# Patient Record
Sex: Female | Born: 1985 | Race: Black or African American | Hispanic: No | Marital: Single | Smoking: Never smoker
Health system: Southern US, Community
[De-identification: ages and names within clinical notes are randomized; demographics above are authoritative.]

## PROBLEM LIST (undated history)

## (undated) DIAGNOSIS — D649 Anemia, unspecified: Secondary | ICD-10-CM

## (undated) DIAGNOSIS — M0820 Juvenile rheumatoid arthritis with systemic onset, unspecified site: Secondary | ICD-10-CM

## (undated) DIAGNOSIS — M069 Rheumatoid arthritis, unspecified: Secondary | ICD-10-CM

## (undated) HISTORY — PX: NO PAST SURGERIES: SHX2092

---

## 2005-11-19 ENCOUNTER — Emergency Department (HOSPITAL_COMMUNITY): Admission: EM | Admit: 2005-11-19 | Discharge: 2005-11-19 | Payer: Self-pay | Admitting: Plastic Surgery
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
## 2016-02-19 LAB — BASIC METABOLIC PANEL: Glucose: 128 mg/dL

## 2019-10-02 DIAGNOSIS — Z20828 Contact with and (suspected) exposure to other viral communicable diseases: Secondary | ICD-10-CM | POA: Diagnosis not present

## 2021-10-04 ENCOUNTER — Encounter (HOSPITAL_BASED_OUTPATIENT_CLINIC_OR_DEPARTMENT_OTHER): Payer: Self-pay | Admitting: Emergency Medicine

## 2021-10-04 ENCOUNTER — Other Ambulatory Visit: Payer: Self-pay

## 2021-10-04 ENCOUNTER — Emergency Department (HOSPITAL_BASED_OUTPATIENT_CLINIC_OR_DEPARTMENT_OTHER)
Admission: EM | Admit: 2021-10-04 | Discharge: 2021-10-04 | Disposition: A | Discharge status: Home / Self Care | Payer: Medicaid Other | Attending: Emergency Medicine | Admitting: Emergency Medicine

## 2021-10-04 DIAGNOSIS — Z20822 Contact with and (suspected) exposure to covid-19: Secondary | ICD-10-CM | POA: Insufficient documentation

## 2021-10-04 DIAGNOSIS — Z3201 Encounter for pregnancy test, result positive: Secondary | ICD-10-CM

## 2021-10-04 DIAGNOSIS — O26891 Other specified pregnancy related conditions, first trimester: Secondary | ICD-10-CM | POA: Diagnosis not present

## 2021-10-04 DIAGNOSIS — O219 Vomiting of pregnancy, unspecified: Secondary | ICD-10-CM | POA: Diagnosis present

## 2021-10-04 DIAGNOSIS — Z3A00 Weeks of gestation of pregnancy not specified: Secondary | ICD-10-CM | POA: Insufficient documentation

## 2021-10-04 DIAGNOSIS — R197 Diarrhea, unspecified: Secondary | ICD-10-CM | POA: Diagnosis not present

## 2021-10-04 DIAGNOSIS — R112 Nausea with vomiting, unspecified: Secondary | ICD-10-CM

## 2021-10-04 LAB — COMPREHENSIVE METABOLIC PANEL
ALT: 11 U/L (ref 0–44)
AST: 20 U/L (ref 15–41)
Albumin: 4.7 g/dL (ref 3.5–5.0)
Alkaline Phosphatase: 55 U/L (ref 38–126)
Anion gap: 11 (ref 5–15)
BUN: 8 mg/dL (ref 6–20)
CO2: 25 mmol/L (ref 22–32)
Calcium: 10.2 mg/dL (ref 8.9–10.3)
Chloride: 100 mmol/L (ref 98–111)
Creatinine, Ser: 0.69 mg/dL (ref 0.44–1.00)
GFR, Estimated: >60 mL/min (ref >60)
Glucose, Bld: 83 mg/dL (ref 70–99)
Potassium: 4 mmol/L (ref 3.5–5.1)
Sodium: 136 mmol/L (ref 135–145)
Total Bilirubin: 0.4 mg/dL (ref 0.3–1.2)
Total Protein: 8.4 g/dL — ABNORMAL HIGH (ref 6.5–8.1)

## 2021-10-04 LAB — URINALYSIS, ROUTINE W REFLEX MICROSCOPIC
Bilirubin Urine: NEGATIVE
Glucose, UA: NEGATIVE mg/dL
Hgb urine dipstick: NEGATIVE
Ketones, ur: >80 mg/dL — AB
Nitrite: NEGATIVE
Protein, ur: 30 mg/dL — AB
Specific Gravity, Urine: 1.034 — ABNORMAL HIGH (ref 1.005–1.030)
pH: 6 (ref 5.0–8.0)

## 2021-10-04 LAB — CBC
HCT: 40.2 % (ref 36.0–46.0)
Hemoglobin: 13.7 g/dL (ref 12.0–15.0)
MCH: 28 pg (ref 26.0–34.0)
MCHC: 34.1 g/dL (ref 30.0–36.0)
MCV: 82 fL (ref 80.0–100.0)
Platelets: 286 10*3/uL (ref 150–400)
RBC: 4.9 MIL/uL (ref 3.87–5.11)
RDW: 14.9 % (ref 11.5–15.5)
WBC: 9.7 10*3/uL (ref 4.0–10.5)
nRBC: 0 % (ref 0.0–0.2)

## 2021-10-04 LAB — RESP PANEL BY RT-PCR (FLU A&B, COVID) ARPGX2
Influenza A by PCR: NEGATIVE
Influenza B by PCR: NEGATIVE
SARS Coronavirus 2 by RT PCR: NEGATIVE

## 2021-10-04 LAB — PREGNANCY, URINE: Preg Test, Ur: POSITIVE — AB

## 2021-10-04 LAB — LIPASE, BLOOD: Lipase: <10 U/L — ABNORMAL LOW (ref 11–51)

## 2021-10-04 MED ORDER — ONDANSETRON HCL 4 MG/2ML IJ SOLN
4.0000 mg | Freq: Once | INTRAMUSCULAR | Status: AC
  Administered 2021-10-04: 4 mg via INTRAVENOUS
  Filled 2021-10-04: qty 2

## 2021-10-04 MED ORDER — LACTATED RINGERS IV BOLUS
1000.0000 mL | Freq: Once | INTRAVENOUS | Status: AC
  Administered 2021-10-04: 1000 mL via INTRAVENOUS

## 2021-10-04 MED ORDER — ONDANSETRON 4 MG PO TBDP: 4.0000 mg | ORAL_TABLET | Freq: Three times a day (TID) | ORAL | 0 refills | Status: DC | PRN

## 2021-10-04 MED ORDER — ACETAMINOPHEN 325 MG PO TABS
650.0000 mg | ORAL_TABLET | Freq: Once | ORAL | Status: AC
Start: 2021-10-04 — End: 2021-10-04
  Administered 2021-10-04: 650 mg via ORAL
  Filled 2021-10-04: qty 2

## 2021-10-04 NOTE — Discharge Instructions (Addendum)
Use zofran as needed for nausea/vomiting Make sure you stay well hydrated with water Use tylenol as needed for pain. Avoid medicines such as ibuprofen in pregnancy You should establish care with an ob/gyn. You may re-establish with your provider in [place] or contact the clinic listed below to establish care.  Return to the ER if you develop abdominal pain, vaginal bleeding, or inability to keep fluids down despite medication. Return with any new, worsening, or concerning symptoms.

## 2021-10-04 NOTE — ED Notes (Signed)
Pt was given Ginger-ale for PO challenge.

## 2021-10-04 NOTE — ED Triage Notes (Signed)
Pt arrived POV, caox4, ambulatory. Pt c/o N/V, diarrhea since yesterday, pt also c/o headache. Pt reports son had stomach virus with diarrhea and vomiting last week.

## 2021-10-04 NOTE — ED Notes (Signed)
Pt was able to tolerate po ginger ale

## 2021-10-04 NOTE — ED Provider Notes (Signed)
[facility] EMERGENCY DEPT Provider Note   CSN: [id number] Arrival date & time: 10/04/21  1603     History  Chief Complaint  Patient presents with   Emesis    Melissa Little is a 36 y.o. female presenting for evaluation of nausea, vomiting, diarrhea, and headache.   Pt states there has been a stomach bug going around her family. Yesterday she developed n/v/d. She reports ~10 episodes of diarrhea in the last 24 hrs. She has not vomited since 4am. She denies fevers, cough, nasal congestion, abd pain, urinary sxs. She has not taken anything for her sxs. She developed a HA today, prompting her visit. She has not had anything to eat/drink since yesterday.  She is sexually active. Her LMP was ~1 month ago. She denies vaginal bleeding, lower abd pain, or vaginal discharge.   tr       Home Medications Prior to Admission medications   Medication Sig Start Date End Date Taking? Authorizing Provider  ondansetron (ZOFRAN-ODT) 4 MG disintegrating tablet Take 1 tablet (4 mg total) by mouth every 8 (eight) hours as needed for nausea or vomiting. 10/04/21  Yes Govani Radloff, PA-C      Allergies    Patient has no known allergies.    Review of Systems   Review of Systems  Constitutional:  Negative for fever.  Gastrointestinal:  Positive for diarrhea, nausea and vomiting. Negative for abdominal pain.  Genitourinary:  Negative for dysuria, frequency, pelvic pain, vaginal bleeding, vaginal discharge and vaginal pain.  Neurological:  Positive for headaches.  All other systems reviewed and are negative.   Physical Exam Updated Vital Signs BP 116/62   Pulse 79   Temp (!) 97.5 F (36.4 C)   Resp 18   Ht 5\' 3"  (1.6 m)   Wt 58.1 kg   LMP 09/07/2021 (Approximate)   SpO2 100%   BMI 22.67 kg/m  Physical Exam Vitals and nursing note reviewed.  Constitutional:      General: She is not in acute distress.    Appearance: Normal appearance.     Comments: Resting in the bed in  no acute distress  HENT:     Head: Normocephalic and atraumatic.     Mouth/Throat:     Mouth: Mucous membranes are moist.  Eyes:     Extraocular Movements: Extraocular movements intact.     Conjunctiva/sclera: Conjunctivae normal.     Pupils: Pupils are equal, round, and reactive to light.  Cardiovascular:     Rate and Rhythm: Normal rate and regular rhythm.     Pulses: Normal pulses.  Pulmonary:     Effort: Pulmonary effort is normal. No respiratory distress.     Breath sounds: Normal breath sounds. No wheezing.     Comments: Speaking in full sentences.  Clear lung sounds in all fields. Abdominal:     General: There is no distension.     Palpations: Abdomen is soft. There is no mass.     Tenderness: There is no abdominal tenderness. There is no guarding or rebound.     Comments: No ttp of the abd. No rigdity, guarding, distention   Musculoskeletal:        General: Normal range of motion.     Cervical back: Normal range of motion and neck supple.  Skin:    General: Skin is warm and dry.     Capillary Refill: Capillary refill takes less than 2 seconds.  Neurological:     Mental Status: She is alert and oriented  to person, place, and time.  Psychiatric:        Mood and Affect: Mood and affect normal.        Speech: Speech normal.        Behavior: Behavior normal.     ED Results / Procedures / Treatments   Labs (all labs ordered are listed, but only abnormal results are displayed) Labs Reviewed  LIPASE, BLOOD - Abnormal; Notable for the following components:      Result Value   Lipase <10 (*)    All other components within normal limits  COMPREHENSIVE METABOLIC PANEL - Abnormal; Notable for the following components:   Total Protein 8.4 (*)    All other components within normal limits  URINALYSIS, ROUTINE W REFLEX MICROSCOPIC - Abnormal; Notable for the following components:   Specific Gravity, Urine 1.034 (*)    Ketones, ur >80 (*)    Protein, ur 30 (*)     Leukocytes,Ua SMALL (*)    All other components within normal limits  PREGNANCY, URINE - Abnormal; Notable for the following components:   Preg Test, Ur POSITIVE (*)    All other components within normal limits  RESP PANEL BY RT-PCR (FLU A&B, COVID) [id number]  URINE CULTURE  CBC    EKG None  Radiology No results found.  Procedures Procedures    Medications Ordered in ED Medications  ondansetron (ZOFRAN) injection 4 mg (4 mg Intravenous Given 10/04/21 1740)  lactated ringers bolus 1,000 mL (0 mLs Intravenous Stopped 10/04/21 1847)  acetaminophen (TYLENOL) tablet 650 mg (650 mg Oral Given 10/04/21 1839)    ED Course/ Medical Decision Making/ A&P                           Medical Decision Making Amount and/or Complexity of Data Reviewed Labs: ordered.  Risk OTC drugs. Prescription drug management.    This patient presents to the ED for concern of n/v/d. This involves an extensive number of treatment options, and is a complaint that carries with it a high risk of complications and morbidity.  The differential diagnosis includes gastroenteritis, pancreatitis, cholelithiasis, UTI, pregnancy   Co morbidities:  none    Lab Tests:  I ordered, and personally interpreted labs.  The pertinent results include:  urine pregnancy is positive. Pt informed fo this result. This is likely contributing to her sxs today.  Overall remaining labs are reassuring. No concerning kidney, liver, or pancreatic dysfunction. UA without obvious infection, and pt without sxs. However considering current pregnancy, will send for urine cx. Covid and flu negative  Medicines ordered:  I ordered medication including Zofran and fluids for symptomatic control Reevaluation of the patient after these medicines showed that the patient improved I have reviewed the patients home medicines and have made adjustments as needed   Test Considered:  no [place] ordered today as pt without sxs concerning for  miscarriage, ectopic or other pregnancy related emergencies.  No need for CT, MRI, or [place] of abd as pt is without fever or abd pain that could indicate intraabdominal infection or surgical emergency.   Reevaluation:  After the interventions noted above, I reevaluated the patient and found that they have :improved. She is able to tolerate fluids without difficulty    Disposition:  After consideration of the diagnostic results and the patients response to treatment, I feel that the patent would benefit from continue symptomatic tx at home. Discussed use of tylenol for headache, zofran prn for nausea.  Encourage pt to establish with ob/gyn, resources given. At this time, pt appears safe for d/c. Return precautions given. Pt states she understands and agrees to plan.          Final Clinical Impression(s) / ED Diagnoses Final diagnoses:  Nausea vomiting and diarrhea  Positive pregnancy test    Rx / DC Orders ED Discharge Orders          Ordered    ondansetron (ZOFRAN-ODT) 4 MG disintegrating tablet  Every 8 hours PRN        10/04/21 1917              Franchot Heidelberg, PA-C 10/04/21 2008    Wyvonnia Dusky, MD 10/05/21 [phone]

## 2021-10-06 LAB — URINE CULTURE: Culture: NO GROWTH

## 2022-01-18 NOTE — L&D Delivery Note (Addendum)
OB/GYN Faculty Practice Delivery Note  Melissa Little is a 37 y.o. [id number] s/p SVD at [redacted]w[redacted]d. She was admitted for marginal placental abruption.   ROM: rupture date, rupture time, delivery date, or delivery time have not been documented with blood tinged fluid GBS Status: unknown but on PPX   Maximum Maternal Temperature: 98.31F  Labor Progress: Initial SVE: 4/60/-2. She then progressed to complete.   Delivery Date/Time: 1556 11/14 Delivery: Called to room and patient was complete and pushing. Head delivered DOA and as the head was delivering there was a gush of port wine blood stained fluid, presumably SROM and abruption products. No nuchal cord present. Shoulder and body delivered in usual fashion. Infant with spontaneous cry, placed on mother's abdomen, dried and stimulated. Cord clamped x 2 after 1-minute delay, and cut by Clinical research associate. Cord blood drawn. Placenta delivered spontaneously with gentle cord traction and inspected and found to have the following: 3 vessel cord but two distinct 3cm areas close to the margin of adherent clot. Fundus firm with massage, TXA, and Pitocin, noted ot have dark clot expelled with fundal massage. A lower uterine sweep preformed with minimal clot, however a 5" piece of trailing membrane also removed by using fingers to bring the membrane to introitus and then ring forceps to evacuate the membranes further.  A second lower uterine sweep preformed with no other clot or retained products appreciated. Labia, perineum, vagina, and cervix inspected with hemostatic first degree perineal laceration. Mom and baby doing well.  Mom confirms she still wants PP BTL for contraception, will work with OR staff to coordinate.  Last PO intake 11/13 and jello.    Baby Weight: pending  Placenta: 3 vessel, intact. Sent to Pathology.  Complications: None Lacerations: as above EBL: 373 mL Analgesia: Epidural   Infant:  APGAR (1 MIN):  8 APGAR (5 MINS):  9  Melissa Dibble, MD [facility]  Medicine Fellow, [facility] for [facility], [facility] 12/02/2022, 4:19 PM

## 2022-03-15 DIAGNOSIS — F419 Anxiety disorder, unspecified: Secondary | ICD-10-CM | POA: Insufficient documentation

## 2022-06-30 ENCOUNTER — Encounter (HOSPITAL_COMMUNITY): Payer: Self-pay

## 2022-06-30 ENCOUNTER — Other Ambulatory Visit: Payer: Self-pay

## 2022-06-30 ENCOUNTER — Emergency Department (HOSPITAL_COMMUNITY): Payer: Medicaid Other

## 2022-06-30 ENCOUNTER — Emergency Department (HOSPITAL_COMMUNITY)
Admission: EM | Admit: 2022-06-30 | Discharge: 2022-06-30 | Disposition: A | Discharge status: Home / Self Care | Payer: Medicaid Other | Attending: Emergency Medicine | Admitting: Emergency Medicine

## 2022-06-30 DIAGNOSIS — Z20822 Contact with and (suspected) exposure to covid-19: Secondary | ICD-10-CM | POA: Insufficient documentation

## 2022-06-30 DIAGNOSIS — B9689 Other specified bacterial agents as the cause of diseases classified elsewhere: Secondary | ICD-10-CM | POA: Insufficient documentation

## 2022-06-30 DIAGNOSIS — E876 Hypokalemia: Secondary | ICD-10-CM

## 2022-06-30 DIAGNOSIS — O2341 Unspecified infection of urinary tract in pregnancy, first trimester: Secondary | ICD-10-CM | POA: Insufficient documentation

## 2022-06-30 DIAGNOSIS — M791 Myalgia, unspecified site: Secondary | ICD-10-CM | POA: Diagnosis not present

## 2022-06-30 DIAGNOSIS — Z3A13 13 weeks gestation of pregnancy: Secondary | ICD-10-CM | POA: Insufficient documentation

## 2022-06-30 DIAGNOSIS — Z3490 Encounter for supervision of normal pregnancy, unspecified, unspecified trimester: Secondary | ICD-10-CM

## 2022-06-30 DIAGNOSIS — O2340 Unspecified infection of urinary tract in pregnancy, unspecified trimester: Secondary | ICD-10-CM

## 2022-06-30 DIAGNOSIS — O26891 Other specified pregnancy related conditions, first trimester: Secondary | ICD-10-CM | POA: Diagnosis present

## 2022-06-30 LAB — COMPREHENSIVE METABOLIC PANEL
ALT: 12 U/L (ref 0–44)
AST: 23 U/L (ref 15–41)
Albumin: 3.2 g/dL — ABNORMAL LOW (ref 3.5–5.0)
Alkaline Phosphatase: 47 U/L (ref 38–126)
Anion gap: 8 (ref 5–15)
BUN: 5 mg/dL — ABNORMAL LOW (ref 6–20)
CO2: 22 mmol/L (ref 22–32)
Calcium: 8.6 mg/dL — ABNORMAL LOW (ref 8.9–10.3)
Chloride: 99 mmol/L (ref 98–111)
Creatinine, Ser: 0.46 mg/dL (ref 0.44–1.00)
GFR, Estimated: >60 mL/min (ref >60)
Glucose, Bld: 87 mg/dL (ref 70–99)
Potassium: 2.9 mmol/L — ABNORMAL LOW (ref 3.5–5.1)
Sodium: 129 mmol/L — ABNORMAL LOW (ref 135–145)
Total Bilirubin: 0.7 mg/dL (ref 0.3–1.2)
Total Protein: 7.3 g/dL (ref 6.5–8.1)

## 2022-06-30 LAB — I-STAT BETA HCG BLOOD, ED (MC, WL, AP ONLY): I-stat hCG, quantitative: >2000 m[IU]/mL — ABNORMAL HIGH (ref <5)

## 2022-06-30 LAB — URINALYSIS, ROUTINE W REFLEX MICROSCOPIC
Bilirubin Urine: NEGATIVE
Glucose, UA: >=500 mg/dL — AB
Ketones, ur: 20 mg/dL — AB
Nitrite: NEGATIVE
Protein, ur: 30 mg/dL — AB
Specific Gravity, Urine: 1.02 (ref 1.005–1.030)
pH: 5 (ref 5.0–8.0)

## 2022-06-30 LAB — CBC WITH DIFFERENTIAL/PLATELET
Abs Immature Granulocytes: 0.06 10*3/uL (ref 0.00–0.07)
Basophils Absolute: 0.1 10*3/uL (ref 0.0–0.1)
Basophils Relative: 1 %
Eosinophils Absolute: 0 10*3/uL (ref 0.0–0.5)
Eosinophils Relative: 0 %
HCT: 31.1 % — ABNORMAL LOW (ref 36.0–46.0)
Hemoglobin: 10.7 g/dL — ABNORMAL LOW (ref 12.0–15.0)
Immature Granulocytes: 1 %
Lymphocytes Relative: 20 %
Lymphs Abs: 1.1 10*3/uL (ref 0.7–4.0)
MCH: 27 pg (ref 26.0–34.0)
MCHC: 34.4 g/dL (ref 30.0–36.0)
MCV: 78.5 fL — ABNORMAL LOW (ref 80.0–100.0)
Monocytes Absolute: 0.7 10*3/uL (ref 0.1–1.0)
Monocytes Relative: 14 %
Neutro Abs: 3.4 10*3/uL (ref 1.7–7.7)
Neutrophils Relative %: 64 %
Platelets: 187 10*3/uL (ref 150–400)
RBC: 3.96 MIL/uL (ref 3.87–5.11)
RDW: 14.5 % (ref 11.5–15.5)
WBC: 5.3 10*3/uL (ref 4.0–10.5)
nRBC: 0 % (ref 0.0–0.2)

## 2022-06-30 LAB — HCG, QUANTITATIVE, PREGNANCY: hCG, Beta Chain, Quant, S: 47350 m[IU]/mL — ABNORMAL HIGH (ref <5)

## 2022-06-30 LAB — CBG MONITORING, ED: Glucose-Capillary: 102 mg/dL — ABNORMAL HIGH (ref 70–99)

## 2022-06-30 LAB — SARS CORONAVIRUS 2 BY RT PCR: SARS Coronavirus 2 by RT PCR: NEGATIVE

## 2022-06-30 MED ORDER — SODIUM CHLORIDE 0.9 % IV BOLUS
1000.0000 mL | Freq: Once | INTRAVENOUS | Status: AC
  Administered 2022-06-30: 1000 mL via INTRAVENOUS

## 2022-06-30 MED ORDER — DOXYLAMINE-PYRIDOXINE 10-10 MG PO TBEC: 2.0000 | DELAYED_RELEASE_TABLET | Freq: Every day | ORAL | 0 refills | Status: DC

## 2022-06-30 MED ORDER — ONDANSETRON HCL 4 MG/2ML IJ SOLN
4.0000 mg | Freq: Once | INTRAMUSCULAR | Status: AC
  Administered 2022-06-30: 4 mg via INTRAVENOUS
  Filled 2022-06-30: qty 2

## 2022-06-30 MED ORDER — ACETAMINOPHEN 325 MG PO TABS
650.0000 mg | ORAL_TABLET | Freq: Once | ORAL | Status: AC
  Administered 2022-06-30: 650 mg via ORAL
  Filled 2022-06-30: qty 2

## 2022-06-30 MED ORDER — POTASSIUM CHLORIDE CRYS ER 20 MEQ PO TBCR
40.0000 meq | EXTENDED_RELEASE_TABLET | Freq: Once | ORAL | Status: AC
  Administered 2022-06-30: 40 meq via ORAL
  Filled 2022-06-30: qty 2

## 2022-06-30 MED ORDER — CEFADROXIL 500 MG PO CAPS: 500.0000 mg | ORAL_CAPSULE | Freq: Two times a day (BID) | ORAL | 0 refills | Status: DC

## 2022-06-30 MED ORDER — SODIUM CHLORIDE 0.9 % IV SOLN
1.0000 g | Freq: Once | INTRAVENOUS | Status: AC
  Administered 2022-06-30: 1 g via INTRAVENOUS
  Filled 2022-06-30: qty 10

## 2022-06-30 NOTE — ED Notes (Signed)
Pt ambulatory to the restroom.  

## 2022-06-30 NOTE — ED Triage Notes (Signed)
Pt states she woke up today experiencing chills, body aches, and nausea/vomiting. Denies being around anyone sick that she is aware of.

## 2022-06-30 NOTE — ED Provider Notes (Signed)
Physical Exam  BP 109/65   Pulse 76   Temp 98.6 F (37 C) (Oral)   Resp 18   Ht 5\' 2"  (1.575 m)   Wt 54.9 kg   LMP 05/26/2022   SpO2 100%   BMI 22.13 kg/m   Physical Exam Vitals and nursing note reviewed.  Constitutional:      General: She is not in acute distress.    Appearance: She is well-developed.  HENT:     Head: Normocephalic and atraumatic.  Eyes:     Conjunctiva/sclera: Conjunctivae normal.  Cardiovascular:     Rate and Rhythm: Normal rate and regular rhythm.     Heart sounds: No murmur heard. Pulmonary:     Effort: Pulmonary effort is normal. No respiratory distress.     Breath sounds: Normal breath sounds.  Abdominal:     Palpations: Abdomen is soft.     Tenderness: There is no abdominal tenderness. There is no guarding.  Musculoskeletal:        General: No swelling.     Cervical back: Neck supple.  Skin:    General: Skin is warm and dry.     Capillary Refill: Capillary refill takes less than 2 seconds.  Neurological:     Mental Status: She is alert.  Psychiatric:        Mood and Affect: Mood normal.     Procedures  Procedures  ED Course / MDM    Medical Decision Making Amount and/or Complexity of Data Reviewed Labs: ordered. Radiology: ordered.  Risk OTC drugs. Prescription drug management.   37 year old female presents emergency department with complaints of nausea, vomiting.  Patient care handed off from [facility] at shift change, see prior note for more full details.  In short, awoke this morning with bodyaches, chills, 2 episodes of nonbloody emesis earlier today.  States her last menstrual period ended 5 to 6 weeks ago.  G3 P2 with positive at-home pregnancy test today.  Denies abdominal pain, fever, chills, urinary/vaginal symptoms, change in bowel habits.  Awaiting ultrasound imaging with expected discharge in the outpatient setting with follow-up with OB/GYN.  Laboratory studies: No leukocytosis.  Patient with evidence of anemia  microcytic in nature of 10.7 of which is decreased in patient's baseline.  No platelet abnormalities.  Patient with multiple electrolyte abnormalities including hyponatremia, hypokalemia and hypocalcemia of 129, 2.98.6 respectively supplemented via IV fluids and oral potassium chloride.  No transaminitis.  No renal dysfunction.  UA significant for urinary tract infection with rare bacteria, 11-20 RBCs with moderate leukocytes.  Beta-hCG elevated to 47,000, 350.  Imaging studies: Single live intrauterine pregnancy measuring 13 weeks and 5 days with no complication  Nausea, vomiting, intrauterine pregnancy Vital signs within normal range and stable throughout visit Laboratory/imaging studies: See above 37 year old female presents emergency department with nausea, vomiting that began this morning.  Patient without abdominal pain or tenderness on exam throughout visit.  Patient found with positive pregnancy test with intrauterine pregnancy confirmed on ultrasound imaging without complication.  Patient states that she has had "morning sickness" with her 2 prior pregnancies and states this feels similarly.  Was treated with Zofran while in the ED with subsequent passage of p.o. trial with no repeat bouts of emesis.  Plan to discharge with antiemetic as well as antibiotics given evidence of current urinary tract infection although asymptomatic at this time.  Will recommend prenatal vitamins and abstinence from substances/medications that can complicate pregnancy.  Close follow-up with OB/GYN recommended in outpatient setting for reevaluation.  Treatment plan discussed at length with patient and she acknowledged understanding was agreeable to said plan.  Patient overall well-appearing, afebrile in no acute distress. Worrisome signs and symptoms were discussed with the patient and the patient acknowledged understanding to return to emergency department if notice.  Patient stable upon discharge.          Peter Garter, [place] 06/30/22 2332    Gwyneth Sprout, MD 07/01/22 [phone]

## 2022-06-30 NOTE — ED Provider Notes (Signed)
[place] EMERGENCY DEPARTMENT AT [facility] Provider Note   CSN: [id number] Arrival date & time: 06/30/22  1759     History  Chief Complaint  Patient presents with   Nausea    Melissa Little is a 37 y.o. female.  37 year old female with no significant past medical history present with complaint of chills, body aches, nausea and vomiting x 2 episodes of nonbloody emesis today.  No known sick contacts, no recent travel.  LMP about 5 to 6 weeks ago.  Has not taken anything for her symptoms today. G3P2 (as of + test today).        Home Medications Prior to Admission medications   Medication Sig Start Date End Date Taking? Authorizing Provider  ondansetron (ZOFRAN-ODT) 4 MG disintegrating tablet Take 1 tablet (4 mg total) by mouth every 8 (eight) hours as needed for nausea or vomiting. 10/04/21   Caccavale, Sophia, PA-C      Allergies    Patient has no known allergies.    Review of Systems   Review of Systems Negative except as per HPI Physical Exam Updated Vital Signs BP (!) 116/91   Pulse 70   Temp 99.6 F (37.6 C) (Oral)   Resp 18   Ht 5\' 2"  (1.575 m)   Wt 54.9 kg   LMP 05/26/2022   SpO2 100%   BMI 22.13 kg/m  Physical Exam Vitals and nursing note reviewed.  Constitutional:      General: She is not in acute distress.    Appearance: She is well-developed. She is not diaphoretic.  HENT:     Head: Normocephalic and atraumatic.  Eyes:     Conjunctiva/sclera: Conjunctivae normal.  Cardiovascular:     Rate and Rhythm: Normal rate and regular rhythm.     Pulses: Normal pulses.     Heart sounds: Normal heart sounds.  Pulmonary:     Effort: Pulmonary effort is normal.     Breath sounds: Normal breath sounds.  Abdominal:     Palpations: Abdomen is soft.     Tenderness: There is no abdominal tenderness.  Musculoskeletal:     Cervical back: Neck supple.     Right lower leg: No edema.     Left lower leg: No edema.  Lymphadenopathy:     Cervical: No  cervical adenopathy.  Skin:    General: Skin is warm and dry.     Findings: No erythema or rash.  Neurological:     Mental Status: She is alert and oriented to person, place, and time.  Psychiatric:        Behavior: Behavior normal.     ED Results / Procedures / Treatments   Labs (all labs ordered are listed, but only abnormal results are displayed) Labs Reviewed  COMPREHENSIVE METABOLIC PANEL - Abnormal; Notable for the following components:      Result Value   Sodium 129 (*)    Potassium 2.9 (*)    BUN 5 (*)    Calcium 8.6 (*)    Albumin 3.2 (*)    All other components within normal limits  CBC WITH DIFFERENTIAL/PLATELET - Abnormal; Notable for the following components:   Hemoglobin 10.7 (*)    HCT 31.1 (*)    MCV 78.5 (*)    All other components within normal limits  URINALYSIS, ROUTINE W REFLEX MICROSCOPIC - Abnormal; Notable for the following components:   Glucose, UA >=500 (*)    Hgb urine dipstick MODERATE (*)    Ketones, ur 20 (*)  Protein, ur 30 (*)    Leukocytes,Ua MODERATE (*)    Bacteria, UA RARE (*)    All other components within normal limits  I-STAT BETA HCG BLOOD, ED ([facility], [facility], [facility] ONLY) - Abnormal; Notable for the following components:   I-stat hCG, quantitative >2,000.0 (*)    All other components within normal limits  CBG MONITORING, ED - Abnormal; Notable for the following components:   Glucose-Capillary 102 (*)    All other components within normal limits  SARS CORONAVIRUS 2 BY RT PCR  URINE CULTURE  HCG, QUANTITATIVE, PREGNANCY    EKG None  Radiology No results found.  Procedures Procedures    Medications Ordered in ED Medications  cefTRIAXone (ROCEPHIN) 1 g in sodium chloride 0.9 % 100 mL IVPB (has no administration in time range)  potassium chloride SA (KLOR-CON M) CR tablet 40 mEq (has no administration in time range)  sodium chloride 0.9 % bolus 1,000 mL (1,000 mLs Intravenous New Bag/Given 06/30/22 2055)  ondansetron (ZOFRAN)  injection 4 mg (4 mg Intravenous Given 06/30/22 2054)  acetaminophen (TYLENOL) tablet 650 mg (650 mg Oral Given 06/30/22 2055)    ED Course/ Medical Decision Making/ A&P                             Medical Decision Making Amount and/or Complexity of Data Reviewed Labs: ordered.  Risk OTC drugs. Prescription drug management.   This patient presents to the ED for concern of chills, body aches, vomiting, this involves an extensive number of treatment options, and is a complaint that carries with it a high risk of complications and morbidity.  The differential diagnosis includes but not limited to viral illness, UTI, pregnancy   Co morbidities that complicate the patient evaluation  Otherwise healthy    Additional history obtained:  External records from outside source obtained and reviewed including prior labs in care everywhere    Lab Tests:  I Ordered, and personally interpreted labs.  The pertinent results include:  I stat Hcg +, formal pending. CBC with normal WBC, hgb 10.7 previously 13.7 8 months ago. Urine with moderate hgb, >500 glucose, leukocytes and rare bacteria. Add on CBG with concern for glucose in the urine is reassuring at 109.    Imaging Studies ordered:  I ordered imaging studies including OB US  Results pending at time of sign out to on coming provider.    Problem List / ED Course / Critical interventions / Medication management  37 yo female with chills, nausea, vomiting, body aches. No abdominal pain, has  been constipated. Found to be pregnant, concern for UTI, treated with rocephin in the ER, likely dc on Keflex. Care signed out at change or shift pending OB US and recheck.  I ordered medication including tylenol, IVF  for body aches, temp 99.7.  Reevaluation of the patient after these medicines showed that the patient  reassessment pending at time of sign out I have reviewed the patients home medicines and have made adjustments as needed   Social  Determinants of Health:  Has PCP   Test / Admission - Considered:  Disposition pending at time of sign out to oncoming provider.          Final Clinical Impression(s) / ED Diagnoses Final diagnoses:  Pregnancy, unspecified gestational age  Urinary tract infection in mother during pregnancy, antepartum  Hypokalemia    Rx / DC Orders ED Discharge Orders     None  Jeannie Fend, PA-C 06/30/22 2155    Gwyneth Sprout, MD 07/01/22 [phone]

## 2022-06-30 NOTE — Discharge Instructions (Addendum)
Note the workup today was overall reassuring.  As discussed, ultrasound findings showed single live intrauterine pregnancy measuring 13 weeks and 5 days without complication.  Your urine did show signs of infection so we will treat this with antibiotics in outpatient setting.  Recommend continuing prenatal vitamins and avoiding medications/foods that can complicate pregnancy.  Recommend close follow-up with OB/GYN in the outpatient setting for reevaluation of symptoms.  Please not hesitate to return to emergency department for worrisome signs and symptoms we discussed become apparent.

## 2022-07-01 LAB — URINE CULTURE: Culture: <10000 — AB

## 2022-07-02 ENCOUNTER — Other Ambulatory Visit (HOSPITAL_BASED_OUTPATIENT_CLINIC_OR_DEPARTMENT_OTHER): Payer: Self-pay | Admitting: Nurse Practitioner

## 2022-07-02 DIAGNOSIS — R590 Localized enlarged lymph nodes: Secondary | ICD-10-CM

## 2022-07-06 ENCOUNTER — Ambulatory Visit (HOSPITAL_BASED_OUTPATIENT_CLINIC_OR_DEPARTMENT_OTHER)
Admission: RE | Admit: 2022-07-06 | Discharge: 2022-07-06 | Disposition: A | Discharge status: Home / Self Care | Payer: Medicaid Other | Source: Ambulatory Visit | Attending: Nurse Practitioner | Admitting: Nurse Practitioner

## 2022-07-06 DIAGNOSIS — R590 Localized enlarged lymph nodes: Secondary | ICD-10-CM | POA: Diagnosis present

## 2022-08-05 ENCOUNTER — Telehealth (INDEPENDENT_AMBULATORY_CARE_PROVIDER_SITE_OTHER): Payer: Medicaid Other

## 2022-08-05 DIAGNOSIS — Z3689 Encounter for other specified antenatal screening: Secondary | ICD-10-CM

## 2022-08-05 DIAGNOSIS — O09529 Supervision of elderly multigravida, unspecified trimester: Secondary | ICD-10-CM | POA: Insufficient documentation

## 2022-08-05 DIAGNOSIS — O0990 Supervision of high risk pregnancy, unspecified, unspecified trimester: Secondary | ICD-10-CM | POA: Insufficient documentation

## 2022-08-05 NOTE — Progress Notes (Signed)
New OB Intake  I connected with Melissa Little  on 08/06/22 at  9:15 AM EDT by MyChart Video Visit and verified that I am speaking with the correct person using two identifiers. Nurse is located at [facility] and pt is located at home.  I discussed the limitations, risks, security and privacy concerns of performing an evaluation and management service by telephone and the availability of in person appointments. I also discussed with the patient that there may be a patient responsible charge related to this service. The patient expressed understanding and agreed to proceed.  I explained I am completing New OB Intake today. We discussed EDD of Not found.. Pt is [id number]. I reviewed her allergies, medications and Medical/Surgical/OB history.    Patient Active Problem List   Diagnosis Date Noted   Supervision of high risk pregnancy, antepartum 08/05/2022   AMA (advanced maternal age) multigravida 35+ 08/05/2022   Anxiety 03/15/2022   Concerns addressed today None  Delivery Plans Plans to deliver at [facility]. Discussed the nature of our practice with multiple providers including residents and students. Due to the size of the practice, the delivering provider may not be the same as those providing prenatal care.   Patient is not interested in water birth.  MyChart MyChart access verified. I explained pt will have some visits in office and some virtually.   Blood Pressure Cuff Needs follow up at first visit.  Anatomy US Explained [place] will be scheduled next available. Appt scheduled 08/17/22 at 12:30 PM.  Is patient a CenteringPregnancy candidate?  Considering   Is patient a Mom+Baby Combined Care candidate?  Not a candidate  First visit review I reviewed new OB appt with patient. Explained pt will be seen by Tinnie Gens, MD at first visit. Discussed Avelina Laine genetic screening with patient.   Last Pap None recently; okay to complete for at first visit.  Marjo Bicker, RN 08/06/2022   12:46 PM

## 2022-08-12 ENCOUNTER — Ambulatory Visit (INDEPENDENT_AMBULATORY_CARE_PROVIDER_SITE_OTHER): Payer: Medicaid Other | Admitting: Family Medicine

## 2022-08-12 ENCOUNTER — Inpatient Hospital Stay (HOSPITAL_COMMUNITY)
Admission: AD | Admit: 2022-08-12 | Discharge: 2022-08-12 | Disposition: A | Discharge status: Home / Self Care | Payer: Medicaid Other | Attending: Obstetrics & Gynecology | Admitting: Obstetrics & Gynecology

## 2022-08-12 ENCOUNTER — Inpatient Hospital Stay (HOSPITAL_COMMUNITY): Payer: Medicaid Other

## 2022-08-12 ENCOUNTER — Encounter: Payer: Self-pay | Admitting: Family Medicine

## 2022-08-12 ENCOUNTER — Other Ambulatory Visit: Payer: Self-pay | Admitting: Obstetrics and Gynecology

## 2022-08-12 ENCOUNTER — Other Ambulatory Visit: Payer: Self-pay

## 2022-08-12 VITALS — BP 118/54 | HR 122 | Temp 100.4°F | Wt 127.9 lb

## 2022-08-12 DIAGNOSIS — O99019 Anemia complicating pregnancy, unspecified trimester: Secondary | ICD-10-CM

## 2022-08-12 DIAGNOSIS — O26892 Other specified pregnancy related conditions, second trimester: Secondary | ICD-10-CM | POA: Diagnosis not present

## 2022-08-12 DIAGNOSIS — F419 Anxiety disorder, unspecified: Secondary | ICD-10-CM | POA: Diagnosis not present

## 2022-08-12 DIAGNOSIS — O99112 Other diseases of the blood and blood-forming organs and certain disorders involving the immune mechanism complicating pregnancy, second trimester: Secondary | ICD-10-CM | POA: Insufficient documentation

## 2022-08-12 DIAGNOSIS — O09522 Supervision of elderly multigravida, second trimester: Secondary | ICD-10-CM | POA: Diagnosis not present

## 2022-08-12 DIAGNOSIS — R319 Hematuria, unspecified: Secondary | ICD-10-CM | POA: Insufficient documentation

## 2022-08-12 DIAGNOSIS — D72829 Elevated white blood cell count, unspecified: Secondary | ICD-10-CM | POA: Diagnosis not present

## 2022-08-12 DIAGNOSIS — O0992 Supervision of high risk pregnancy, unspecified, second trimester: Secondary | ICD-10-CM

## 2022-08-12 DIAGNOSIS — O99012 Anemia complicating pregnancy, second trimester: Secondary | ICD-10-CM | POA: Insufficient documentation

## 2022-08-12 DIAGNOSIS — Z20822 Contact with and (suspected) exposure to covid-19: Secondary | ICD-10-CM | POA: Diagnosis not present

## 2022-08-12 DIAGNOSIS — R509 Fever, unspecified: Secondary | ICD-10-CM | POA: Diagnosis not present

## 2022-08-12 DIAGNOSIS — O210 Mild hyperemesis gravidarum: Secondary | ICD-10-CM | POA: Diagnosis present

## 2022-08-12 DIAGNOSIS — R7401 Elevation of levels of liver transaminase levels: Secondary | ICD-10-CM | POA: Diagnosis not present

## 2022-08-12 DIAGNOSIS — O0990 Supervision of high risk pregnancy, unspecified, unspecified trimester: Secondary | ICD-10-CM

## 2022-08-12 DIAGNOSIS — Z3A19 19 weeks gestation of pregnancy: Secondary | ICD-10-CM | POA: Diagnosis not present

## 2022-08-12 DIAGNOSIS — O219 Vomiting of pregnancy, unspecified: Secondary | ICD-10-CM

## 2022-08-12 LAB — RESP PANEL BY RT-PCR (RSV, FLU A&B, COVID)  RVPGX2
Influenza A by PCR: NEGATIVE
Influenza B by PCR: NEGATIVE
Resp Syncytial Virus by PCR: NEGATIVE
SARS Coronavirus 2 by RT PCR: NEGATIVE

## 2022-08-12 LAB — CBC WITH DIFFERENTIAL/PLATELET
Basophils Absolute: 0 10*3/uL (ref 0.0–0.1)
Basophils Relative: 0 %
Eosinophils Absolute: 0.2 10*3/uL (ref 0.0–0.5)
Eosinophils Relative: 1 %
HCT: 27.3 % — ABNORMAL LOW (ref 36.0–46.0)
Hemoglobin: 9 g/dL — ABNORMAL LOW (ref 12.0–15.0)
Lymphocytes Relative: 0 %
Lymphs Abs: 0 10*3/uL — ABNORMAL LOW (ref 0.7–4.0)
MCH: 25.2 pg — ABNORMAL LOW (ref 26.0–34.0)
MCHC: 33 g/dL (ref 30.0–36.0)
MCV: 76.5 fL — ABNORMAL LOW (ref 80.0–100.0)
Monocytes Absolute: 0 10*3/uL — ABNORMAL LOW (ref 0.1–1.0)
Monocytes Relative: 0 %
Neutro Abs: 22.2 10*3/uL — ABNORMAL HIGH (ref 1.7–7.7)
Neutrophils Relative %: 99 %
Platelets: 164 10*3/uL (ref 150–400)
RBC: 3.57 MIL/uL — ABNORMAL LOW (ref 3.87–5.11)
RDW: 15.9 % — ABNORMAL HIGH (ref 11.5–15.5)
WBC: 22.4 10*3/uL — ABNORMAL HIGH (ref 4.0–10.5)
nRBC: 0.1 % (ref 0.0–0.2)

## 2022-08-12 LAB — COMPREHENSIVE METABOLIC PANEL
ALT: 9 U/L (ref 0–44)
AST: 158 U/L — ABNORMAL HIGH (ref 15–41)
Albumin: 2.4 g/dL — ABNORMAL LOW (ref 3.5–5.0)
Alkaline Phosphatase: 104 U/L (ref 38–126)
Anion gap: 16 — ABNORMAL HIGH (ref 5–15)
BUN: 6 mg/dL (ref 6–20)
CO2: 21 mmol/L — ABNORMAL LOW (ref 22–32)
Calcium: 8.5 mg/dL — ABNORMAL LOW (ref 8.9–10.3)
Chloride: 90 mmol/L — ABNORMAL LOW (ref 98–111)
Creatinine, Ser: 0.49 mg/dL (ref 0.44–1.00)
GFR, Estimated: >60 mL/min (ref >60)
Glucose, Bld: 103 mg/dL — ABNORMAL HIGH (ref 70–99)
Potassium: 2.9 mmol/L — ABNORMAL LOW (ref 3.5–5.1)
Sodium: 127 mmol/L — ABNORMAL LOW (ref 135–145)
Total Bilirubin: 0.6 mg/dL (ref 0.3–1.2)
Total Protein: 6.6 g/dL (ref 6.5–8.1)

## 2022-08-12 LAB — URINALYSIS, ROUTINE W REFLEX MICROSCOPIC
Bilirubin Urine: NEGATIVE
Glucose, UA: NEGATIVE mg/dL
Ketones, ur: 20 mg/dL — AB
Leukocytes,Ua: NEGATIVE
Nitrite: NEGATIVE
Protein, ur: 100 mg/dL — AB
Specific Gravity, Urine: 1.029 (ref 1.005–1.030)
pH: 5 (ref 5.0–8.0)

## 2022-08-12 LAB — POCT URINALYSIS DIP (DEVICE)
Glucose, UA: NEGATIVE mg/dL
Ketones, ur: 80 mg/dL — AB
Leukocytes,Ua: NEGATIVE
Nitrite: NEGATIVE
Protein, ur: 100 mg/dL — AB
Specific Gravity, Urine: >=1.03 (ref 1.005–1.030)
Urobilinogen, UA: >=8 mg/dL (ref 0.0–1.0)
pH: 6 (ref 5.0–8.0)

## 2022-08-12 LAB — LACTIC ACID, PLASMA: Lactic Acid, Venous: 1.6 mmol/L (ref 0.5–1.9)

## 2022-08-12 MED ORDER — CEFADROXIL 500 MG PO CAPS: 500.0000 mg | ORAL_CAPSULE | Freq: Two times a day (BID) | ORAL | 0 refills | Status: DC

## 2022-08-12 MED ORDER — SODIUM CHLORIDE 0.9 % IV SOLN: INTRAVENOUS | Status: DC

## 2022-08-12 MED ORDER — LACTATED RINGERS IV BOLUS
1000.0000 mL | Freq: Once | INTRAVENOUS | Status: AC
  Administered 2022-08-12: 1000 mL via INTRAVENOUS

## 2022-08-12 MED ORDER — ONDANSETRON HCL 8 MG PO TABS: 8.0000 mg | ORAL_TABLET | Freq: Three times a day (TID) | ORAL | 0 refills | Status: DC | PRN

## 2022-08-12 MED ORDER — SCOPOLAMINE 1 MG/3DAYS TD PT72
1.0000 | MEDICATED_PATCH | TRANSDERMAL | Status: DC
  Administered 2022-08-12: 1.5 mg via TRANSDERMAL
  Filled 2022-08-12: qty 1

## 2022-08-12 MED ORDER — IRON 325 (65 FE) MG PO TABS: 1.0000 | ORAL_TABLET | ORAL | 1 refills | Status: DC

## 2022-08-12 MED ORDER — ACETAMINOPHEN 500 MG PO TABS
1000.0000 mg | ORAL_TABLET | Freq: Once | ORAL | Status: AC
  Administered 2022-08-12: 1000 mg via ORAL
  Filled 2022-08-12: qty 2

## 2022-08-12 MED ORDER — FAMOTIDINE IN NACL 20-0.9 MG/50ML-% IV SOLN
20.0000 mg | Freq: Once | INTRAVENOUS | Status: AC
  Administered 2022-08-12: 20 mg via INTRAVENOUS
  Filled 2022-08-12: qty 50

## 2022-08-12 MED ORDER — POTASSIUM CHLORIDE CRYS ER 20 MEQ PO TBCR
40.0000 meq | EXTENDED_RELEASE_TABLET | Freq: Once | ORAL | Status: AC
  Administered 2022-08-12: 40 meq via ORAL
  Filled 2022-08-12: qty 2

## 2022-08-12 MED ORDER — BLOOD PRESSURE KIT DEVI
1.0000 | Freq: Once | 0 refills | Status: AC
Start: 2022-08-12 — End: 2022-08-12

## 2022-08-12 MED ORDER — POTASSIUM CHLORIDE CRYS ER 20 MEQ PO TBCR: 20.0000 meq | EXTENDED_RELEASE_TABLET | Freq: Every day | ORAL | 0 refills | Status: DC

## 2022-08-12 MED ORDER — SODIUM CHLORIDE 0.9 % IV SOLN
2.0000 g | Freq: Once | INTRAVENOUS | Status: AC
  Administered 2022-08-12: 2 g via INTRAVENOUS
  Filled 2022-08-12: qty 20

## 2022-08-12 MED ORDER — SODIUM CHLORIDE 0.9 % IV SOLN
8.0000 mg | Freq: Once | INTRAVENOUS | Status: AC
  Administered 2022-08-12: 8 mg via INTRAVENOUS
  Filled 2022-08-12: qty 4

## 2022-08-12 NOTE — MAU Note (Signed)
X1 attempt to call [facility] for Lactic Acid draw.

## 2022-08-12 NOTE — MAU Note (Signed)
.  Melissa Little is a 37 y.o. at [redacted]w[redacted]d here in MAU reporting: Sent over for IV fluids and nausea medications. Patient has been experiencing N/V x4 days and had a low grade temperature in the office of 100.4 F. Patient reports "a little bit" of occasional lower abdominal pain that began this past Monday as well. Denies VB or LOF.  Onset of complaint: x4 days Pain score: 3/10 lower abdomen  FHT: 165 doppler Lab orders placed from triage:  UA, IV Fluids, IV meds, and US Renal.

## 2022-08-12 NOTE — MAU Note (Signed)
Called Main Lab to request blood cultures to be ran as they were collected at 1515. Lab stated they would run the samples now.

## 2022-08-12 NOTE — MAU Provider Note (Signed)
History     CSN: [id number]  Arrival date and time: 08/12/22 1147   Event Date/Time   First Provider Initiated Contact with Patient 08/12/22 1306      Chief Complaint  Patient presents with   Nausea   Emesis   Fever   Sent from Office for IV Fluids   HPI  Ms.Melissa Little is a 37 y.o. female [id number] @ [redacted]w[redacted]d here in MAU with new onset of N/V. She has no other symptoms, no back pain, no diarrhea, no urinary complaints.  Some occasional, very mild lower abdominal pain that is located all across her lower abdomen. The pain does not radiate. She has no pain at this time.  She has been vomiting x 4 days. She has not tried anything for the symptoms. She overall feels well other than weak and dehydrated.   She was seen in the office today for new OB and was sent here for further evaluation given the onset of fever in the office (100.4)  OB History     Gravida  6   Para  4   Term  4   Preterm  0   AB  1   Living  4      SAB  1   IAB  0   Ectopic  0   Multiple  0   Live Births  4           No past medical history on file.  No past surgical history on file.  Family History  Problem Relation Age of Onset   COPD Father    Lung cancer Paternal Grandfather     Social History   Tobacco Use   Smoking status: Never   Smokeless tobacco: Never  Vaping Use   Vaping status: Never Used  Substance Use Topics   Alcohol use: Not Currently    Comment: occasional   Drug use: Not Currently    Types: Marijuana    Allergies: No Known Allergies  Medications Prior to Admission  Medication Sig Dispense Refill Last Dose   Blood Pressure Monitoring (BLOOD PRESSURE KIT) DEVI 1 Device by Does not apply route once for 1 dose. 1 each 0    Doxylamine-Pyridoxine (DICLEGIS) 10-10 MG TBEC Take 2 tablets by mouth at bedtime. Initial, 2 tablets orally at bedtime on day 1 and 2; if symptoms persist, take 1 tablet in morning and 2 tablets at bedtime on day 3; if symptoms persist,  may increase to MAX 4 tablets per day, administered as 1 tablet in the morning, 1 tablet in mid-afternoon and 2 tablets at bedtime (Patient not taking: Reported on 08/05/2022) 60 tablet 0    Prenatal 28-0.8 MG TABS Take 1 tablet by mouth daily.       Review of Systems  Constitutional:  Positive for fatigue and fever.  Respiratory:  Negative for chest tightness.   Gastrointestinal:  Positive for nausea and vomiting. Negative for abdominal pain (None now), constipation and diarrhea.  Genitourinary:  Negative for dysuria, flank pain, frequency, urgency, vaginal bleeding and vaginal discharge.   Physical Exam   Blood pressure (!) 118/59, pulse (!) 118, temperature (!) 100.7 F (38.2 C), temperature source Oral, resp. rate 18, last menstrual period 05/26/2022, SpO2 98%.  Physical Exam Vitals and nursing note reviewed.  Constitutional:      Appearance: Normal appearance. She is ill-appearing. She is not toxic-appearing or diaphoretic.  Eyes:     Pupils: Pupils are equal, round, and reactive to light.  Pulmonary:  Effort: Pulmonary effort is normal.  Abdominal:     Palpations: Abdomen is soft.     Tenderness: There is no abdominal tenderness. There is no right CVA tenderness, left CVA tenderness, guarding or rebound.  Skin:    General: Skin is warm.  Neurological:     Mental Status: She is alert and oriented to person, place, and time.  Psychiatric:        Mood and Affect: Mood normal.    MAU Course  Procedures  MDM  + fetal heart tones via doppler  Lr bolus x 2, along with Zofran  CBC, CMP, lactic acid Covid and flu negative  UA which shows dehydration  Reviewed patient with Dr. Alysia Penna. No source for fever and leukocytosis, renal US normal. Over the patient feels significantly better following antiemetics, and fluids. She has no pain at this time, no vomiting while in MAU. She tolerated oral KDUR along with crackers and juice.  She was given 2 grams of IV rocephin and Blood  cultures/ urine culture are pending. She will need close follow up given elevated AST and fever without a source.   Assessment and Plan   A:  1. Leukocytosis, unspecified type   2. Hematuria, unspecified type   3. Anemia affecting pregnancy, antepartum   4. Fever of unknown origin   5. Nausea and vomiting in pregnancy   6. Elevated AST (SGOT)      Dc home Strict return precautions Hydrate at home RX: Kdur, Zofran, Duricef, iron  Follow up next week with the office Ok to take tylenol for fever  Will need iron infusions arrange   Duane Lope, NP 08/12/2022 7:24 PM

## 2022-08-12 NOTE — Progress Notes (Signed)
Subjective:   Melissa Little is a 37 y.o. [id number] at [redacted]w[redacted]d by midtrimester ultrasound being seen today for her first obstetrical visit.  Her obstetrical history is significant for advanced maternal age.  Pregnancy history fully reviewed.  Patient reports nausea, vomiting, and malaise .  HISTORY: OB History  Gravida Para Term Preterm AB Living  6 4 4  0 1 4  SAB IAB Ectopic Multiple Live Births  1 0 0 0 4    # Outcome Date GA Lbr Len/2nd Weight Sex Type Anes PTL Lv  6 Current           5 SAB 01/2021          4 Term 05/03/16 [redacted]w[redacted]d  7 lb 2 oz (3.232 kg) M Vag-Spont EPI  LIV  3 Term 02/07/14   7 lb 6 oz (3.345 kg) M Vag-Spont   LIV  2 Term 03/05/12 [redacted]w[redacted]d  6 lb 5 oz (2.863 kg) M Vag-Spont EPI  LIV  1 Term 12/06/02 [redacted]w[redacted]d  6 lb 3 oz (2.807 kg) F Vag-Spont EPI  LIV   History reviewed. No pertinent past medical history. History reviewed. No pertinent surgical history. Family History  Problem Relation Age of Onset   COPD Father    Lung cancer Paternal Grandfather    Social History   Tobacco Use   Smoking status: Never   Smokeless tobacco: Never  Vaping Use   Vaping status: Never Used  Substance Use Topics   Alcohol use: Not Currently    Comment: occasional   Drug use: Not Currently    Types: Marijuana   No Known Allergies No current facility-administered medications on file prior to visit.   Current Outpatient Medications on File Prior to Visit  Medication Sig Dispense Refill   Doxylamine-Pyridoxine (DICLEGIS) 10-10 MG TBEC Take 2 tablets by mouth at bedtime. Initial, 2 tablets orally at bedtime on day 1 and 2; if symptoms persist, take 1 tablet in morning and 2 tablets at bedtime on day 3; if symptoms persist, may increase to MAX 4 tablets per day, administered as 1 tablet in the morning, 1 tablet in mid-afternoon and 2 tablets at bedtime (Patient not taking: Reported on 08/05/2022) 60 tablet 0   Prenatal 28-0.8 MG TABS Take 1 tablet by mouth daily.       Exam   Vitals:    08/12/22 1018  BP: (!) 118/54  Pulse: (!) 122  Temp: (!) 100.4 F (38 C)  Weight: 127 lb 14.4 oz (58 kg)   Fetal Heart Rate (bpm): 170  System: General: well-developed, well-nourished female in no acute distress   Skin: normal coloration and turgor, no rashes   Neurologic: oriented, normal, negative, normal mood   Extremities: normal strength, tone, and muscle mass, ROM of all joints is normal   HEENT PERRLA, extraocular movement intact and sclera clear, anicteric   Mouth/Teeth mucous membranes moist, pharynx normal without lesions and dental hygiene good   Neck supple and no masses   Cardiovascular: regular rate and rhythm   Respiratory:  no respiratory distress, normal breath sounds   Abdomen: soft, non-tender; bowel sounds normal; no masses,  no organomegaly     Assessment:   Pregnancy: [id number] Patient Active Problem List   Diagnosis Date Noted   Supervision of high risk pregnancy, antepartum 08/05/2022   AMA (advanced maternal age) multigravida 35+ 08/05/2022   Anxiety 03/15/2022     Plan:  1. Supervision of high risk pregnancy, antepartum New OB labs - CBC/D/Plt+RPR+Rh+ABO+RubIgG... -  HgB A1c - PANORAMA PRENATAL TEST - HORIZON Basic Panel - Culture, OB Urine - Blood Pressure Monitoring (BLOOD PRESSURE KIT) DEVI; 1 Device by Does not apply route once for 1 dose.  Dispense: 1 each; Refill: 0 - [facility] AMB BABYSCRIPTS SCHEDULE OPTIMIZATION  2. Anxiety   3. Multigravida of advanced maternal age in second trimester NIPT  4. Malaise +/- Dehydration Low grade temp in office Appears dehydrated ? UTI Will send to MAU for further eval, IVFs, possible Rocephin, and monitoring. May need admission if concern for pyelo.  5. 19 weeks  Initial labs drawn. Continue prenatal vitamins. Genetic Screening discussed, NIPS: ordered. Ultrasound discussed; fetal anatomic survey: ordered. Problem list reviewed and updated.  Routine obstetric precautions reviewed. Return in 4  weeks (on 09/09/2022).

## 2022-08-12 NOTE — Progress Notes (Signed)
i

## 2022-08-12 NOTE — MAU Note (Signed)
Called Main Lab to check on status of Resp Panel. Lab placed RN on hold and then stated, "The sample will have to be ran again."

## 2022-08-15 LAB — CULTURE, BLOOD (ROUTINE X 2): Special Requests: ADEQUATE

## 2022-08-16 ENCOUNTER — Encounter: Payer: Self-pay | Admitting: *Deleted

## 2022-08-17 ENCOUNTER — Ambulatory Visit: Payer: Medicaid Other

## 2022-08-17 ENCOUNTER — Other Ambulatory Visit: Payer: Self-pay | Admitting: *Deleted

## 2022-08-17 ENCOUNTER — Encounter: Payer: Self-pay | Admitting: *Deleted

## 2022-08-17 ENCOUNTER — Ambulatory Visit: Payer: Medicaid Other | Admitting: *Deleted

## 2022-08-17 VITALS — BP 105/51 | HR 84

## 2022-08-17 DIAGNOSIS — O09522 Supervision of elderly multigravida, second trimester: Secondary | ICD-10-CM

## 2022-08-17 DIAGNOSIS — D72829 Elevated white blood cell count, unspecified: Secondary | ICD-10-CM | POA: Insufficient documentation

## 2022-08-17 DIAGNOSIS — Z3A20 20 weeks gestation of pregnancy: Secondary | ICD-10-CM

## 2022-08-17 DIAGNOSIS — O35BXX0 Maternal care for other (suspected) fetal abnormality and damage, fetal cardiac anomalies, not applicable or unspecified: Secondary | ICD-10-CM

## 2022-08-17 DIAGNOSIS — O0990 Supervision of high risk pregnancy, unspecified, unspecified trimester: Secondary | ICD-10-CM

## 2022-08-17 DIAGNOSIS — O99012 Anemia complicating pregnancy, second trimester: Secondary | ICD-10-CM | POA: Insufficient documentation

## 2022-08-17 DIAGNOSIS — O09529 Supervision of elderly multigravida, unspecified trimester: Secondary | ICD-10-CM | POA: Insufficient documentation

## 2022-08-17 NOTE — Progress Notes (Unsigned)
   PRENATAL VISIT NOTE  Subjective:  Melissa Little is a 37 y.o. [phone] at [redacted]w[redacted]d being seen today for ongoing prenatal care.  She is currently monitored for the following issues for this {Blank single:19197::"high-risk","low-risk"} pregnancy and has Supervision of high risk pregnancy, antepartum; AMA (advanced maternal age) multigravida 35+; Anxiety; Anemia affecting pregnancy in second trimester; and Leukocytosis on their problem list.  Patient reports {sx:14538}.   .  .   . Denies leaking of fluid.   The following portions of the patient's history were reviewed and updated as appropriate: allergies, current medications, past family history, past medical history, past social history, past surgical history and problem list.   Objective:  There were no vitals filed for this visit.  Fetal Status:           General:  Alert, oriented and cooperative. Patient is in no acute distress.  Skin: Skin is warm and dry. No rash noted.   Cardiovascular: Normal heart rate noted  Respiratory: Normal respiratory effort, no problems with respiration noted  Abdomen: Soft, gravid, appropriate for gestational age.        Pelvic: Cervical exam deferred        Extremities: Normal range of motion.     Mental Status: Normal mood and affect. Normal behavior. Normal judgment and thought content.   Assessment and Plan:  Pregnancy: [id number] at [redacted]w[redacted]d 1. Supervision of high risk pregnancy, antepartum Up to date Feeling better-- seen at MAU on 7/25 for NV   2. Multigravida of advanced maternal age in second trimester  3. Anemia affecting pregnancy in second trimester Lab Results  Component Value Date   HGB 9.0 (L) 08/12/2022   HGB 9.2 (L) 08/12/2022   HGB 10.7 (L) 06/30/2022   Patient will need Fe infusions set up, orders placed and rn to help schedule  4. Leukocytosis, unspecified type Repeat CBC Lab Results  Component Value Date   WBC 22.4 (H) 08/12/2022   WBC 21.1 (HH) 08/12/2022     5. Elevated  AST (SGOT) CMP today  {Blank single:19197::"Term","Preterm"} labor symptoms and general obstetric precautions including but not limited to vaginal bleeding, contractions, leaking of fluid and fetal movement were reviewed in detail with the patient. Please refer to After Visit Summary for other counseling recommendations.   No follow-ups on file.  Future Appointments  Date Time Provider Department Center  08/18/2022  9:35 AM Federico Flake, MD [facility]  09/09/2022  3:55 PM [place] Bing, MD [facility]  09/28/2022 12:30 PM [facility] US3 [facility]    Federico Flake, MD

## 2022-08-18 ENCOUNTER — Encounter: Payer: Self-pay | Admitting: Family Medicine

## 2022-08-18 ENCOUNTER — Ambulatory Visit (INDEPENDENT_AMBULATORY_CARE_PROVIDER_SITE_OTHER): Payer: Medicaid Other | Admitting: Family Medicine

## 2022-08-18 ENCOUNTER — Telehealth: Payer: Self-pay | Admitting: *Deleted

## 2022-08-18 ENCOUNTER — Other Ambulatory Visit: Payer: Self-pay

## 2022-08-18 VITALS — BP 98/63 | HR 80 | Wt 129.2 lb

## 2022-08-18 DIAGNOSIS — R7401 Elevation of levels of liver transaminase levels: Secondary | ICD-10-CM

## 2022-08-18 DIAGNOSIS — Z3A20 20 weeks gestation of pregnancy: Secondary | ICD-10-CM

## 2022-08-18 DIAGNOSIS — O09522 Supervision of elderly multigravida, second trimester: Secondary | ICD-10-CM

## 2022-08-18 DIAGNOSIS — O99012 Anemia complicating pregnancy, second trimester: Secondary | ICD-10-CM

## 2022-08-18 DIAGNOSIS — O0990 Supervision of high risk pregnancy, unspecified, unspecified trimester: Secondary | ICD-10-CM

## 2022-08-18 DIAGNOSIS — D72829 Elevated white blood cell count, unspecified: Secondary | ICD-10-CM

## 2022-08-18 DIAGNOSIS — O0992 Supervision of high risk pregnancy, unspecified, second trimester: Secondary | ICD-10-CM

## 2022-08-18 NOTE — Telephone Encounter (Signed)
Received orders from Dr. Alvester Morin for venofer 500 mg infusion . I called [facility] [phone] to schedule , and left message for them to call us back. Patient can have appointment Mon-Thurs anytime.  [name]

## 2022-08-20 NOTE — Telephone Encounter (Signed)
Short stay called back and informed me of appointment for 08/30/22 8am. She will be there about 5 hours. I called [place] and informed her of plan for Venofer Infusion and appointment and to check in Entrance A at admitting. I also informed her she will be there about 5 hours. She voices understanding. [name]

## 2022-08-30 ENCOUNTER — Encounter (HOSPITAL_COMMUNITY)
Admission: RE | Admit: 2022-08-30 | Discharge: 2022-08-30 | Disposition: A | Discharge status: Home / Self Care | Payer: Medicaid Other | Source: Ambulatory Visit | Attending: Family Medicine | Admitting: Family Medicine

## 2022-08-30 DIAGNOSIS — O99012 Anemia complicating pregnancy, second trimester: Secondary | ICD-10-CM | POA: Insufficient documentation

## 2022-08-30 MED ORDER — IRON SUCROSE 500 MG IVPB - SIMPLE MED
500.0000 mg | INTRAVENOUS | Status: DC
  Administered 2022-08-30: 500 mg via INTRAVENOUS
  Filled 2022-08-30: qty 275

## 2022-08-30 MED ORDER — IRON SUCROSE 500 MG IVPB - SIMPLE MED
500.0000 mg | INTRAVENOUS | Status: DC
  Filled 2022-08-30: qty 275

## 2022-09-07 ENCOUNTER — Encounter (HOSPITAL_COMMUNITY)
Admission: RE | Admit: 2022-09-07 | Discharge: 2022-09-07 | Disposition: A | Discharge status: Home / Self Care | Payer: Medicaid Other | Source: Ambulatory Visit | Attending: Family Medicine | Admitting: Family Medicine

## 2022-09-07 DIAGNOSIS — O99012 Anemia complicating pregnancy, second trimester: Secondary | ICD-10-CM | POA: Diagnosis not present

## 2022-09-07 MED ORDER — IRON SUCROSE 500 MG IVPB - SIMPLE MED
500.0000 mg | INTRAVENOUS | Status: DC
  Administered 2022-09-07: 500 mg via INTRAVENOUS
  Filled 2022-09-07: qty 500

## 2022-09-09 ENCOUNTER — Encounter: Payer: Medicaid Other | Admitting: Obstetrics and Gynecology

## 2022-09-14 ENCOUNTER — Encounter (HOSPITAL_COMMUNITY)
Admission: RE | Admit: 2022-09-14 | Discharge: 2022-09-14 | Disposition: A | Discharge status: Home / Self Care | Payer: Medicaid Other | Source: Ambulatory Visit | Attending: Family Medicine | Admitting: Family Medicine

## 2022-09-14 DIAGNOSIS — O99012 Anemia complicating pregnancy, second trimester: Secondary | ICD-10-CM | POA: Diagnosis not present

## 2022-09-14 MED ORDER — SODIUM CHLORIDE 0.9 % IV SOLN: INTRAVENOUS | Status: DC | PRN

## 2022-09-14 MED ORDER — ALBUTEROL SULFATE (2.5 MG/3ML) 0.083% IN NEBU: 2.5000 mg | INHALATION_SOLUTION | Freq: Once | RESPIRATORY_TRACT | Status: DC | PRN

## 2022-09-14 MED ORDER — EPINEPHRINE PF 1 MG/ML IJ SOLN: 0.3000 mg | Freq: Once | INTRAMUSCULAR | Status: DC | PRN

## 2022-09-14 MED ORDER — METHYLPREDNISOLONE SODIUM SUCC 125 MG IJ SOLR: 125.0000 mg | Freq: Once | INTRAMUSCULAR | Status: DC | PRN

## 2022-09-14 MED ORDER — IRON SUCROSE 500 MG IVPB - SIMPLE MED
500.0000 mg | INTRAVENOUS | Status: DC
  Administered 2022-09-14: 500 mg via INTRAVENOUS
  Filled 2022-09-14: qty 500

## 2022-09-14 MED ORDER — DIPHENHYDRAMINE HCL 50 MG/ML IJ SOLN: 25.0000 mg | Freq: Once | INTRAMUSCULAR | Status: DC | PRN

## 2022-09-14 MED ORDER — SODIUM CHLORIDE 0.9 % IV BOLUS: 500.0000 mL | Freq: Once | INTRAVENOUS | Status: DC | PRN

## 2022-09-15 ENCOUNTER — Encounter: Payer: Medicaid Other | Admitting: Obstetrics & Gynecology

## 2022-09-21 ENCOUNTER — Encounter (HOSPITAL_COMMUNITY): Payer: Self-pay | Admitting: Obstetrics and Gynecology

## 2022-09-21 ENCOUNTER — Inpatient Hospital Stay (HOSPITAL_COMMUNITY)
Admission: AD | Admit: 2022-09-21 | Discharge: 2022-09-22 | Disposition: A | Discharge status: Home / Self Care | Payer: Medicaid Other | Attending: Obstetrics and Gynecology | Admitting: Obstetrics and Gynecology

## 2022-09-21 DIAGNOSIS — Z1152 Encounter for screening for COVID-19: Secondary | ICD-10-CM | POA: Insufficient documentation

## 2022-09-21 DIAGNOSIS — F419 Anxiety disorder, unspecified: Secondary | ICD-10-CM | POA: Insufficient documentation

## 2022-09-21 DIAGNOSIS — B2790 Infectious mononucleosis, unspecified without complication: Secondary | ICD-10-CM | POA: Diagnosis not present

## 2022-09-21 DIAGNOSIS — R0602 Shortness of breath: Secondary | ICD-10-CM | POA: Diagnosis present

## 2022-09-21 DIAGNOSIS — O99012 Anemia complicating pregnancy, second trimester: Secondary | ICD-10-CM | POA: Diagnosis not present

## 2022-09-21 DIAGNOSIS — O26892 Other specified pregnancy related conditions, second trimester: Secondary | ICD-10-CM | POA: Diagnosis not present

## 2022-09-21 DIAGNOSIS — D72829 Elevated white blood cell count, unspecified: Secondary | ICD-10-CM | POA: Insufficient documentation

## 2022-09-21 DIAGNOSIS — O0992 Supervision of high risk pregnancy, unspecified, second trimester: Secondary | ICD-10-CM | POA: Insufficient documentation

## 2022-09-21 DIAGNOSIS — O98512 Other viral diseases complicating pregnancy, second trimester: Secondary | ICD-10-CM | POA: Diagnosis not present

## 2022-09-21 DIAGNOSIS — O99342 Other mental disorders complicating pregnancy, second trimester: Secondary | ICD-10-CM | POA: Diagnosis not present

## 2022-09-21 DIAGNOSIS — D649 Anemia, unspecified: Secondary | ICD-10-CM | POA: Diagnosis not present

## 2022-09-21 DIAGNOSIS — Z3A25 25 weeks gestation of pregnancy: Secondary | ICD-10-CM | POA: Insufficient documentation

## 2022-09-21 DIAGNOSIS — J029 Acute pharyngitis, unspecified: Secondary | ICD-10-CM

## 2022-09-21 DIAGNOSIS — O09522 Supervision of elderly multigravida, second trimester: Secondary | ICD-10-CM | POA: Insufficient documentation

## 2022-09-21 DIAGNOSIS — R519 Headache, unspecified: Secondary | ICD-10-CM | POA: Diagnosis present

## 2022-09-21 HISTORY — DX: Anemia, unspecified: D64.9

## 2022-09-21 LAB — CBC
HCT: 29.1 % — ABNORMAL LOW (ref 36.0–46.0)
Hemoglobin: 9.9 g/dL — ABNORMAL LOW (ref 12.0–15.0)
MCH: 26.8 pg (ref 26.0–34.0)
MCHC: 34 g/dL (ref 30.0–36.0)
MCV: 78.6 fL — ABNORMAL LOW (ref 80.0–100.0)
Platelets: 197 10*3/uL (ref 150–400)
RBC: 3.7 MIL/uL — ABNORMAL LOW (ref 3.87–5.11)
RDW: 20.2 % — ABNORMAL HIGH (ref 11.5–15.5)
WBC: 27.4 10*3/uL — ABNORMAL HIGH (ref 4.0–10.5)
nRBC: 0.1 % (ref 0.0–0.2)

## 2022-09-21 LAB — RESP PANEL BY RT-PCR (RSV, FLU A&B, COVID)  RVPGX2
Influenza A by PCR: NEGATIVE
Influenza B by PCR: NEGATIVE
Resp Syncytial Virus by PCR: NEGATIVE
SARS Coronavirus 2 by RT PCR: NEGATIVE

## 2022-09-21 LAB — GROUP A STREP BY PCR: Group A Strep by PCR: NOT DETECTED

## 2022-09-21 LAB — MONONUCLEOSIS SCREEN: Mono Screen: POSITIVE — AB

## 2022-09-21 MED ORDER — ACETAMINOPHEN 325 MG PO TABS
650.0000 mg | ORAL_TABLET | Freq: Once | ORAL | Status: AC
  Administered 2022-09-21: 650 mg via ORAL
  Filled 2022-09-21: qty 2

## 2022-09-21 NOTE — MAU Provider Note (Addendum)
History     CSN: [id number]  Arrival date and time: 09/21/22 1837   Event Date/Time   First Provider Initiated Contact with Patient 09/21/22 2012      Chief Complaint  Patient presents with   Sore Throat   Shortness of Breath   Sore Throat  Associated symptoms include shortness of breath. Pertinent negatives include no abdominal pain, congestion, coughing, diarrhea or vomiting.  Shortness of Breath Pertinent negatives include no abdominal pain, chest pain, fever, rash, sore throat or vomiting.   Patient is 37 y.o. [id number] [redacted]w[redacted]d here with complaints of SOB, sore throat and headache that started today. Son came home from school several days ago with similar symptoms that resolved within 24-48 hours. Has used Tylenol, last was this morning. She reports her sore throat felt like burning and has overall improved.   +FM, denies LOF, VB, contractions, vaginal discharge.   OB History     Gravida  6   Para  4   Term  4   Preterm  0   AB  1   Living  4      SAB  1   IAB  0   Ectopic  0   Multiple  0   Live Births  4           No past medical history on file.  No past surgical history on file.  Family History  Problem Relation Age of Onset   COPD Father    Lung cancer Paternal Grandfather     Social History   Tobacco Use   Smoking status: Never   Smokeless tobacco: Never  Vaping Use   Vaping status: Never Used  Substance Use Topics   Alcohol use: Not Currently    Comment: occasional   Drug use: Not Currently    Types: Marijuana    Allergies: No Known Allergies  Medications Prior to Admission  Medication Sig Dispense Refill Last Dose   cefadroxil (DURICEF) 500 MG capsule Take 1 capsule (500 mg total) by mouth 2 (two) times daily. 14 capsule 0    Ferrous Sulfate (IRON) 325 (65 Fe) MG TABS Take 1 tablet (325 mg total) by mouth every other day. 90 tablet 1    ondansetron (ZOFRAN) 8 MG tablet Take 1 tablet (8 mg total) by mouth every 8 (eight)  hours as needed for nausea or vomiting. 20 tablet 0    potassium chloride SA (KLOR-CON M) 20 MEQ tablet Take 1 tablet (20 mEq total) by mouth daily. 7 tablet 0    Prenatal 28-0.8 MG TABS Take 1 tablet by mouth daily.       Review of Systems  Constitutional:  Negative for chills and fever.  HENT:  Negative for congestion and sore throat.   Eyes:  Negative for pain and visual disturbance.  Respiratory:  Positive for shortness of breath. Negative for cough and chest tightness.   Cardiovascular:  Negative for chest pain.  Gastrointestinal:  Negative for abdominal pain, diarrhea, nausea and vomiting.  Endocrine: Negative for cold intolerance and heat intolerance.  Genitourinary:  Negative for dysuria and flank pain.  Musculoskeletal:  Negative for back pain.  Skin:  Negative for rash.  Allergic/Immunologic: Negative for food allergies.  Neurological:  Negative for dizziness and light-headedness.  Psychiatric/Behavioral:  Negative for agitation.    Physical Exam   Blood pressure 113/62, pulse (!) 123, temperature (!) 101.8 F (38.8 C), resp. rate 18, height 5\' 2"  (1.575 m), weight 59.4 kg, last menstrual period  03/23/2022, SpO2 99%.  Physical Exam Vitals and nursing note reviewed.  Constitutional:      General: She is not in acute distress.    Appearance: She is well-developed.     Comments: Pregnant female. Ill but non-toxic appearing.  Warm to touch  HENT:     Head: Normocephalic and atraumatic.     Nose: Nose normal.     Mouth/Throat:     Mouth: Mucous membranes are moist.     Pharynx: No oropharyngeal exudate.     Tonsils: No tonsillar exudate.  Eyes:     General: No scleral icterus.    Conjunctiva/sclera: Conjunctivae normal.  Cardiovascular:     Rate and Rhythm: Tachycardia present.     Heart sounds: Murmur (2/6 systolic murmur) heard.  Pulmonary:     Effort: Pulmonary effort is normal.     Breath sounds: No decreased breath sounds, wheezing, rhonchi or rales.  Chest:      Chest wall: No tenderness.  Abdominal:     Palpations: Abdomen is soft.     Tenderness: There is no abdominal tenderness. There is no guarding or rebound.     Comments: Gravid  Genitourinary:    Vagina: Normal.  Musculoskeletal:        General: Normal range of motion.     Cervical back: Normal range of motion and neck supple.  Lymphadenopathy:     Cervical: Cervical adenopathy present.  Skin:    General: Skin is warm and dry.     Findings: No rash.  Neurological:     Mental Status: She is alert and oriented to person, place, and time.    MAU Course  Procedures  I reviewed the patient's fetal monitoring.  Baseline HR: 170s Variability:  moderate Accels:present Decels: none  A/P: Reactive NST  Reassured regarding fetal status.    MDM: high  This patient presents to the ED for concern of   Chief Complaint  Patient presents with   Sore Throat   Shortness of Breath     These complains involves an extensive number of treatment options, and is a complaint that carries with it a high risk of complications and morbidity.  The differential diagnosis for  1.  Sore throat, shortness of breath, fever INCLUDES infectious causes are most likely concern for viral upper respiratory infection, strep throat.  Co morbidities that complicate the patient evaluation: Patient Active Problem List   Diagnosis Date Noted   Anemia affecting pregnancy in second trimester 08/17/2022   Leukocytosis 08/17/2022   Supervision of high risk pregnancy, antepartum 08/05/2022   AMA (advanced maternal age) multigravida 35+ 08/05/2022   Anxiety 03/15/2022     Interpreter services used: no  External records from outside source obtained and reviewed including Prenatal care records  Lab Tests: Other COVID, Flu, RSV, Strep  I ordered, and personally interpreted labs.  The pertinent results include:   Results for orders placed or performed during the hospital encounter of 09/21/22 (from the past  24 hour(s))  Resp panel by RT-PCR (RSV, Flu A&B, Covid) Anterior Nasal Swab     Status: None   Collection Time: 09/21/22  8:02 PM   Specimen: Anterior Nasal Swab  Result Value Ref Range   SARS Coronavirus 2 by RT PCR NEGATIVE NEGATIVE   Influenza A by PCR NEGATIVE NEGATIVE   Influenza B by PCR NEGATIVE NEGATIVE   Resp Syncytial Virus by PCR NEGATIVE NEGATIVE  Group A Strep by PCR     Status: None  Collection Time: 09/21/22  8:33 PM   Specimen: Throat; Sterile Swab  Result Value Ref Range   Group A Strep by PCR NOT DETECTED NOT DETECTED   Medicines ordered and prescription drug management:  Medications: Tylenol   Reevaluation of the patient after these medicines showed that the patient improved I have reviewed the patients home medicines and have made adjustments as needed   MAU Course:  9:35 PM.  Reviewed viral upper respiratory panel for COVID flu and RSV which was negative.  10:10 PM Strep NEG. Ordered CBC to r/o neutropenic fever.   10:39 PM Transferred care to CNM Wynelle Bourgeois   After the interventions noted above, I reevaluated the patient and found that they have :improved     Assessment and Plan     Future Appointments  Date Time Provider Department Center  09/28/2022 12:30 PM [facility] US[facility]    Federico Flake 09/21/2022, 8:12 PM   Assumed care I ran a test for Mononucleosis and it was positive Discussed with patient the diagnosis, anticipated length of illness and treatment involving supportive care DIscussed rare complication of splenomegaly and risk of rupture  Results for orders placed or performed during the hospital encounter of 09/21/22 (from the past 24 hour(s))  Resp panel by RT-PCR (RSV, Flu A&B, Covid) Anterior Nasal Swab     Status: None   Collection Time: 09/21/22  8:02 PM   Specimen: Anterior Nasal Swab  Result Value Ref Range   SARS Coronavirus 2 by RT PCR NEGATIVE NEGATIVE   Influenza A by PCR NEGATIVE NEGATIVE    Influenza B by PCR NEGATIVE NEGATIVE   Resp Syncytial Virus by PCR NEGATIVE NEGATIVE  Group A Strep by PCR     Status: None   Collection Time: 09/21/22  8:33 PM   Specimen: Throat; Sterile Swab  Result Value Ref Range   Group A Strep by PCR NOT DETECTED NOT DETECTED  CBC     Status: Abnormal   Collection Time: 09/21/22 10:47 PM  Result Value Ref Range   WBC 27.4 (H) 4.0 - 10.5 K/uL   RBC 3.70 (L) 3.87 - 5.11 MIL/uL   Hemoglobin 9.9 (L) 12.0 - 15.0 g/dL   HCT 40.9 (L) 81.1 - 91.4 %   MCV 78.6 (L) 80.0 - 100.0 fL   MCH 26.8 26.0 - 34.0 pg   MCHC 34.0 30.0 - 36.0 g/dL   RDW 78.2 (H) 95.6 - 21.3 %   Platelets 197 150 - 400 K/uL   nRBC 0.1 0.0 - 0.2 %  Mononucleosis screen     Status: Abnormal   Collection Time: 09/21/22 10:47 PM  Result Value Ref Range   Mono Screen POSITIVE (A) NEGATIVE   Discharge home Supportive care Rx Mucinex for cough Rx [place] Throat spray May use Chloroseptic spray Tylenol for fever and pain Encouraged to return if she develops worsening of symptoms, increase in pain, fever, or other concerning symptoms.   Aviva Signs, CNM

## 2022-09-21 NOTE — MAU Note (Signed)
.  Melissa Little is a 37 y.o. at [redacted]w[redacted]d here in MAU reporting sore throat and h/a since yesterday. Fever today. Reports good FM. No pregnancy concerns. States her son came home from school several days ago with similar symptoms.  Onset of complaint: Monday Pain score: 8 Vitals:   09/21/22 1954 09/21/22 1959  BP:  113/62  Pulse: (!) 123   Resp: 18   Temp: (!) 101.8 F (38.8 C)   SpO2: 99%      FHT:174 Lab orders placed from triage:  u/a

## 2022-09-22 DIAGNOSIS — B2790 Infectious mononucleosis, unspecified without complication: Secondary | ICD-10-CM

## 2022-09-22 DIAGNOSIS — Z3A25 25 weeks gestation of pregnancy: Secondary | ICD-10-CM

## 2022-09-22 DIAGNOSIS — J029 Acute pharyngitis, unspecified: Secondary | ICD-10-CM

## 2022-09-22 MED ORDER — AYR THROAT SPRAY MT LIQD: 1.0000 | Freq: Three times a day (TID) | OROMUCOSAL | 0 refills | Status: DC | PRN

## 2022-09-22 MED ORDER — GUAIFENESIN ER 600 MG PO TB12: 600.0000 mg | ORAL_TABLET | Freq: Two times a day (BID) | ORAL | 0 refills | Status: DC | PRN

## 2022-09-28 ENCOUNTER — Inpatient Hospital Stay (HOSPITAL_COMMUNITY): Payer: Medicaid Other

## 2022-09-28 ENCOUNTER — Encounter (HOSPITAL_COMMUNITY): Payer: Self-pay | Admitting: Obstetrics & Gynecology

## 2022-09-28 ENCOUNTER — Inpatient Hospital Stay (HOSPITAL_COMMUNITY)
Admission: AD | Admit: 2022-09-28 | Discharge: 2022-10-04 | DRG: 818 | Payer: Medicaid Other | Attending: Obstetrics and Gynecology | Admitting: Obstetrics and Gynecology

## 2022-09-28 ENCOUNTER — Other Ambulatory Visit: Payer: Self-pay | Admitting: *Deleted

## 2022-09-28 ENCOUNTER — Ambulatory Visit (HOSPITAL_BASED_OUTPATIENT_CLINIC_OR_DEPARTMENT_OTHER): Payer: Medicaid Other

## 2022-09-28 ENCOUNTER — Other Ambulatory Visit: Payer: Self-pay

## 2022-09-28 DIAGNOSIS — E871 Hypo-osmolality and hyponatremia: Secondary | ICD-10-CM | POA: Diagnosis present

## 2022-09-28 DIAGNOSIS — N12 Tubulo-interstitial nephritis, not specified as acute or chronic: Secondary | ICD-10-CM | POA: Insufficient documentation

## 2022-09-28 DIAGNOSIS — R21 Rash and other nonspecific skin eruption: Secondary | ICD-10-CM | POA: Diagnosis present

## 2022-09-28 DIAGNOSIS — O2222 Superficial thrombophlebitis in pregnancy, second trimester: Secondary | ICD-10-CM | POA: Diagnosis present

## 2022-09-28 DIAGNOSIS — Z1152 Encounter for screening for COVID-19: Secondary | ICD-10-CM | POA: Diagnosis not present

## 2022-09-28 DIAGNOSIS — Z801 Family history of malignant neoplasm of trachea, bronchus and lung: Secondary | ICD-10-CM | POA: Diagnosis not present

## 2022-09-28 DIAGNOSIS — O99282 Endocrine, nutritional and metabolic diseases complicating pregnancy, second trimester: Secondary | ICD-10-CM | POA: Diagnosis present

## 2022-09-28 DIAGNOSIS — R590 Localized enlarged lymph nodes: Secondary | ICD-10-CM | POA: Diagnosis present

## 2022-09-28 DIAGNOSIS — O09522 Supervision of elderly multigravida, second trimester: Secondary | ICD-10-CM

## 2022-09-28 DIAGNOSIS — O0990 Supervision of high risk pregnancy, unspecified, unspecified trimester: Principal | ICD-10-CM

## 2022-09-28 DIAGNOSIS — E860 Dehydration: Secondary | ICD-10-CM | POA: Diagnosis present

## 2022-09-28 DIAGNOSIS — R509 Fever, unspecified: Secondary | ICD-10-CM | POA: Diagnosis present

## 2022-09-28 DIAGNOSIS — B2790 Infectious mononucleosis, unspecified without complication: Secondary | ICD-10-CM | POA: Diagnosis not present

## 2022-09-28 DIAGNOSIS — O2302 Infections of kidney in pregnancy, second trimester: Secondary | ICD-10-CM | POA: Diagnosis present

## 2022-09-28 DIAGNOSIS — R680 Hypothermia, not associated with low environmental temperature: Secondary | ICD-10-CM | POA: Diagnosis not present

## 2022-09-28 DIAGNOSIS — Z825 Family history of asthma and other chronic lower respiratory diseases: Secondary | ICD-10-CM

## 2022-09-28 DIAGNOSIS — O35BXX0 Maternal care for other (suspected) fetal abnormality and damage, fetal cardiac anomalies, not applicable or unspecified: Secondary | ICD-10-CM | POA: Diagnosis not present

## 2022-09-28 DIAGNOSIS — O212 Late vomiting of pregnancy: Secondary | ICD-10-CM | POA: Diagnosis present

## 2022-09-28 DIAGNOSIS — Z3A26 26 weeks gestation of pregnancy: Secondary | ICD-10-CM

## 2022-09-28 DIAGNOSIS — O09523 Supervision of elderly multigravida, third trimester: Secondary | ICD-10-CM

## 2022-09-28 DIAGNOSIS — Z3A27 27 weeks gestation of pregnancy: Secondary | ICD-10-CM

## 2022-09-28 DIAGNOSIS — E876 Hypokalemia: Secondary | ICD-10-CM | POA: Diagnosis present

## 2022-09-28 DIAGNOSIS — D72829 Elevated white blood cell count, unspecified: Secondary | ICD-10-CM | POA: Diagnosis not present

## 2022-09-28 DIAGNOSIS — A419 Sepsis, unspecified organism: Secondary | ICD-10-CM

## 2022-09-28 DIAGNOSIS — O99012 Anemia complicating pregnancy, second trimester: Secondary | ICD-10-CM | POA: Diagnosis present

## 2022-09-28 DIAGNOSIS — D72825 Bandemia: Secondary | ICD-10-CM | POA: Diagnosis not present

## 2022-09-28 DIAGNOSIS — O99891 Other specified diseases and conditions complicating pregnancy: Secondary | ICD-10-CM | POA: Diagnosis not present

## 2022-09-28 LAB — CBC WITH DIFFERENTIAL/PLATELET
Abs Immature Granulocytes: 0 10*3/uL (ref 0.00–0.07)
Basophils Absolute: 0 10*3/uL (ref 0.0–0.1)
Basophils Relative: 0 %
Eosinophils Absolute: 0.2 10*3/uL (ref 0.0–0.5)
Eosinophils Relative: 1 %
HCT: 28.8 % — ABNORMAL LOW (ref 36.0–46.0)
Hemoglobin: 9.6 g/dL — ABNORMAL LOW (ref 12.0–15.0)
Lymphocytes Relative: 2 %
Lymphs Abs: 0.5 10*3/uL — ABNORMAL LOW (ref 0.7–4.0)
MCH: 25.9 pg — ABNORMAL LOW (ref 26.0–34.0)
MCHC: 33.3 g/dL (ref 30.0–36.0)
MCV: 77.6 fL — ABNORMAL LOW (ref 80.0–100.0)
Monocytes Absolute: 1.6 10*3/uL — ABNORMAL HIGH (ref 0.1–1.0)
Monocytes Relative: 7 %
Neutro Abs: 20.3 10*3/uL — ABNORMAL HIGH (ref 1.7–7.7)
Neutrophils Relative %: 90 %
Platelets: 215 10*3/uL (ref 150–400)
RBC: 3.71 MIL/uL — ABNORMAL LOW (ref 3.87–5.11)
RDW: 19.8 % — ABNORMAL HIGH (ref 11.5–15.5)
WBC: 22.5 10*3/uL — ABNORMAL HIGH (ref 4.0–10.5)
nRBC: 0 /100{WBCs}
nRBC: 0.2 % (ref 0.0–0.2)

## 2022-09-28 LAB — URINALYSIS, ROUTINE W REFLEX MICROSCOPIC
Glucose, UA: NEGATIVE mg/dL
Ketones, ur: 15 mg/dL — AB
Nitrite: NEGATIVE
Protein, ur: 30 mg/dL — AB
Specific Gravity, Urine: <1.005 — ABNORMAL LOW (ref 1.005–1.030)
pH: 6.5 (ref 5.0–8.0)

## 2022-09-28 LAB — SEDIMENTATION RATE: Sed Rate: 47 mm/h — ABNORMAL HIGH (ref 0–22)

## 2022-09-28 LAB — URINALYSIS, MICROSCOPIC (REFLEX)

## 2022-09-28 LAB — RESP PANEL BY RT-PCR (RSV, FLU A&B, COVID)  RVPGX2
Influenza A by PCR: NEGATIVE
Influenza B by PCR: NEGATIVE
Resp Syncytial Virus by PCR: NEGATIVE
SARS Coronavirus 2 by RT PCR: NEGATIVE

## 2022-09-28 LAB — COMPREHENSIVE METABOLIC PANEL
ALT: 11 U/L (ref 0–44)
AST: 40 U/L (ref 15–41)
Albumin: 2.1 g/dL — ABNORMAL LOW (ref 3.5–5.0)
Alkaline Phosphatase: 144 U/L — ABNORMAL HIGH (ref 38–126)
Anion gap: 11 (ref 5–15)
BUN: 5 mg/dL — ABNORMAL LOW (ref 6–20)
CO2: 23 mmol/L (ref 22–32)
Calcium: 7.8 mg/dL — ABNORMAL LOW (ref 8.9–10.3)
Chloride: 91 mmol/L — ABNORMAL LOW (ref 98–111)
Creatinine, Ser: 0.53 mg/dL (ref 0.44–1.00)
GFR, Estimated: >60 mL/min (ref >60)
Glucose, Bld: 79 mg/dL (ref 70–99)
Potassium: 2.3 mmol/L — CL (ref 3.5–5.1)
Sodium: 125 mmol/L — ABNORMAL LOW (ref 135–145)
Total Bilirubin: 0.6 mg/dL (ref 0.3–1.2)
Total Protein: 6.1 g/dL — ABNORMAL LOW (ref 6.5–8.1)

## 2022-09-28 LAB — TSH: TSH: 2.138 u[IU]/mL (ref 0.350–4.500)

## 2022-09-28 LAB — LACTATE DEHYDROGENASE: LDH: 617 U/L — ABNORMAL HIGH (ref 98–192)

## 2022-09-28 LAB — APTT: aPTT: 36 s (ref 24–36)

## 2022-09-28 LAB — C-REACTIVE PROTEIN: CRP: 21.8 mg/dL — ABNORMAL HIGH (ref <1.0)

## 2022-09-28 LAB — PROTIME-INR
INR: 1.4 — ABNORMAL HIGH (ref 0.8–1.2)
Prothrombin Time: 17.5 s — ABNORMAL HIGH (ref 11.4–15.2)

## 2022-09-28 LAB — MAGNESIUM: Magnesium: 1.5 mg/dL — ABNORMAL LOW (ref 1.7–2.4)

## 2022-09-28 MED ORDER — CALCIUM CARBONATE ANTACID 500 MG PO CHEW
2.0000 | CHEWABLE_TABLET | ORAL | Status: DC | PRN
  Administered 2022-09-29: 400 mg via ORAL
  Filled 2022-09-28: qty 2

## 2022-09-28 MED ORDER — OXYCODONE-ACETAMINOPHEN 5-325 MG PO TABS
2.0000 | ORAL_TABLET | Freq: Once | ORAL | Status: AC
  Administered 2022-09-28: 2 via ORAL
  Filled 2022-09-28: qty 2

## 2022-09-28 MED ORDER — LACTATED RINGERS IV BOLUS
1000.0000 mL | Freq: Once | INTRAVENOUS | Status: AC
  Administered 2022-09-28: 1000 mL via INTRAVENOUS

## 2022-09-28 MED ORDER — ACETAMINOPHEN 325 MG PO TABS
650.0000 mg | ORAL_TABLET | ORAL | Status: DC | PRN
  Administered 2022-09-29 – 2022-09-30 (×6): 650 mg via ORAL
  Filled 2022-09-28 (×6): qty 2

## 2022-09-28 MED ORDER — FERROUS SULFATE 325 (65 FE) MG PO TABS
325.0000 mg | ORAL_TABLET | ORAL | Status: DC
  Administered 2022-09-29 – 2022-10-03 (×2): 325 mg via ORAL
  Filled 2022-09-28 (×3): qty 1

## 2022-09-28 MED ORDER — ZOLPIDEM TARTRATE 5 MG PO TABS: 5.0000 mg | ORAL_TABLET | Freq: Every evening | ORAL | Status: DC | PRN

## 2022-09-28 MED ORDER — POTASSIUM CHLORIDE CRYS ER 20 MEQ PO TBCR
40.0000 meq | EXTENDED_RELEASE_TABLET | Freq: Two times a day (BID) | ORAL | Status: AC
  Administered 2022-09-28 – 2022-09-30 (×4): 40 meq via ORAL
  Filled 2022-09-28 (×6): qty 2

## 2022-09-28 MED ORDER — PRENATAL MULTIVITAMIN CH
1.0000 | ORAL_TABLET | Freq: Every day | ORAL | Status: DC
  Administered 2022-09-29 – 2022-10-04 (×4): 1 via ORAL
  Filled 2022-09-28 (×5): qty 1

## 2022-09-28 MED ORDER — DOCUSATE SODIUM 100 MG PO CAPS
100.0000 mg | ORAL_CAPSULE | Freq: Every day | ORAL | Status: DC
  Administered 2022-09-29: 100 mg via ORAL
  Filled 2022-09-28 (×4): qty 1

## 2022-09-28 MED ORDER — SODIUM CHLORIDE 0.9 % IV SOLN: INTRAVENOUS | Status: DC

## 2022-09-28 MED ORDER — KETOROLAC TROMETHAMINE 30 MG/ML IJ SOLN
30.0000 mg | Freq: Once | INTRAMUSCULAR | Status: AC
  Administered 2022-09-28: 30 mg via INTRAVENOUS
  Filled 2022-09-28: qty 1

## 2022-09-28 MED ORDER — POTASSIUM CHLORIDE 10 MEQ/100ML IV SOLN
10.0000 meq | INTRAVENOUS | Status: AC
  Administered 2022-09-28 – 2022-09-29 (×4): 10 meq via INTRAVENOUS
  Filled 2022-09-28 (×6): qty 100

## 2022-09-28 MED ORDER — SODIUM CHLORIDE 0.9 % IV SOLN
2.0000 g | INTRAVENOUS | Status: DC
  Administered 2022-09-28: 2 g via INTRAVENOUS
  Filled 2022-09-28: qty 20

## 2022-09-28 NOTE — MAU Note (Signed)
CRITICAL VALUE STICKER  CRITICAL VALUE: K 2.3  RECEIVER (on-site recipient of call): Melissa Little  DATE & TIME NOTIFIED: 09/28/2022 2133  MESSENGER: lab tech  MD NOTIFIED: Gildardo Griffes, MD   TIME OF NOTIFICATION: 2134  RESPONSE: acknowledged

## 2022-09-28 NOTE — MAU Note (Signed)
.  Melissa Little is a 37 y.o. at [redacted]w[redacted]d here in MAU reporting: has had n/v today. Can't keep anything down.Feels dehydrated. C/o headache. Good fetal movement reported.  LMP:  Onset of complaint: this morning Pain score: 8 There were no vitals filed for this visit.   FHT:172 Lab orders placed from triage:

## 2022-09-28 NOTE — H&P (Addendum)
Melissa Little is an 37 y.o. [phone] [redacted]w[redacted]d female.   Chief Complaint: dehydration HPI: h/o fever x 3 months, which comes and goes, with previously negative urine and blood cultures in 07/2022. H/o leukocytosis with metamyelocytes on smear, lymphadenopathy, and purpural rash. Has some extreme fatigue and joint pain + swelling. + monospot last week with ST, though no sore throat now. Feels like she has a UTI now. Today has had N/V and feels dehydrated. Has negative respiratory panel x 3. Had one elevated AST on 08/12/2026 at 158, with normal ALT, then resolved.  PNC: [facility]  OB History     Gravida  6   Para  4   Term  4   Preterm  0   AB  1   Living  4      SAB  1   IAB  0   Ectopic  0   Multiple  0   Live Births  4           Past Medical History:  Diagnosis Date   Anemia     Past Surgical History:  Procedure Laterality Date   NO PAST SURGERIES      Family History  Problem Relation Age of Onset   COPD Father    Lung cancer Paternal Grandfather    Social History:  reports that she has never smoked. She has never used smokeless tobacco. She reports that she does not currently use alcohol. She reports that she does not currently use drugs after having used the following drugs: Marijuana.   No Known Allergies  Medications Prior to Admission  Medication Sig Dispense Refill   Ferrous Sulfate (IRON) 325 (65 Fe) MG TABS Take 1 tablet (325 mg total) by mouth every other day. 90 tablet 1   guaiFENesin (MUCINEX) 600 MG 12 hr tablet Take 1 tablet (600 mg total) by mouth 2 (two) times daily as needed for cough. 30 tablet 0   ondansetron (ZOFRAN) 8 MG tablet Take 1 tablet (8 mg total) by mouth every 8 (eight) hours as needed for nausea or vomiting. 20 tablet 0   Prenatal 28-0.8 MG TABS Take 1 tablet by mouth daily.     Misc. Throat Products (AYR THROAT SPRAY) LIQD Use as directed 1 spray in the mouth or throat every 8 (eight) hours as needed (sore throat). 59 mL 0    potassium chloride SA (KLOR-CON M) 20 MEQ tablet Take 1 tablet (20 mEq total) by mouth daily. (Patient not taking: Reported on 09/21/2022) 7 tablet 0     A comprehensive review of systems was negative.  Objective: Blood pressure (!) 117/53, pulse (!) 128, temperature (!) 101.2 F (38.4 C), resp. rate 16, height 5\' 2"  (1.575 m), weight 58.1 kg, last menstrual period 03/23/2022. BP (!) 117/53   Pulse (!) 128   Temp (!) 101.2 F (38.4 C)   Resp 16   Ht 5\' 2"  (1.575 m)   Wt 58.1 kg   LMP 03/23/2022   BMI 23.41 kg/m  General appearance: alert, cooperative, and appears stated age Head: Normocephalic, without obvious abnormality, atraumatic Neck: supple, symmetrical, trachea midline and thyroid not enlarged, symmetric, no tenderness/mass/nodules Lungs:  normal effort Heart:  tachy, +1/6 murmur Abdomen: soft, non-tender; bowel sounds normal; no masses,  no organomegaly Back: no CVA tenderness Extremities:  no edema  + edema in 3rd finger, see photo    Skin:  purpural rash on LE bilaterally  see photo         Lymph  nodes: Cervical adenopathy: mobile, soft Neurologic: Grossly normal  NST:  Baseline: 170 bpm, Variability: Good {> 6 bpm), Accelerations: Non-reactive but appropriate for gestational age, and Decelerations: Absent Toco: quiet  Labs: Results for orders placed or performed during the hospital encounter of 09/28/22 (from the past 24 hour(s))  Urinalysis, Routine w reflex microscopic -Urine, Clean Catch     Status: Abnormal   Collection Time: 09/28/22  5:37 PM  Result Value Ref Range   Color, Urine YELLOW YELLOW   APPearance CLOUDY (A) CLEAR   Specific Gravity, Urine <1.005 (L) 1.005 - 1.030   pH 6.5 5.0 - 8.0   Glucose, UA NEGATIVE NEGATIVE mg/dL   Hgb urine dipstick LARGE (A) NEGATIVE   Bilirubin Urine SMALL (A) NEGATIVE   Ketones, ur 15 (A) NEGATIVE mg/dL   Protein, ur 30 (A) NEGATIVE mg/dL   Nitrite NEGATIVE NEGATIVE   Leukocytes,Ua LARGE (A) NEGATIVE  Resp  panel by RT-PCR (RSV, Flu A&B, Covid) Anterior Nasal Swab     Status: None   Collection Time: 09/28/22  5:37 PM   Specimen: Anterior Nasal Swab  Result Value Ref Range   SARS Coronavirus 2 by RT PCR NEGATIVE NEGATIVE   Influenza A by PCR NEGATIVE NEGATIVE   Influenza B by PCR NEGATIVE NEGATIVE   Resp Syncytial Virus by PCR NEGATIVE NEGATIVE  Urinalysis, Microscopic (reflex)     Status: Abnormal   Collection Time: 09/28/22  5:37 PM  Result Value Ref Range   RBC / HPF 6-10 0 - 5 RBC/hpf   WBC, UA 21-50 0 - 5 WBC/hpf   Bacteria, UA RARE (A) NONE SEEN   Squamous Epithelial / HPF 11-20 0 - 5 /HPF   Non Squamous Epithelial PRESENT (A) NONE SEEN   Mucus PRESENT   CBC with Differential/Platelet     Status: Abnormal   Collection Time: 09/28/22  7:29 PM  Result Value Ref Range   WBC 22.5 (H) 4.0 - 10.5 K/uL   RBC 3.71 (L) 3.87 - 5.11 MIL/uL   Hemoglobin 9.6 (L) 12.0 - 15.0 g/dL   HCT 16.1 (L) 09.6 - 04.5 %   MCV 77.6 (L) 80.0 - 100.0 fL   MCH 25.9 (L) 26.0 - 34.0 pg   MCHC 33.3 30.0 - 36.0 g/dL   RDW 40.9 (H) 81.1 - 91.4 %   Platelets 215 150 - 400 K/uL   nRBC 0.2 0.0 - 0.2 %   Neutrophils Relative % 90 %   Neutro Abs 20.3 (H) 1.7 - 7.7 K/uL   Lymphocytes Relative 2 %   Lymphs Abs 0.5 (L) 0.7 - 4.0 K/uL   Monocytes Relative 7 %   Monocytes Absolute 1.6 (H) 0.1 - 1.0 K/uL   Eosinophils Relative 1 %   Eosinophils Absolute 0.2 0.0 - 0.5 K/uL   Basophils Relative 0 %   Basophils Absolute 0.0 0.0 - 0.1 K/uL   nRBC 0 0 /100 WBC   Abs Immature Granulocytes 0.00 0.00 - 0.07 K/uL  C-reactive protein     Status: Abnormal   Collection Time: 09/28/22  7:29 PM  Result Value Ref Range   CRP 21.8 (H) <1.0 mg/dL  Comprehensive metabolic panel     Status: Abnormal   Collection Time: 09/28/22  7:29 PM  Result Value Ref Range   Sodium 125 (L) 135 - 145 mmol/L   Potassium 2.3 (LL) 3.5 - 5.1 mmol/L   Chloride 91 (L) 98 - 111 mmol/L   CO2 23 22 - 32 mmol/L   Glucose,  Bld 79 70 - 99 mg/dL   BUN  5 (L) 6 - 20 mg/dL   Creatinine, Ser 4.13 0.44 - 1.00 mg/dL   Calcium 7.8 (L) 8.9 - 10.3 mg/dL   Total Protein 6.1 (L) 6.5 - 8.1 g/dL   Albumin 2.1 (L) 3.5 - 5.0 g/dL   AST 40 15 - 41 U/L   ALT 11 0 - 44 U/L   Alkaline Phosphatase 144 (H) 38 - 126 U/L   Total Bilirubin 0.6 0.3 - 1.2 mg/dL   GFR, Estimated >24 >40 mL/min   Anion gap 11 5 - 15  Lactate dehydrogenase     Status: Abnormal   Collection Time: 09/28/22  7:29 PM  Result Value Ref Range   LDH 617 (H) 98 - 192 U/L  Protime-INR     Status: Abnormal   Collection Time: 09/28/22  7:29 PM  Result Value Ref Range   Prothrombin Time 17.5 (H) 11.4 - 15.2 seconds   INR 1.4 (H) 0.8 - 1.2  APTT     Status: None   Collection Time: 09/28/22  7:29 PM  Result Value Ref Range   aPTT 36 24 - 36 seconds            ABO, Rh: B/Positive/-- (07/25 1141)  Antibody: Negative (07/25 1141)  Rubella: 3.31 (07/25 1141)  RPR: Non Reactive (07/25 1141)  HBsAg: Negative (07/25 1141)  HIV: Non Reactive (07/25 1141)  GBS:      Radiology studies: Head and neck u/s 07/06/2022   [place] MFM OB FOLLOW UP  Result Date: 09/28/2022 ----------------------------------------------------------------------  OBSTETRICS REPORT                       (Signed Final 09/28/2022 01:18 pm) ---------------------------------------------------------------------- Patient Info  ID #:       [id number]                          D.O.B.:  [birth year] (36 yrs)  Name:       Melissa Little                    Visit Date: 09/28/2022 12:38 pm ---------------------------------------------------------------------- Performed By  Attending:        Lin Landsman      Ref. Address:     [address]                    [place]                                                             [place], [place]                                                             [address]  Performed By:     Alain Marion     [facility]  Referred By:      Reva Bores                    MD ---------------------------------------------------------------------- Orders  #  Description                           Code        Ordered By  1  [place] MFM OB FOLLOW UP                   78295.62    Braxton Feathers ----------------------------------------------------------------------  #  Order #                     Accession #                Episode #  1  [id number]                   [id number]                 [id number] ---------------------------------------------------------------------- Indications  Echogenic intracardiac focus of the heart      O35.63XX0  (EIF)  Advanced maternal age multigravida 50+,        O47.522  second trimester (36 yrs)  Antenatal follow-up for nonvisualized fetal    Z36.2  anatomy  [redacted] weeks gestation of pregnancy                Z3A.26  LR NIPS, Neg horizon ---------------------------------------------------------------------- Fetal Evaluation  Num Of Fetuses:         1  Fetal Heart Rate(bpm):  148  Cardiac Activity:       Observed  Presentation:           Cephalic  Placenta:               Posterior Fundal  AFI Sum(cm)     %Tile       Largest Pocket(cm)  8.88            3.5         3.52  RUQ(cm)       RLQ(cm)       LUQ(cm)        LLQ(cm)  1.71          3.52          2.05           1.6 ---------------------------------------------------------------------- Biometry  BPD:      67.2  mm     G. Age:  27w 0d         57  %    CI:        77.92   %  70 - 86                                                          FL/HC:      20.0   %    18.6 - 20.4  HC:      240.9  mm     G. Age:  26w 1d         14  %    HC/AC:      1.10        1.05 - 1.21  AC:      218.4  mm     G. Age:  26w 2d         33  %    FL/BPD:     71.9   %    71 - 87  FL:       48.3  mm     G. Age:  26w 1d         25  %    FL/AC:       22.1   %    20 - 24  Est. FW:     917  gm           2 lb     27  % ---------------------------------------------------------------------- OB History  Gravidity:    6         Term:   4        Prem:   0        SAB:   1  TOP:          0       Ectopic:  0        Living: 4 ---------------------------------------------------------------------- Gestational Age  LMP:           27w 0d        Date:  03/23/22                   EDD:   12/28/22  U/S Today:     26w 3d                                        EDD:   01/01/23  Best:          26w 4d     Det. ByMarcella Dubs         EDD:   12/31/22                                      (06/30/22) ---------------------------------------------------------------------- Anatomy  Cranium:               Appears normal         Kidneys:                Appear normal  Diaphragm:             Appears normal         Bladder:                Appears normal  Stomach:  Appears normal, left   Spine:                  Ltd views no                         sided                                          intracranial signs of                                                                        NTD  Abdomen:               Appears normal  Other:  Open hands visualized. Other anatomy previously seen. ---------------------------------------------------------------------- Impression  Follow up growth due to complete the fetal anatomy and AMA  Normal interval growth with measurements consistent with  dates  Good fetal movement and amniotic fluid volume  Anatomy completed. ---------------------------------------------------------------------- Recommendations  Follow up growth at 32 weeks was scheduled. ----------------------------------------------------------------------              Lin Landsman, MD Electronically Signed Final Report   09/28/2022 01:18 pm ----------------------------------------------------------------------    Assessment/Plan Principal Problem:   Pyelonephritis Active  Problems:   [redacted] weeks gestation of pregnancy   Pyelonephritis affecting pregnancy in second trimester Though we are treating presumptive pyelo, she has a rash and other long standing findings and joint pain that make a connective tissue or vasculitis quite possible. LDH is very high, check peripheral smear.  Profound hypokalemia Hypokalemia Mildly abnormal INR ? Sepsis vs. Other.  Admit Labs pending, including ESR, CRP, w/u for SLE, RA, vasculitits, rickettsial disease, malaria, peripheral smear. Check urine  and blood cultures Rocephin Consider MFM consult in am Check CXR. Likely will need Rheum referral outpt.    Reva Bores 09/28/2022, 9:36 PM

## 2022-09-29 ENCOUNTER — Inpatient Hospital Stay (HOSPITAL_COMMUNITY): Payer: Medicaid Other

## 2022-09-29 DIAGNOSIS — R509 Fever, unspecified: Secondary | ICD-10-CM

## 2022-09-29 DIAGNOSIS — N12 Tubulo-interstitial nephritis, not specified as acute or chronic: Secondary | ICD-10-CM | POA: Diagnosis not present

## 2022-09-29 DIAGNOSIS — A419 Sepsis, unspecified organism: Secondary | ICD-10-CM | POA: Diagnosis not present

## 2022-09-29 LAB — CORTISOL-AM, BLOOD: Cortisol - AM: 30.6 ug/dL — ABNORMAL HIGH (ref 6.7–22.6)

## 2022-09-29 LAB — CBC
HCT: 28.4 % — ABNORMAL LOW (ref 36.0–46.0)
Hemoglobin: 9.4 g/dL — ABNORMAL LOW (ref 12.0–15.0)
MCH: 25.8 pg — ABNORMAL LOW (ref 26.0–34.0)
MCHC: 33.1 g/dL (ref 30.0–36.0)
MCV: 78 fL — ABNORMAL LOW (ref 80.0–100.0)
Platelets: 195 10*3/uL (ref 150–400)
RBC: 3.64 MIL/uL — ABNORMAL LOW (ref 3.87–5.11)
RDW: 19.6 % — ABNORMAL HIGH (ref 11.5–15.5)
WBC: 32.1 10*3/uL — ABNORMAL HIGH (ref 4.0–10.5)
nRBC: 0.2 % (ref 0.0–0.2)

## 2022-09-29 LAB — CULTURE, OB URINE
Culture: <10000 — AB
Special Requests: NORMAL

## 2022-09-29 LAB — ECHOCARDIOGRAM COMPLETE
Area-P 1/2: 4.31 cm2
Height: 62 in
S' Lateral: 2.7 cm
Weight: 2048 [oz_av]

## 2022-09-29 LAB — COMPREHENSIVE METABOLIC PANEL
ALT: 10 U/L (ref 0–44)
AST: 35 U/L (ref 15–41)
Albumin: 1.8 g/dL — ABNORMAL LOW (ref 3.5–5.0)
Alkaline Phosphatase: 145 U/L — ABNORMAL HIGH (ref 38–126)
Anion gap: 8 (ref 5–15)
BUN: <5 mg/dL — ABNORMAL LOW (ref 6–20)
CO2: 22 mmol/L (ref 22–32)
Calcium: 7.4 mg/dL — ABNORMAL LOW (ref 8.9–10.3)
Chloride: 96 mmol/L — ABNORMAL LOW (ref 98–111)
Creatinine, Ser: 0.53 mg/dL (ref 0.44–1.00)
GFR, Estimated: >60 mL/min (ref >60)
Glucose, Bld: 85 mg/dL (ref 70–99)
Potassium: 3 mmol/L — ABNORMAL LOW (ref 3.5–5.1)
Sodium: 126 mmol/L — ABNORMAL LOW (ref 135–145)
Total Bilirubin: 0.4 mg/dL (ref 0.3–1.2)
Total Protein: 5.4 g/dL — ABNORMAL LOW (ref 6.5–8.1)

## 2022-09-29 LAB — MAGNESIUM: Magnesium: 1.9 mg/dL (ref 1.7–2.4)

## 2022-09-29 LAB — SAVE SMEAR

## 2022-09-29 LAB — HEPATITIS PANEL, ACUTE
HCV Ab: NONREACTIVE
Hep A IgM: NONREACTIVE
Hep B C IgM: NONREACTIVE
Hepatitis B Surface Ag: NONREACTIVE

## 2022-09-29 LAB — LACTIC ACID, PLASMA
Lactic Acid, Venous: 2 mmol/L (ref 0.5–1.9)
Lactic Acid, Venous: 1.3 mmol/L (ref 0.5–1.9)

## 2022-09-29 LAB — HIV ANTIBODY (ROUTINE TESTING W REFLEX): HIV Screen 4th Generation wRfx: NONREACTIVE

## 2022-09-29 LAB — PROCALCITONIN: Procalcitonin: 4.83 ng/mL

## 2022-09-29 MED ORDER — SODIUM CHLORIDE 0.9 % IV SOLN
2.0000 g | Freq: Three times a day (TID) | INTRAVENOUS | Status: DC
  Administered 2022-09-29 – 2022-09-30 (×4): 2 g via INTRAVENOUS
  Filled 2022-09-29 (×5): qty 12.5

## 2022-09-29 MED ORDER — VANCOMYCIN HCL IN DEXTROSE 1-5 GM/200ML-% IV SOLN
1000.0000 mg | Freq: Once | INTRAVENOUS | Status: DC
  Filled 2022-09-29: qty 200

## 2022-09-29 MED ORDER — SODIUM CHLORIDE 0.9 % IV SOLN
2.0000 g | Freq: Once | INTRAVENOUS | Status: DC
  Filled 2022-09-29: qty 12.5

## 2022-09-29 MED ORDER — SODIUM CHLORIDE 0.9 % IV SOLN: 25.0000 mg | Freq: Four times a day (QID) | INTRAVENOUS | Status: DC | PRN

## 2022-09-29 MED ORDER — METRONIDAZOLE 500 MG/100ML IV SOLN
500.0000 mg | Freq: Two times a day (BID) | INTRAVENOUS | Status: DC
  Administered 2022-09-29 – 2022-09-30 (×3): 500 mg via INTRAVENOUS
  Filled 2022-09-29 (×3): qty 100

## 2022-09-29 MED ORDER — MAGNESIUM SULFATE 2 GM/50ML IV SOLN
2.0000 g | Freq: Once | INTRAVENOUS | Status: AC
  Administered 2022-09-29: 2 g via INTRAVENOUS
  Filled 2022-09-29: qty 50

## 2022-09-29 MED ORDER — LACTATED RINGERS IV BOLUS (SEPSIS)
750.0000 mL | Freq: Once | INTRAVENOUS | Status: AC
  Administered 2022-09-29: 750 mL via INTRAVENOUS

## 2022-09-29 MED ORDER — VANCOMYCIN HCL IN DEXTROSE 1-5 GM/200ML-% IV SOLN
1000.0000 mg | Freq: Two times a day (BID) | INTRAVENOUS | Status: DC
  Administered 2022-09-29 – 2022-09-30 (×3): 1000 mg via INTRAVENOUS
  Filled 2022-09-29 (×4): qty 200

## 2022-09-29 MED ORDER — POTASSIUM CHLORIDE 10 MEQ/100ML IV SOLN
10.0000 meq | INTRAVENOUS | Status: AC
  Administered 2022-09-29 (×2): 10 meq via INTRAVENOUS

## 2022-09-29 MED ORDER — SODIUM CHLORIDE 0.9 % IV SOLN
25.0000 mg | Freq: Four times a day (QID) | INTRAVENOUS | Status: DC | PRN
  Administered 2022-09-29 – 2022-10-02 (×7): 25 mg via INTRAVENOUS
  Filled 2022-09-29: qty 1
  Filled 2022-09-29 (×2): qty 25
  Filled 2022-09-29: qty 1
  Filled 2022-09-29 (×3): qty 25

## 2022-09-29 NOTE — Progress Notes (Addendum)
OB Note  09/29/2022 Time: 0330  I was called to assess Ms. Hosaka due to patient shivering under the blankets and borderline low oxygen saturation and headache. Temp 97.9 and BP 90s-110s/50s  Patient states that she she feels better vs when she first came and in and she states that she started feeling like this for the past few days. She also states that she last felt like this back in June/July of this year with a presumptive kidney infection; her urine culture was negative  She states that she had some nausea and just threw up. She denies any OB complaints such as contractions, decreased fetal movement, bleeding or leakage of fluid  94% on RA, HR 140s-150s, BP taken and 120s/50s, RR mid 20s Patient Vitals for the past 24 hrs:  BP Temp Temp src Pulse Resp SpO2 Height Weight  09/29/22 0304 -- -- -- -- -- 94 % -- --  09/29/22 0300 -- 97.9 F (36.6 C) Oral -- -- -- -- --  09/29/22 0258 -- -- -- -- -- (!) 83 % -- --  09/29/22 0216 (!) 93/51 (!) 97.5 F (36.4 C) Oral 86 18 93 % -- --  09/28/22 2234 (!) 100/53 (!) 97.3 F (36.3 C) Oral 85 18 100 % -- --  09/28/22 1724 (!) 117/53 (!) 101.2 F (38.4 C) -- (!) 128 16 -- 5\' 2"  (1.575 m) 58.1 kg   General: patient under blankets and shivering CV: HR in the 140s-150s Pulm: increased RR, no obvious abnormal breath sounds Abdomen: gravid, nttp  A/P: patient with potentially worsening clinical status Unsure of etiology of patient's s/s. She was ordered IV K and her Na is low but the same as it was back in June/July; I ordered 2gm of Mg since it was 1.5. LDH is elevated but this can be due to the placenta.  [facility] wnl as well at [facility] with slightly elevated INR/PT and CRP of 21.8 and Sed rate 47 ECG and [place] oxygen ordered, as well as fetal monitoring [facility] consulted and they'll come and see  Cornelia Copa MD Attending [facility] GYN Consult Phone: [phone] (M-F, [phone]) & [phone]  (Off hours, weekends, holidays)

## 2022-09-29 NOTE — Progress Notes (Signed)
Pharmacy Antibiotic Note  Melissa Little is a 37 y.o. female admitted on 09/28/2022 with sepsis and UTI.  Pharmacy has been consulted for cefepime and vancomycin dosing.  Plan: Vancomycin 1000 IV every 12 hours.  Goal trough 15-20 mcg/mL. Cefepime 2gm IV every 8 hours. Metronidazole 500mg  IV every 12 hours.  Height: 5\' 2"  (157.5 cm) Weight: 58.1 kg (128 lb) IBW/kg (Calculated) : 50.1  Temp (24hrs), Avg:98.7 F (37.1 C), Min:97.3 F (36.3 C), Max:101.2 F (38.4 C)  Recent Labs  Lab 09/28/22 1929 09/29/22 0408  WBC 22.5* 32.1*  CREATININE 0.53 0.53  LATICACIDVEN  --  2.0*    Estimated Creatinine Clearance: 76.2 mL/min (by C-G formula based on SCr of 0.53 mg/dL).    No Known Allergies  Antimicrobials this admission: 9/10 ceftriaxone 2gm x1  Thank you for allowing pharmacy to be a part of this patient's care.  Don Broach 09/29/2022 6:34 AM

## 2022-09-29 NOTE — Progress Notes (Signed)
  Echocardiogram 2D Echocardiogram has been performed.  Delcie Roch 09/29/2022, 12:42 PM

## 2022-09-29 NOTE — Progress Notes (Signed)
PROGRESS NOTE  Melissa Leaks WUJ:[id number] DOB: [birth year] DOA: 09/28/2022 PCP: [facility]  HPI/Recap of past 24 hours: Melissa Little is a 37 y.o. female at [redacted] weeks gestation who denies any significant past medical history, was diagnosed with infectious mononucleosis 1 week ago, presented to the hospital complaining of chills/fever, N/V, suprapubic discomfort with urinary urgency and frequency but denies flank pain. She also reports experiencing fevers intermittently over the past couple months and rash that comes and goes over a longer timeframe. No longer having sore-throat. Extensive infectious work up has been ordered, as well as vasculitic work up as well. There were no acute findings on chest x-ray. Blood and urine cultures still pending. Pt was placed on broad spectrum AB, adequate IVF given. TRH consulted for possible sepsis likely 2/2 UTI/pyelonephritis.    Today, pt denies any new complaints. Denies any N/V, abdominal/flank pain, chest pain, SOB. Reports feeling better this am.    Assessment/Plan: Principal Problem:   Pyelonephritis Active Problems:   [redacted] weeks gestation of pregnancy   Pyelonephritis affecting pregnancy in second trimester   Fever, unknown origin   Sepsis (HCC)  Sepsis  Febrile, tachycardic with leukocytosis on presentation Last temp 101.2 on 9/10, currently with uptrending leukocytosis Likely 2/2 UTI/pyelonephritis UA with large leukocyte, rare bacteria, WBC 21-50, UC pending BC x 2 pending LA 2->WNL Procalcitonin 4.83 Chest x-ray unremarkable Influenza, COVID negative Continue IV fluids, monitor closely Continue cefepime, vancomycin, Flagyl for now until source is identified, and clinical course significantly improved Monitor very closely  Hypokalemia/hypomagnesemia/hyponatremia Replace as needed Continue IV fluids   Rash  Rash has been intermittent in BLE for the past couple months Unsure of etiology, possible  vasculitis Extensive lab workup is in process and she may benefit from rheumatology consultation outpatient      Estimated body mass index is 23.41 kg/m as calculated from the following:   Height as of this encounter: 5\' 2"  (1.575 m).   Weight as of this encounter: 58.1 kg.     Code Status: Full  Family Communication: None at bedside  Disposition Plan: Status is: Inpatient Remains inpatient appropriate because: Level of care      Consultants: [facility]  Procedures: None  Antimicrobials: Vancomycin Cefepime Metronidazole  DVT prophylaxis: SCDs   Objective: Vitals:   09/29/22 1035 09/29/22 1040 09/29/22 1045 09/29/22 1056  BP:    (!) 96/53  Pulse:    93  Resp:    16  Temp:    97.7 F (36.5 C)  TempSrc:    Oral  SpO2: 97% 96% 97% 97%  Weight:      Height:        Intake/Output Summary (Last 24 hours) at 09/29/2022 1246 Last data filed at 09/29/2022 0051 Gross per 24 hour  Intake 98.72 ml  Output --  Net 98.72 ml   Filed Weights   09/28/22 1724  Weight: 58.1 kg    Exam: General: NAD  Cardiovascular: S1, S2 present Respiratory: CTAB Abdomen: Gravid, nontender Musculoskeletal: No bilateral pedal edema noted Skin: Noted faint rash in bilateral lower extremities Psychiatry: Normal mood     Data Reviewed: CBC: Recent Labs  Lab 09/28/22 1929 09/29/22 0408  WBC 22.5* 32.1*  NEUTROABS 20.3*  --   HGB 9.6* 9.4*  HCT 28.8* 28.4*  MCV 77.6* 78.0*  PLT 215 195   Basic Metabolic Panel: Recent Labs  Lab 09/28/22 1929 09/29/22 0408  NA 125* 126*  K 2.3* 3.0*  CL 91* 96*  CO2 23 22  GLUCOSE 79 85  BUN 5* <5*  CREATININE 0.53 0.53  CALCIUM 7.8* 7.4*  MG 1.5*  --    GFR: Estimated Creatinine Clearance: 76.2 mL/min (by C-G formula based on SCr of 0.53 mg/dL). Liver Function Tests: Recent Labs  Lab 09/28/22 1929 09/29/22 0408  AST 40 35  ALT 11 10  ALKPHOS 144* 145*  BILITOT 0.6 0.4  PROT 6.1* 5.4*  ALBUMIN 2.1* 1.8*    No results for input(s): "LIPASE", "AMYLASE" in the last 168 hours. No results for input(s): "AMMONIA" in the last 168 hours. Coagulation Profile: Recent Labs  Lab 09/28/22 1929  INR 1.4*   Cardiac Enzymes: No results for input(s): "CKTOTAL", "CKMB", "CKMBINDEX", "TROPONINI" in the last 168 hours. BNP (last 3 results) No results for input(s): "PROBNP" in the last 8760 hours. HbA1C: No results for input(s): "HGBA1C" in the last 72 hours. CBG: No results for input(s): "GLUCAP" in the last 168 hours. Lipid Profile: No results for input(s): "CHOL", "HDL", "LDLCALC", "TRIG", "CHOLHDL", "LDLDIRECT" in the last 72 hours. Thyroid Function Tests: Recent Labs    09/28/22 1929  TSH 2.138   Anemia Panel: No results for input(s): "VITAMINB12", "FOLATE", "FERRITIN", "TIBC", "IRON", "RETICCTPCT" in the last 72 hours. Urine analysis:    Component Value Date/Time   COLORURINE YELLOW 09/28/2022 1737   APPEARANCEUR CLOUDY (A) 09/28/2022 1737   LABSPEC <1.005 (L) 09/28/2022 1737   PHURINE 6.5 09/28/2022 1737   GLUCOSEU NEGATIVE 09/28/2022 1737   HGBUR LARGE (A) 09/28/2022 1737   BILIRUBINUR SMALL (A) 09/28/2022 1737   KETONESUR 15 (A) 09/28/2022 1737   PROTEINUR 30 (A) 09/28/2022 1737   UROBILINOGEN >=8.0 08/12/2022 1034   NITRITE NEGATIVE 09/28/2022 1737   LEUKOCYTESUR LARGE (A) 09/28/2022 1737   Sepsis Labs: @LABRCNTIP (procalcitonin:4,lacticidven:4)  ) Recent Results (from the past 240 hour(s))  Resp panel by RT-PCR (RSV, Flu A&B, Covid) Anterior Nasal Swab     Status: None   Collection Time: 09/21/22  8:02 PM   Specimen: Anterior Nasal Swab  Result Value Ref Range Status   SARS Coronavirus 2 by RT PCR NEGATIVE NEGATIVE Final   Influenza A by PCR NEGATIVE NEGATIVE Final   Influenza B by PCR NEGATIVE NEGATIVE Final    Comment: (NOTE) The Xpert Xpress SARS-CoV-2/FLU/RSV plus assay is intended as an aid in the diagnosis of influenza from Nasopharyngeal swab specimens  and should not be used as a sole basis for treatment. Nasal washings and aspirates are unacceptable for Xpert Xpress SARS-CoV-2/FLU/RSV testing.  Fact Sheet for Patients: BloggerCourse.com  Fact Sheet for Healthcare Providers: SeriousBroker.it  This test is not yet approved or cleared by the [place] FDA and has been authorized for detection and/or diagnosis of SARS-CoV-2 by FDA under an Emergency Use Authorization (EUA). This EUA will remain in effect (meaning this test can be used) for the duration of the COVID-19 declaration under Section 564(b)(1) of the Act, 21 U.S.C. section 360bbb-3(b)(1), unless the authorization is terminated or revoked.     Resp Syncytial Virus by PCR NEGATIVE NEGATIVE Final    Comment: (NOTE) Fact Sheet for Patients: BloggerCourse.com  Fact Sheet for Healthcare Providers: SeriousBroker.it  This test is not yet approved or cleared by the [place] FDA and has been authorized for detection and/or diagnosis of SARS-CoV-2 by FDA under an Emergency Use Authorization (EUA). This EUA will remain in effect (meaning this test can be used) for the duration of the COVID-19 declaration under Section 564(b)(1) of the Act, 21 U.S.C. section  360bbb-3(b)(1), unless the authorization is terminated or revoked.  Performed at [facility], [address] [place], [place] [address]   Group A Strep by PCR     Status: None   Collection Time: 09/21/22  8:33 PM   Specimen: Throat; Sterile Swab  Result Value Ref Range Status   Group A Strep by PCR NOT DETECTED NOT DETECTED Final    Comment: Performed at [facility], [address], [place], [place] [address]  Resp panel by RT-PCR (RSV, Flu A&B, Covid) Anterior Nasal Swab     Status: None   Collection Time: 09/28/22  5:37 PM   Specimen: Anterior Nasal Swab  Result Value Ref Range Status   SARS  Coronavirus 2 by RT PCR NEGATIVE NEGATIVE Final   Influenza A by PCR NEGATIVE NEGATIVE Final   Influenza B by PCR NEGATIVE NEGATIVE Final    Comment: (NOTE) The Xpert Xpress SARS-CoV-2/FLU/RSV plus assay is intended as an aid in the diagnosis of influenza from Nasopharyngeal swab specimens and should not be used as a sole basis for treatment. Nasal washings and aspirates are unacceptable for Xpert Xpress SARS-CoV-2/FLU/RSV testing.  Fact Sheet for Patients: BloggerCourse.com  Fact Sheet for Healthcare Providers: SeriousBroker.it  This test is not yet approved or cleared by the [place] FDA and has been authorized for detection and/or diagnosis of SARS-CoV-2 by FDA under an Emergency Use Authorization (EUA). This EUA will remain in effect (meaning this test can be used) for the duration of the COVID-19 declaration under Section 564(b)(1) of the Act, 21 U.S.C. section 360bbb-3(b)(1), unless the authorization is terminated or revoked.     Resp Syncytial Virus by PCR NEGATIVE NEGATIVE Final    Comment: (NOTE) Fact Sheet for Patients: BloggerCourse.com  Fact Sheet for Healthcare Providers: SeriousBroker.it  This test is not yet approved or cleared by the [place] FDA and has been authorized for detection and/or diagnosis of SARS-CoV-2 by FDA under an Emergency Use Authorization (EUA). This EUA will remain in effect (meaning this test can be used) for the duration of the COVID-19 declaration under Section 564(b)(1) of the Act, 21 U.S.C. section 360bbb-3(b)(1), unless the authorization is terminated or revoked.  Performed at [facility], [address] [place], [place] [address]   Culture, blood (Routine X 2) w Reflex to ID Panel     Status: None (Preliminary result)   Collection Time: 09/28/22  8:25 PM   Specimen: BLOOD  Result Value Ref Range Status   Specimen  Description BLOOD RIGHT ANTECUBITAL  Final   Special Requests   Final    BOTTLES DRAWN AEROBIC AND ANAEROBIC Blood Culture adequate volume   Culture   Final    NO GROWTH < 12 HOURS Performed at [facility], [address] [place], [place] [address]    Report Status PENDING  Incomplete  Culture, blood (Routine X 2) w Reflex to ID Panel     Status: None (Preliminary result)   Collection Time: 09/28/22  8:27 PM   Specimen: BLOOD  Result Value Ref Range Status   Specimen Description BLOOD RIGHT ANTECUBITAL  Final   Special Requests   Final    BOTTLES DRAWN AEROBIC AND ANAEROBIC Blood Culture adequate volume   Culture   Final    NO GROWTH < 12 HOURS Performed at [facility], [address] [place], [place] [address]    Report Status PENDING  Incomplete      Studies: US  RENAL  Result Date: 09/29/2022 CLINICAL DATA:  37 year old female is pregnant in the 2nd trimester. Pyelonephritis. EXAM: RENAL / URINARY TRACT ULTRASOUND COMPLETE COMPARISON:  Renal ultrasound 08/12/2022. FINDINGS: Right Kidney: Renal measurements: 12.1 x 5.0 x 6.3 cm = volume: 197 mL. Echogenicity within normal limits, maintained corticomedullary differentiation. No mass or hydronephrosis visualized. Left Kidney: Renal measurements: 10.6 x 6.5 x 5.2 cm = volume: 189 mL. Echogenicity within normal limits. Normal corticomedullary differentiation. No mass or hydronephrosis visualized. Bladder: Decompressed and poorly visible. Other: More symmetric renal vascularity on brief color Doppler images today (images 5 and 28). Partially visible gravid uterus. IMPRESSION: 1. Normal ultrasound appearance of both kidneys. Decompressed bladder. 2. Partially visible gravid uterus. Electronically Signed   By: Odessa Fleming M.D.   On: 09/29/2022 06:36   DG Chest 2 View  Result Date: 09/28/2022 CLINICAL DATA:  Sepsis EXAM: CHEST - 2 VIEW COMPARISON:  Chest x-ray with ribs 11/19/2005 FINDINGS: The heart size and mediastinal contours  are within normal limits. Both lungs are clear. The visualized skeletal structures are unremarkable. IMPRESSION: No active cardiopulmonary disease. Electronically Signed   By: Darliss Cheney M.D.   On: 09/28/2022 23:26   [place] MFM OB FOLLOW UP  Result Date: 09/28/2022 ----------------------------------------------------------------------  OBSTETRICS REPORT                       (Signed Final 09/28/2022 01:18 pm) ---------------------------------------------------------------------- Patient Info  ID #:       [id number]                          D.O.B.:  May 11, 1985 (36 yrs)  Name:       Melissa Little                    Visit Date: 09/28/2022 12:38 pm ---------------------------------------------------------------------- Performed By  Attending:        Lin Landsman      Ref. Address:     [address]                    [place]                                                             [place], [place]                                                             [address]  Performed By:     Alain Marion     [facility]  Referred By:      Reva Bores                    MD ---------------------------------------------------------------------- Orders  #  Description                           Code        Ordered By  1  [place] MFM OB FOLLOW UP                   [id number]    Braxton Feathers ----------------------------------------------------------------------  #  Order #                     Accession #                Episode #  1  [id number]                   [id number]                 [id number] ---------------------------------------------------------------------- Indications  Echogenic intracardiac focus of the heart      O35.88XX0  (EIF)  Advanced maternal age multigravida 55+,        O33.522   second trimester (36 yrs)  Antenatal follow-up for nonvisualized fetal    Z36.2  anatomy  [redacted] weeks gestation of pregnancy                Z3A.26  LR NIPS, Neg horizon ---------------------------------------------------------------------- Fetal Evaluation  Num Of Fetuses:         1  Fetal Heart Rate(bpm):  148  Cardiac Activity:       Observed  Presentation:           Cephalic  Placenta:               Posterior Fundal  AFI Sum(cm)     %Tile       Largest Pocket(cm)  8.88            3.5         3.52  RUQ(cm)       RLQ(cm)       LUQ(cm)        LLQ(cm)  1.71          3.52          2.05           1.6 ---------------------------------------------------------------------- Biometry  BPD:      67.2  mm     G. Age:  27w 0d         57  %    CI:        77.92   %    70 - 86                                                          FL/HC:  20.0   %    18.6 - 20.4  HC:      240.9  mm     G. Age:  26w 1d         14  %    HC/AC:      1.10        1.05 - 1.21  AC:      218.4  mm     G. Age:  26w 2d         33  %    FL/BPD:     71.9   %    71 - 87  FL:       48.3  mm     G. Age:  26w 1d         25  %    FL/AC:      22.1   %    20 - 24  Est. FW:     917  gm           2 lb     27  % ---------------------------------------------------------------------- OB History  Gravidity:    6         Term:   4        Prem:   0        SAB:   1  TOP:          0       Ectopic:  0        Living: 4 ---------------------------------------------------------------------- Gestational Age  LMP:           27w 0d        Date:  03/23/22                   EDD:   12/28/22  U/S Today:     26w 3d                                        EDD:   01/01/23  Best:          26w 4d     Det. By:  Marcella Dubs         EDD:   12/31/22                                      (06/30/22) ---------------------------------------------------------------------- Anatomy  Cranium:               Appears normal         Kidneys:                Appear normal  Diaphragm:             Appears  normal         Bladder:                Appears normal  Stomach:               Appears normal, left   Spine:                  Ltd views no                         sided  intracranial signs of                                                                        NTD  Abdomen:               Appears normal  Other:  Open hands visualized. Other anatomy previously seen. ---------------------------------------------------------------------- Impression  Follow up growth due to complete the fetal anatomy and AMA  Normal interval growth with measurements consistent with  dates  Good fetal movement and amniotic fluid volume  Anatomy completed. ---------------------------------------------------------------------- Recommendations  Follow up growth at 32 weeks was scheduled. ----------------------------------------------------------------------              Lin Landsman, MD Electronically Signed Final Report   09/28/2022 01:18 pm ----------------------------------------------------------------------    Scheduled Meds:  docusate sodium  100 mg Oral Daily   ferrous sulfate  325 mg Oral QODAY   potassium chloride  40 mEq Oral BID   prenatal multivitamin  1 tablet Oral Q1200    Continuous Infusions:  sodium chloride 125 mL/hr at 09/29/22 1229   ceFEPime (MAXIPIME) IV 2 g (09/29/22 0735)   metronidazole 500 mg (09/29/22 0944)   vancomycin 1,000 mg (09/29/22 0832)     LOS: 1 day     Briant Cedar, MD [facility]  If 7PM-7AM, please contact night-coverage [email or web address] 09/29/2022, 12:46 PM

## 2022-09-29 NOTE — Plan of Care (Signed)
  Problem: Education: Goal: Knowledge of disease or condition will improve Outcome: Progressing Goal: Knowledge of the prescribed therapeutic regimen will improve Outcome: Progressing   Problem: Clinical Measurements: Goal: Complications related to the disease process, condition or treatment will be avoided or minimized Outcome: Progressing   Problem: Education: Goal: Knowledge of General Education information will improve Description: Including pain rating scale, medication(s)/side effects and non-pharmacologic comfort measures Outcome: Progressing   Problem: Clinical Measurements: Goal: Ability to maintain clinical measurements within normal limits will improve Outcome: Progressing Goal: Will remain free from infection Outcome: Progressing Goal: Diagnostic test results will improve Outcome: Progressing Goal: Respiratory complications will improve Outcome: Progressing Goal: Cardiovascular complication will be avoided Outcome: Progressing   Problem: Clinical Measurements: Goal: Diagnostic test results will improve Outcome: Progressing Goal: Signs and symptoms of infection will decrease Outcome: Progressing   Problem: Respiratory: Goal: Ability to maintain adequate ventilation will improve Outcome: Progressing

## 2022-09-29 NOTE — Consult Note (Signed)
Initial Consultation Note   Patient: Melissa Little RUE:[id number] DOB: 09-13-1985 PCP: [facility] DOA: 09/28/2022 DOS: the patient was seen and examined on 09/29/2022 Primary service: Reva Bores, MD  Referring physician: Dr. Vergie Living   Reason for consult: Rigors, SBP 90s, HR 140-150s  Assessment/Plan:  1. Sepsis  - Febrile and tachycardic on presentation with leukocytosis  - She reports urinary urgency and frequency, has clear CXR, benign abdominal exam, no meningismus or apparent cellulitis, and urine seems to be most likely source  - Given severity of illness with increasing HR, decreasing SBP, and now rigors, plan to check/trend lactate, complete a 30 cc/kg bolus, and broaden antibiotics while following cultures and clinical course   2. Rash  - Has come and gone several times over the course of months per pt report and is currently fading  - Rash is currently fading and may not be related to the current illness  - Extensive lab workup is in process and she may benefit from rheumatology consultation    TRH will continue to follow the patient.   HPI: Melissa Little is a 37 y.o. female at [redacted] weeks gestation who denies any significant past medical history, was diagnosed with infectious mononucleosis 1 week ago, presented to the hospital last night complaining of chills, headache, dry mouth, nausea, and vomiting.  She reports experiencing fevers intermittently over the past couple months and rash that comes and goes over a longer timeframe.  Her recent sore throat has resolved and she had been feeling better after the mononucleosis diagnosis until yesterday when she developed headache, chills, nausea, vomiting, and felt as though she was dehydrated.  She also reports suprapubic discomfort with urinary urgency and frequency but denies flank pain.  She denies cough.  She denies shortness of breath currently but had reported feeling dyspneic a couple hours ago.  She  was febrile to 38.4 C last night with tachycardia, WBC 22,500, and SBP as low as 93.  There were no acute findings on chest x-ray.  She had blood and urine cultures drawn, was given a liter of LR and 2 g IV Rocephin, and admitted for presumptive pyelonephritis.   Review of Systems: As mentioned in the history of present illness. All other systems reviewed and are negative. Past Medical History:  Diagnosis Date   Anemia    Past Surgical History:  Procedure Laterality Date   NO PAST SURGERIES     Social History:  reports that she has never smoked. She has never used smokeless tobacco. She reports that she does not currently use alcohol. She reports that she does not currently use drugs after having used the following drugs: Marijuana.  No Known Allergies  Family History  Problem Relation Age of Onset   COPD Father    Lung cancer Paternal Grandfather     Prior to Admission medications   Medication Sig Start Date End Date Taking? Authorizing Provider  Ferrous Sulfate (IRON) 325 (65 Fe) MG TABS Take 1 tablet (325 mg total) by mouth every other day. 08/12/22  Yes Rasch, Harolyn Rutherford, NP  guaiFENesin (MUCINEX) 600 MG 12 hr tablet Take 1 tablet (600 mg total) by mouth 2 (two) times daily as needed for cough. 09/22/22  Yes Aviva Signs, CNM  ondansetron (ZOFRAN) 8 MG tablet Take 1 tablet (8 mg total) by mouth every 8 (eight) hours as needed for nausea or vomiting. 08/12/22  Yes Rasch, Harolyn Rutherford, NP  Prenatal 28-0.8 MG TABS Take 1 tablet  by mouth daily. 07/01/22  Yes [provider]  Misc. Throat Products (AYR THROAT SPRAY) LIQD Use as directed 1 spray in the mouth or throat every 8 (eight) hours as needed (sore throat). 09/22/22   Aviva Signs, CNM  potassium chloride SA (KLOR-CON M) 20 MEQ tablet Take 1 tablet (20 mEq total) by mouth daily. Patient not taking: Reported on 09/21/2022 08/12/22   Venia Carbon I, NP    Physical Exam: Vitals:   09/29/22 0350 09/29/22 0355 09/29/22  0400 09/29/22 0425  BP:      Pulse:      Resp:      Temp:      TempSrc:      SpO2: 98% 97% 98% 99%  Weight:      Height:        Constitutional: NAD, calm  Eyes: PERTLA, lids and conjunctivae normal ENMT: Mucous membranes are moist. Posterior pharynx clear of any exudate or lesions.   Neck: supple, no masses  Respiratory: no wheezing, no crackles. No accessory muscle use.  Cardiovascular: Rate ~120 and regular. No extremity edema.  Abdomen: Gravid. Non-tender. Bowel sounds active.  Musculoskeletal: no clubbing / cyanosis. No joint deformity upper and lower extremities.   Skin: Faint hyperpigmented macules in clusters about the LEs bilaterally, appear to be fading. Warm, dry, well-perfused. Neurologic: CN 2-12 grossly intact. Moving all extremities. Alert and oriented.  Psychiatric: Pleasant. Cooperative.     Data Reviewed: Vitals, labs, imaging, chart notes.   Family Communication: None present   Primary team communication: Discussed case with Dr. Vergie Living by phone and with RN at bedside.   Thank you very much for involving [place] in the care of your patient.  Author: Briscoe Deutscher, MD 09/29/2022 4:45 AM  For on call review [email or web address].

## 2022-09-30 ENCOUNTER — Inpatient Hospital Stay (HOSPITAL_COMMUNITY): Payer: Medicaid Other

## 2022-09-30 DIAGNOSIS — N12 Tubulo-interstitial nephritis, not specified as acute or chronic: Secondary | ICD-10-CM | POA: Diagnosis not present

## 2022-09-30 LAB — CBC WITH DIFFERENTIAL/PLATELET
Abs Immature Granulocytes: 2.61 10*3/uL — ABNORMAL HIGH (ref 0.00–0.07)
Basophils Absolute: 0.1 10*3/uL (ref 0.0–0.1)
Basophils Relative: 1 %
Eosinophils Absolute: 0.1 10*3/uL (ref 0.0–0.5)
Eosinophils Relative: 1 %
HCT: 26 % — ABNORMAL LOW (ref 36.0–46.0)
Hemoglobin: 8.3 g/dL — ABNORMAL LOW (ref 12.0–15.0)
Immature Granulocytes: 17 %
Lymphocytes Relative: 5 %
Lymphs Abs: 0.8 10*3/uL (ref 0.7–4.0)
MCH: 25.6 pg — ABNORMAL LOW (ref 26.0–34.0)
MCHC: 31.9 g/dL (ref 30.0–36.0)
MCV: 80.2 fL (ref 80.0–100.0)
Monocytes Absolute: 0.6 10*3/uL (ref 0.1–1.0)
Monocytes Relative: 4 %
Neutro Abs: 11.1 10*3/uL — ABNORMAL HIGH (ref 1.7–7.7)
Neutrophils Relative %: 72 %
Platelets: 191 10*3/uL (ref 150–400)
RBC: 3.24 MIL/uL — ABNORMAL LOW (ref 3.87–5.11)
RDW: 20.5 % — ABNORMAL HIGH (ref 11.5–15.5)
Smear Review: ADEQUATE
WBC: 15.4 10*3/uL — ABNORMAL HIGH (ref 4.0–10.5)
nRBC: 0.1 % (ref 0.0–0.2)

## 2022-09-30 LAB — BASIC METABOLIC PANEL
Anion gap: 14 (ref 5–15)
BUN: <5 mg/dL — ABNORMAL LOW (ref 6–20)
CO2: 18 mmol/L — ABNORMAL LOW (ref 22–32)
Calcium: 7.6 mg/dL — ABNORMAL LOW (ref 8.9–10.3)
Chloride: 104 mmol/L (ref 98–111)
Creatinine, Ser: 0.6 mg/dL (ref 0.44–1.00)
GFR, Estimated: >60 mL/min (ref >60)
Glucose, Bld: 93 mg/dL (ref 70–99)
Potassium: 3.1 mmol/L — ABNORMAL LOW (ref 3.5–5.1)
Sodium: 136 mmol/L (ref 135–145)

## 2022-09-30 LAB — ANA W/REFLEX IF POSITIVE: Anti Nuclear Antibody (ANA): NEGATIVE

## 2022-09-30 LAB — ANCA PROFILE
Anti-MPO Antibodies: <0.2 U (ref 0.0–0.9)
Anti-PR3 Antibodies: <0.2 U (ref 0.0–0.9)
Atypical P-ANCA titer: <1:20 {titer}
C-ANCA: <1:20 {titer}
P-ANCA: <1:20 {titer}

## 2022-09-30 LAB — SPOTTED FEVER GROUP ANTIBODIES
Spotted Fever Group IgG: <1:64 {titer}
Spotted Fever Group IgM: <1:64 {titer}

## 2022-09-30 LAB — C DIFFICILE QUICK SCREEN W PCR REFLEX
C Diff antigen: NEGATIVE
C Diff interpretation: NOT DETECTED
C Diff toxin: NEGATIVE

## 2022-09-30 LAB — EPSTEIN BARR VRS(EBV DNA BY PCR): EBV DNA QN by PCR: NEGATIVE [IU]/mL

## 2022-09-30 LAB — RPR: RPR Ser Ql: NONREACTIVE

## 2022-09-30 LAB — SJOGRENS SYNDROME-B EXTRACTABLE NUCLEAR ANTIBODY: SSB (La) (ENA) Antibody, IgG: <0.2 AI (ref 0.0–0.9)

## 2022-09-30 LAB — ANGIOTENSIN CONVERTING ENZYME: Angiotensin-Converting Enzyme: 98 U/L — ABNORMAL HIGH (ref 14–82)

## 2022-09-30 LAB — SJOGRENS SYNDROME-A EXTRACTABLE NUCLEAR ANTIBODY: SSA (Ro) (ENA) Antibody, IgG: <0.2 AI (ref 0.0–0.9)

## 2022-09-30 MED ORDER — SODIUM CHLORIDE 0.9 % IV SOLN: 2.0000 g | INTRAVENOUS | Status: DC

## 2022-09-30 MED ORDER — LACTATED RINGERS IV SOLN: INTRAVENOUS | Status: DC

## 2022-09-30 MED ORDER — ACETAMINOPHEN 325 MG PO TABS
650.0000 mg | ORAL_TABLET | Freq: Four times a day (QID) | ORAL | Status: DC | PRN
  Administered 2022-10-01 – 2022-10-04 (×8): 650 mg via ORAL
  Filled 2022-09-30 (×9): qty 2

## 2022-09-30 MED ORDER — CYCLOBENZAPRINE HCL 10 MG PO TABS: 5.0000 mg | ORAL_TABLET | Freq: Three times a day (TID) | ORAL | Status: DC | PRN

## 2022-09-30 MED ORDER — ONDANSETRON HCL 4 MG/2ML IJ SOLN
4.0000 mg | Freq: Four times a day (QID) | INTRAMUSCULAR | Status: DC | PRN
  Administered 2022-10-02 – 2022-10-03 (×3): 4 mg via INTRAVENOUS
  Filled 2022-09-30 (×3): qty 2

## 2022-09-30 MED ORDER — LACTATED RINGERS IV BOLUS
500.0000 mL | Freq: Once | INTRAVENOUS | Status: AC
  Administered 2022-09-30: 500 mL via INTRAVENOUS

## 2022-09-30 NOTE — Progress Notes (Signed)
PROGRESS NOTE  Melissa Little ZOX:[id number] DOB: [birth year] DOA: 09/28/2022 PCP: [facility]  HPI/Recap of past 24 hours: Melissa Leisure is a 37 y.o. female at [redacted] weeks gestation who denies any significant past medical history, was diagnosed with infectious mononucleosis 1 week ago, presented to the hospital complaining of chills/fever, N/V, suprapubic discomfort with urinary urgency and frequency but denies flank pain. She also reports experiencing fevers intermittently over the past couple months and rash that comes and goes over a longer timeframe. No longer having sore-throat. Extensive infectious work up has been ordered, as well as vasculitic work up as well. There were no acute findings on chest x-ray. Blood and urine cultures still pending. Pt was placed on broad spectrum AB, adequate IVF given. TRH consulted for possible sepsis likely 2/2 UTI/pyelonephritis.    Today, pt c/o nausea and vomiting.    Assessment/Plan: Principal Problem:   Pyelonephritis Active Problems:   [redacted] weeks gestation of pregnancy   Pyelonephritis affecting pregnancy in second trimester   Fever, unknown origin   Sepsis (HCC)  Sepsis  Febrile, tachycardic with leukocytosis on presentation Last temp 101.5 on 9/12, currently with downtrending leukocytosis Unsure Etiology UA with large leukocyte, rare bacteria, WBC 21-50, UC insig rowth BC x 2 NGTD LA 2->WNL Procalcitonin 4.83 Chest x-ray unremarkable Influenza, COVID negative Continue IV fluids, monitor closely Discontinued cefepime, vancomycin, Flagyl  ID consulted for further management   Hypokalemia/hypomagnesemia/hyponatremia Replace as needed Continue IV fluids   Rash  Rash has been intermittent in BLE for the past couple months Unsure of etiology, possible vasculitis Extensive lab workup is in process and she may benefit from rheumatology consultation outpatient      Estimated body mass index is 23.41 kg/m as  calculated from the following:   Height as of this encounter: 5\' 2"  (1.575 m).   Weight as of this encounter: 58.1 kg.     Code Status: Full  Family Communication: None at bedside  Disposition Plan: Status is: Inpatient Remains inpatient appropriate because: Level of care      Consultants: [facility]  Procedures: None  Antimicrobials: None  DVT prophylaxis: SCDs   Objective: Vitals:   09/30/22 0745 09/30/22 1117 09/30/22 1503 09/30/22 1600  BP: (!) 110/58  (!) 126/51   Pulse: 92  (!) 117   Resp: 18  18   Temp: 97.7 F (36.5 C) 98.3 F (36.8 C) (!) 101.5 F (38.6 C) (!) 100.7 F (38.2 C)  TempSrc: Oral Oral Oral   SpO2:   95%   Weight:      Height:        Intake/Output Summary (Last 24 hours) at 09/30/2022 1842 Last data filed at 09/30/2022 1100 Gross per 24 hour  Intake 2281.29 ml  Output 100 ml  Net 2181.29 ml   Filed Weights   09/28/22 1724  Weight: 58.1 kg    Exam: General: NAD  Cardiovascular: S1, S2 present Respiratory: CTAB Abdomen: Gravid, nontender Musculoskeletal: No bilateral pedal edema noted Skin: Noted faint rash in bilateral lower extremities Psychiatry: Normal mood     Data Reviewed: CBC: Recent Labs  Lab 09/28/22 1929 09/29/22 0408 09/30/22 0530  WBC 22.5* 32.1* 15.4*  NEUTROABS 20.3*  --  11.1*  HGB 9.6* 9.4* 8.3*  HCT 28.8* 28.4* 26.0*  MCV 77.6* 78.0* 80.2  PLT 215 195 191   Basic Metabolic Panel: Recent Labs  Lab 09/28/22 1929 09/29/22 0408 09/29/22 1453 09/30/22 0530  NA 125* 126*  --  136  K 2.3* 3.0*  --  3.1*  CL 91* 96*  --  104  CO2 23 22  --  18*  GLUCOSE 79 85  --  93  BUN 5* <5*  --  <5*  CREATININE 0.53 0.53  --  0.60  CALCIUM 7.8* 7.4*  --  7.6*  MG 1.5*  --  1.9  --    GFR: Estimated Creatinine Clearance: 76.2 mL/min (by C-G formula based on SCr of 0.6 mg/dL). Liver Function Tests: Recent Labs  Lab 09/28/22 1929 09/29/22 0408  AST 40 35  ALT 11 10  ALKPHOS 144* 145*   BILITOT 0.6 0.4  PROT 6.1* 5.4*  ALBUMIN 2.1* 1.8*   No results for input(s): "LIPASE", "AMYLASE" in the last 168 hours. No results for input(s): "AMMONIA" in the last 168 hours. Coagulation Profile: Recent Labs  Lab 09/28/22 1929  INR 1.4*   Cardiac Enzymes: No results for input(s): "CKTOTAL", "CKMB", "CKMBINDEX", "TROPONINI" in the last 168 hours. BNP (last 3 results) No results for input(s): "PROBNP" in the last 8760 hours. HbA1C: No results for input(s): "HGBA1C" in the last 72 hours. CBG: No results for input(s): "GLUCAP" in the last 168 hours. Lipid Profile: No results for input(s): "CHOL", "HDL", "LDLCALC", "TRIG", "CHOLHDL", "LDLDIRECT" in the last 72 hours. Thyroid Function Tests: Recent Labs    09/28/22 1929  TSH 2.138   Anemia Panel: No results for input(s): "VITAMINB12", "FOLATE", "FERRITIN", "TIBC", "IRON", "RETICCTPCT" in the last 72 hours. Urine analysis:    Component Value Date/Time   COLORURINE YELLOW 09/28/2022 1737   APPEARANCEUR CLOUDY (A) 09/28/2022 1737   LABSPEC <1.005 (L) 09/28/2022 1737   PHURINE 6.5 09/28/2022 1737   GLUCOSEU NEGATIVE 09/28/2022 1737   HGBUR LARGE (A) 09/28/2022 1737   BILIRUBINUR SMALL (A) 09/28/2022 1737   KETONESUR 15 (A) 09/28/2022 1737   PROTEINUR 30 (A) 09/28/2022 1737   UROBILINOGEN >=8.0 08/12/2022 1034   NITRITE NEGATIVE 09/28/2022 1737   LEUKOCYTESUR LARGE (A) 09/28/2022 1737   Sepsis Labs: @LABRCNTIP (procalcitonin:4,lacticidven:4)  ) Recent Results (from the past 240 hour(s))  Resp panel by RT-PCR (RSV, Flu A&B, Covid) Anterior Nasal Swab     Status: None   Collection Time: 09/21/22  8:02 PM   Specimen: Anterior Nasal Swab  Result Value Ref Range Status   SARS Coronavirus 2 by RT PCR NEGATIVE NEGATIVE Final   Influenza A by PCR NEGATIVE NEGATIVE Final   Influenza B by PCR NEGATIVE NEGATIVE Final    Comment: (NOTE) The Xpert Xpress SARS-CoV-2/FLU/RSV plus assay is intended as an aid in the diagnosis of  influenza from Nasopharyngeal swab specimens and should not be used as a sole basis for treatment. Nasal washings and aspirates are unacceptable for Xpert Xpress SARS-CoV-2/FLU/RSV testing.  Fact Sheet for Patients: BloggerCourse.com  Fact Sheet for Healthcare Providers: SeriousBroker.it  This test is not yet approved or cleared by the [place] FDA and has been authorized for detection and/or diagnosis of SARS-CoV-2 by FDA under an Emergency Use Authorization (EUA). This EUA will remain in effect (meaning this test can be used) for the duration of the COVID-19 declaration under Section 564(b)(1) of the Act, 21 U.S.C. section 360bbb-3(b)(1), unless the authorization is terminated or revoked.     Resp Syncytial Virus by PCR NEGATIVE NEGATIVE Final    Comment: (NOTE) Fact Sheet for Patients: BloggerCourse.com  Fact Sheet for Healthcare Providers: SeriousBroker.it  This test is not yet approved or cleared by the [place] FDA and has been authorized for detection  and/or diagnosis of SARS-CoV-2 by FDA under an Emergency Use Authorization (EUA). This EUA will remain in effect (meaning this test can be used) for the duration of the COVID-19 declaration under Section 564(b)(1) of the Act, 21 U.S.C. section 360bbb-3(b)(1), unless the authorization is terminated or revoked.  Performed at [facility], [address] [place], [place] [address]   Group A Strep by PCR     Status: None   Collection Time: 09/21/22  8:33 PM   Specimen: Throat; Sterile Swab  Result Value Ref Range Status   Group A Strep by PCR NOT DETECTED NOT DETECTED Final    Comment: Performed at [facility], [address], [place], [place] [address]  Resp panel by RT-PCR (RSV, Flu A&B, Covid) Anterior Nasal Swab     Status: None   Collection Time: 09/28/22  5:37 PM   Specimen: Anterior Nasal Swab   Result Value Ref Range Status   SARS Coronavirus 2 by RT PCR NEGATIVE NEGATIVE Final   Influenza A by PCR NEGATIVE NEGATIVE Final   Influenza B by PCR NEGATIVE NEGATIVE Final    Comment: (NOTE) The Xpert Xpress SARS-CoV-2/FLU/RSV plus assay is intended as an aid in the diagnosis of influenza from Nasopharyngeal swab specimens and should not be used as a sole basis for treatment. Nasal washings and aspirates are unacceptable for Xpert Xpress SARS-CoV-2/FLU/RSV testing.  Fact Sheet for Patients: BloggerCourse.com  Fact Sheet for Healthcare Providers: SeriousBroker.it  This test is not yet approved or cleared by the [place] FDA and has been authorized for detection and/or diagnosis of SARS-CoV-2 by FDA under an Emergency Use Authorization (EUA). This EUA will remain in effect (meaning this test can be used) for the duration of the COVID-19 declaration under Section 564(b)(1) of the Act, 21 U.S.C. section 360bbb-3(b)(1), unless the authorization is terminated or revoked.     Resp Syncytial Virus by PCR NEGATIVE NEGATIVE Final    Comment: (NOTE) Fact Sheet for Patients: BloggerCourse.com  Fact Sheet for Healthcare Providers: SeriousBroker.it  This test is not yet approved or cleared by the [place] FDA and has been authorized for detection and/or diagnosis of SARS-CoV-2 by FDA under an Emergency Use Authorization (EUA). This EUA will remain in effect (meaning this test can be used) for the duration of the COVID-19 declaration under Section 564(b)(1) of the Act, 21 U.S.C. section 360bbb-3(b)(1), unless the authorization is terminated or revoked.  Performed at [facility], [address], [place], [place] [address]   Culture, [place] Urine     Status: Abnormal   Collection Time: 09/28/22  5:37 PM   Specimen: OB Clean Catch; Urine  Result Value Ref Range Status    Specimen Description OB CLEAN CATCH  Final   Special Requests Normal  Final   Culture (A)  Final    <10,000 COLONIES/mL INSIGNIFICANT GROWTH NO GROUP B STREP (S.AGALACTIAE) ISOLATED Performed at [facility], [address], [place], [place] [address]    Report Status 09/29/2022 FINAL  Final  Culture, blood (Routine X 2) w Reflex to ID Panel     Status: None (Preliminary result)   Collection Time: 09/28/22  8:25 PM   Specimen: BLOOD  Result Value Ref Range Status   Specimen Description BLOOD RIGHT ANTECUBITAL  Final   Special Requests   Final    BOTTLES DRAWN AEROBIC AND ANAEROBIC Blood Culture adequate volume   Culture   Final    NO GROWTH 2 DAYS Performed  at [facility], [address] [place], [place] [address]    Report Status PENDING  Incomplete  Culture, blood (Routine X 2) w Reflex to ID Panel     Status: None (Preliminary result)   Collection Time: 09/28/22  8:27 PM   Specimen: BLOOD  Result Value Ref Range Status   Specimen Description BLOOD RIGHT ANTECUBITAL  Final   Special Requests   Final    BOTTLES DRAWN AEROBIC AND ANAEROBIC Blood Culture adequate volume   Culture   Final    NO GROWTH 2 DAYS Performed at [facility], [address], [place], [place] [address]    Report Status PENDING  Incomplete  C Difficile Quick Screen w PCR reflex     Status: None   Collection Time: 09/30/22  3:22 PM   Specimen: STOOL  Result Value Ref Range Status   C Diff antigen NEGATIVE NEGATIVE Final   C Diff toxin NEGATIVE NEGATIVE Final   C Diff interpretation No C. difficile detected.  Final    Comment: Performed at [facility], [address], [place], [place] [address]      Studies: No results found.  Scheduled Meds:  docusate sodium  100 mg Oral Daily   ferrous sulfate  325 mg Oral QODAY   potassium chloride  40 mEq Oral BID   prenatal multivitamin  1 tablet Oral Q1200    Continuous Infusions:  sodium chloride 125 mL/hr at 09/30/22 1134    promethazine (PHENERGAN) injection (IM or IVPB) 25 mg (09/30/22 1132)     LOS: 2 days     Briant Cedar, MD [facility]  If 7PM-7AM, please contact night-coverage [email or web address] 09/30/2022, 6:42 PM

## 2022-09-30 NOTE — Progress Notes (Signed)
FACULTY PRACTICE ANTEPARTUM COMPREHENSIVE PROGRESS NOTE  Melissa Little is a 37 y.o. [id number] at [redacted]w[redacted]d who is admitted for chronic FUO.  Estimated Date of Delivery: 12/31/22 Fetal presentation is unsure.  Length of Stay:  2 Days. Admitted 09/28/2022  Subjective: Intermittent chills. No abdominal pain or uterine cramps/ctxns. Notes N/V. + headache but improves with tylenol.   Patient reports good fetal movement.  She reports no uterine contractions, no bleeding and no loss of fluid per vagina.  Vitals:  Blood pressure (!) 126/51, pulse (!) 117, temperature (!) 101.5 F (38.6 C), temperature source Oral, resp. rate 18, height 5\' 2"  (1.575 m), weight 58.1 kg, last menstrual period 03/23/2022, SpO2 95%. Physical Examination: CONSTITUTIONAL: Well-developed, well-nourished female in no acute distress.  NEUROLOGIC: Alert and oriented to person, place, and time. No cranial nerve deficit noted. PSYCHIATRIC: Normal mood and affect. Normal behavior. Normal judgment and thought content. CARDIOVASCULAR: Normal heart rate noted, regular rhythm RESPIRATORY: Effort and breath sounds normal, no problems with respiration noted MUSCULOSKELETAL: Normal range of motion. No edema and no tenderness. 2+ distal pulses. ABDOMEN: Soft, nontender, nondistended, gravid.  Fetal monitoring: FHR: 155 bpm, Variability: moderate, Accelerations: Present 10x10, Decelerations: Absent  Uterine activity: None  Results for orders placed or performed during the hospital encounter of 09/28/22 (from the past 48 hour(s))  Urinalysis, Routine w reflex microscopic -Urine, Clean Catch     Status: Abnormal   Collection Time: 09/28/22  5:37 PM  Result Value Ref Range   Color, Urine YELLOW YELLOW   APPearance CLOUDY (A) CLEAR   Specific Gravity, Urine <1.005 (L) 1.005 - 1.030   pH 6.5 5.0 - 8.0   Glucose, UA NEGATIVE NEGATIVE mg/dL   Hgb urine dipstick LARGE (A) NEGATIVE   Bilirubin Urine SMALL (A) NEGATIVE   Ketones, ur 15 (A)  NEGATIVE mg/dL   Protein, ur 30 (A) NEGATIVE mg/dL   Nitrite NEGATIVE NEGATIVE   Leukocytes,Ua LARGE (A) NEGATIVE    Comment: Performed at [facility], [address] [place], [place] [address]  Resp panel by RT-PCR (RSV, Flu A&B, Covid) Anterior Nasal Swab     Status: None   Collection Time: 09/28/22  5:37 PM   Specimen: Anterior Nasal Swab  Result Value Ref Range   SARS Coronavirus 2 by RT PCR NEGATIVE NEGATIVE   Influenza A by PCR NEGATIVE NEGATIVE   Influenza B by PCR NEGATIVE NEGATIVE    Comment: (NOTE) The Xpert Xpress SARS-CoV-2/FLU/RSV plus assay is intended as an aid in the diagnosis of influenza from Nasopharyngeal swab specimens and should not be used as a sole basis for treatment. Nasal washings and aspirates are unacceptable for Xpert Xpress SARS-CoV-2/FLU/RSV testing.  Fact Sheet for Patients: BloggerCourse.com  Fact Sheet for Healthcare Providers: SeriousBroker.it  This test is not yet approved or cleared by the [place] FDA and has been authorized for detection and/or diagnosis of SARS-CoV-2 by FDA under an Emergency Use Authorization (EUA). This EUA will remain in effect (meaning this test can be used) for the duration of the COVID-19 declaration under Section 564(b)(1) of the Act, 21 U.S.C. section 360bbb-3(b)(1), unless the authorization is terminated or revoked.     Resp Syncytial Virus by PCR NEGATIVE NEGATIVE    Comment: (NOTE) Fact Sheet for Patients: BloggerCourse.com  Fact Sheet for Healthcare Providers: SeriousBroker.it  This test is not yet approved or cleared by the [place] FDA and has been authorized for detection and/or diagnosis of SARS-CoV-2 by FDA under an Emergency Use Authorization (EUA).  This EUA will remain in effect (meaning this test can be used) for the duration of the COVID-19 declaration under Section 564(b)(1) of  the Act, 21 U.S.C. section 360bbb-3(b)(1), unless the authorization is terminated or revoked.  Performed at [facility], [address], [place], [place] [address]   Urinalysis, Microscopic (reflex)     Status: Abnormal   Collection Time: 09/28/22  5:37 PM  Result Value Ref Range   RBC / HPF 6-10 0 - 5 RBC/hpf   WBC, UA 21-50 0 - 5 WBC/hpf   Bacteria, UA RARE (A) NONE SEEN   Squamous Epithelial / HPF 11-20 0 - 5 /HPF    Comment: MICROSCOPIC EXAM PERFORMED ON UNCONCENTRATED URINE   Non Squamous Epithelial PRESENT (A) NONE SEEN   Mucus PRESENT     Comment: Performed at [facility], [address], [place], [place] [address]  Culture, [place] Urine     Status: Abnormal   Collection Time: 09/28/22  5:37 PM   Specimen: OB Clean Catch; Urine  Result Value Ref Range   Specimen Description OB CLEAN CATCH    Special Requests Normal    Culture (A)     <10,000 COLONIES/mL INSIGNIFICANT GROWTH NO GROUP B STREP (S.AGALACTIAE) ISOLATED Performed at [facility], [address] [place], [place] [address]    Report Status 09/29/2022 FINAL   CBC with Differential/Platelet     Status: Abnormal   Collection Time: 09/28/22  7:29 PM  Result Value Ref Range   WBC 22.5 (H) 4.0 - 10.5 K/uL   RBC 3.71 (L) 3.87 - 5.11 MIL/uL   Hemoglobin 9.6 (L) 12.0 - 15.0 g/dL   HCT 52.8 (L) 41.3 - 24.4 %   MCV 77.6 (L) 80.0 - 100.0 fL   MCH 25.9 (L) 26.0 - 34.0 pg   MCHC 33.3 30.0 - 36.0 g/dL   RDW 01.0 (H) 27.2 - 53.6 %   Platelets 215 150 - 400 K/uL   nRBC 0.2 0.0 - 0.2 %   Neutrophils Relative % 90 %   Neutro Abs 20.3 (H) 1.7 - 7.7 K/uL   Lymphocytes Relative 2 %   Lymphs Abs 0.5 (L) 0.7 - 4.0 K/uL   Monocytes Relative 7 %   Monocytes Absolute 1.6 (H) 0.1 - 1.0 K/uL   Eosinophils Relative 1 %   Eosinophils Absolute 0.2 0.0 - 0.5 K/uL   Basophils Relative 0 %   Basophils Absolute 0.0 0.0 - 0.1 K/uL   nRBC 0 0 /100 WBC   Abs Immature Granulocytes 0.00 0.00 - 0.07 K/uL    Comment:  Performed at [facility], [address] [place], [place] [address]  Save smear     Status: None   Collection Time: 09/28/22  7:29 PM  Result Value Ref Range   Smear Review SMEAR STAINED AND AVAILABLE FOR REVIEW     Comment: Performed at [facility], [address] [place], [place] [address]  Malachi Carl vrs(ebv dna by pcr)     Status: None   Collection Time: 09/28/22  7:29 PM  Result Value Ref Range   EBV DNA QN by PCR Negative Negative IU/mL    Comment: (NOTE) No EBV DNA detected. The linear range of this assay is 35 - 100,000,000 IU/mL Performed At: [facility] [address] [place], [place] [id number] Jolene Schimke MD [id number]   Sedimentation rate     Status: Abnormal   Collection Time: 09/28/22  7:29 PM  Result Value Ref Range   Sed Rate 47 (H) 0 - 22 mm/hr    Comment: Performed at [facility], [address], [place], [place] [address]  C-reactive protein     Status: Abnormal   Collection Time: 09/28/22  7:29 PM  Result Value Ref Range   CRP 21.8 (H) <1.0 mg/dL    Comment: Performed at [facility], [address] [place], [place] [address]  ANA w/Reflex if Positive     Status: None   Collection Time: 09/28/22  7:29 PM  Result Value Ref Range   Anti Nuclear Antibody (ANA) Negative Negative    Comment: (NOTE) Performed At: [facility] [address] [place], [place] [id number] Jolene Schimke MD [id number]   Angiotensin converting enzyme     Status: Abnormal   Collection Time: 09/28/22  7:29 PM  Result Value Ref Range   Angiotensin-Converting Enzyme 98 (H) 14 - 82 U/L    Comment: (NOTE) Performed At: [facility] [address] [place], [place] [id number] Jolene Schimke MD UY:[id number]   Comprehensive metabolic panel     Status: Abnormal   Collection Time: 09/28/22  7:29 PM  Result Value Ref Range   Sodium 125 (L) 135 - 145 mmol/L   Potassium 2.3 (LL) 3.5 - 5.1 mmol/L    Comment: REPEATED TO  VERIFY CRITICAL RESULT CALLED TO, READ BACK BY AND VERIFIED WITH TWITTY,A RN 09/28/22 2133 AMIREHSANI F    Chloride 91 (L) 98 - 111 mmol/L   CO2 23 22 - 32 mmol/L   Glucose, Bld 79 70 - 99 mg/dL    Comment: Glucose reference range applies only to samples taken after fasting for at least 8 hours.   BUN 5 (L) 6 - 20 mg/dL   Creatinine, Ser 6.38 0.44 - 1.00 mg/dL   Calcium 7.8 (L) 8.9 - 10.3 mg/dL   Total Protein 6.1 (L) 6.5 - 8.1 g/dL   Albumin 2.1 (L) 3.5 - 5.0 g/dL   AST 40 15 - 41 U/L   ALT 11 0 - 44 U/L   Alkaline Phosphatase 144 (H) 38 - 126 U/L   Total Bilirubin 0.6 0.3 - 1.2 mg/dL   GFR, Estimated >75 >64 mL/min    Comment: (NOTE) Calculated using the CKD-EPI Creatinine Equation (2021)    Anion gap 11 5 - 15    Comment: Performed at [facility], [address] [place], [place] [address]  Lactate dehydrogenase     Status: Abnormal   Collection Time: 09/28/22  7:29 PM  Result Value Ref Range   LDH 617 (H) 98 - 192 U/L    Comment: Performed at [facility], [address] [place], [place] [address]  Protime-INR     Status: Abnormal   Collection Time: 09/28/22  7:29 PM  Result Value Ref Range   Prothrombin Time 17.5 (H) 11.4 - 15.2 seconds   INR 1.4 (H) 0.8 - 1.2    Comment: (NOTE) INR goal varies based on device and disease states. Performed at [facility], [address] [place], [place] [address]   APTT     Status: None   Collection Time: 09/28/22  7:29 PM  Result Value Ref Range   aPTT 36 24 - 36 seconds    Comment: Performed at [facility], [address] [place], [place] [address]  Magnesium     Status: Abnormal   Collection Time: 09/28/22  7:29 PM  Result  Value Ref Range   Magnesium 1.5 (L) 1.7 - 2.4 mg/dL    Comment: Performed at [facility], [address], [place], [place] [address]  TSH     Status: None   Collection Time: 09/28/22  7:29 PM  Result Value Ref Range   TSH 2.138 0.350 - 4.500 uIU/mL    Comment: Performed  by a 3rd Generation assay with a functional sensitivity of <=0.01 uIU/mL. Performed at [facility], [address] [place], [place] [address]   Culture, blood (Routine X 2) w Reflex to ID Panel     Status: None (Preliminary result)   Collection Time: 09/28/22  8:25 PM   Specimen: BLOOD  Result Value Ref Range   Specimen Description BLOOD RIGHT ANTECUBITAL    Special Requests      BOTTLES DRAWN AEROBIC AND ANAEROBIC Blood Culture adequate volume   Culture      NO GROWTH 2 DAYS Performed at [facility], [address], [place], [place] [address]    Report Status PENDING   Culture, blood (Routine X 2) w Reflex to ID Panel     Status: None (Preliminary result)   Collection Time: 09/28/22  8:27 PM   Specimen: BLOOD  Result Value Ref Range   Specimen Description BLOOD RIGHT ANTECUBITAL    Special Requests      BOTTLES DRAWN AEROBIC AND ANAEROBIC Blood Culture adequate volume   Culture      NO GROWTH 2 DAYS Performed at [facility], [address], [place], [place] [address]    Report Status PENDING   Comprehensive metabolic panel     Status: Abnormal   Collection Time: 09/29/22  4:08 AM  Result Value Ref Range   Sodium 126 (L) 135 - 145 mmol/L   Potassium 3.0 (L) 3.5 - 5.1 mmol/L   Chloride 96 (L) 98 - 111 mmol/L   CO2 22 22 - 32 mmol/L   Glucose, Bld 85 70 - 99 mg/dL    Comment: Glucose reference range applies only to samples taken after fasting for at least 8 hours.   BUN <5 (L) 6 - 20 mg/dL   Creatinine, Ser 5.64 0.44 - 1.00 mg/dL   Calcium 7.4 (L) 8.9 - 10.3 mg/dL   Total Protein 5.4 (L) 6.5 - 8.1 g/dL   Albumin 1.8 (L) 3.5 - 5.0 g/dL   AST 35 15 - 41 U/L   ALT 10 0 - 44 U/L   Alkaline Phosphatase 145 (H) 38 - 126 U/L   Total Bilirubin 0.4 0.3 - 1.2 mg/dL   GFR, Estimated >33 >29 mL/min    Comment: (NOTE) Calculated using the CKD-EPI Creatinine Equation (2021)    Anion gap 8 5 - 15    Comment: Performed at [facility], [address]  [place], [place] [address]  CBC     Status: Abnormal   Collection Time: 09/29/22  4:08 AM  Result Value Ref Range   WBC 32.1 (H) 4.0 - 10.5 K/uL   RBC 3.64 (L) 3.87 - 5.11 MIL/uL   Hemoglobin 9.4 (L) 12.0 - 15.0 g/dL   HCT 16.6 (L) 06.3 - 01.6 %   MCV 78.0 (L) 80.0 - 100.0 fL   MCH 25.8 (L) 26.0 - 34.0 pg   MCHC 33.1 30.0 - 36.0 g/dL   RDW 01.0 (H) 93.2 - 35.5 %   Platelets 195 150 - 400 K/uL   nRBC 0.2 0.0 - 0.2 %  Comment: Performed at [facility], [address] [place], [place] [address]  Cortisol-am, blood     Status: Abnormal   Collection Time: 09/29/22  4:08 AM  Result Value Ref Range   Cortisol - AM 30.6 (H) 6.7 - 22.6 ug/dL    Comment: Performed at [facility], [address], [place], [place] [address]  Lactic acid, plasma     Status: Abnormal   Collection Time: 09/29/22  4:08 AM  Result Value Ref Range   Lactic Acid, Venous 2.0 (HH) 0.5 - 1.9 mmol/L    Comment: CRITICAL RESULT CALLED TO, READ BACK BY AND VERIFIED WITH S.CROTTS RN [phone] 09/29/2022 BY G.GANADEN Performed at [facility], [address], [place], [place] [address]   Procalcitonin     Status: None   Collection Time: 09/29/22  4:08 AM  Result Value Ref Range   Procalcitonin 4.83 ng/mL    Comment:        Interpretation: PCT > 2 ng/mL: Systemic infection (sepsis) is likely, unless other causes are known. (NOTE)       Sepsis PCT Algorithm           Lower Respiratory Tract                                      Infection PCT Algorithm    ----------------------------     ----------------------------         PCT < 0.25 ng/mL                PCT < 0.10 ng/mL          Strongly encourage             Strongly discourage   discontinuation of antibiotics    initiation of antibiotics    ----------------------------     -----------------------------       PCT 0.25 - 0.50 ng/mL            PCT 0.10 - 0.25 ng/mL               OR       >80% decrease in PCT            Discourage initiation of                                             antibiotics      Encourage discontinuation           of antibiotics    ----------------------------     -----------------------------         PCT >= 0.50 ng/mL              PCT 0.26 - 0.50 ng/mL               AND       <80% decrease in PCT              Encourage initiation of                                             antibiotics       Encourage continuation  of antibiotics    ----------------------------     -----------------------------        PCT >= 0.50 ng/mL                  PCT > 0.50 ng/mL               AND         increase in PCT                  Strongly encourage                                      initiation of antibiotics    Strongly encourage escalation           of antibiotics                                     -----------------------------                                           PCT <= 0.25 ng/mL                                                 OR                                        > 80% decrease in PCT                                      Discontinue / Do not initiate                                             antibiotics  Performed at [facility], [address], [place], [place] [address]   Lactic acid, plasma     Status: None   Collection Time: 09/29/22  7:18 AM  Result Value Ref Range   Lactic Acid, Venous 1.3 0.5 - 1.9 mmol/L    Comment: Performed at [facility], [address] [place], [place] [address]  Magnesium     Status: None   Collection Time: 09/29/22  2:53 PM  Result Value Ref Range   Magnesium 1.9 1.7 - 2.4 mg/dL    Comment: Performed at [facility], [address], [place], [place] [address]  Sjogrens syndrome-A extractable nuclear antibody     Status: None   Collection Time: 09/29/22  2:53 PM  Result Value Ref Range   SSA (Ro) (ENA) Antibody, IgG <0.2 0.0 - 0.9 AI    Comment: (NOTE) Performed At: [facility] [address] [place], [place] [id number] Jolene Schimke MD UU:[id number]   Sjogrens syndrome-B extractable nuclear antibody     Status: None   Collection Time: 09/29/22  2:53 PM  Result Value Ref Range   SSB (La) (ENA) Antibody, IgG <0.2 0.0 - 0.9 AI    Comment: (NOTE) Performed At: [facility] [address] [place], [place] [id number] Jolene Schimke MD WU:[id number]   Hepatitis panel, acute     Status: None   Collection Time: 09/29/22  2:53 PM  Result Value Ref Range   Hepatitis B Surface Ag NON REACTIVE NON REACTIVE   HCV Ab NON REACTIVE NON REACTIVE    Comment: (NOTE) Nonreactive HCV antibody screen is consistent with no HCV infections,  unless recent infection is suspected or other evidence exists to indicate HCV infection.     Hep A IgM NON REACTIVE NON REACTIVE   Hep B C IgM NON REACTIVE NON REACTIVE    Comment: Performed at [facility], [address], [place], [place] [address]  HIV Antibody (routine testing w rflx)     Status: None   Collection Time: 09/29/22  2:53 PM  Result Value Ref Range   HIV Screen 4th Generation wRfx Non Reactive Non Reactive    Comment: Performed at [facility], [address] [place], [place] [address]  RPR     Status: None   Collection Time: 09/29/22  2:53 PM  Result Value Ref Range   RPR Ser Ql NON REACTIVE NON REACTIVE    Comment: Performed at [facility], [address], [place], [place] [address]  CBC with Differential/Platelet     Status: Abnormal   Collection Time: 09/30/22  5:30 AM  Result Value Ref Range   WBC 15.4 (H) 4.0 - 10.5 K/uL   RBC 3.24 (L) 3.87 - 5.11 MIL/uL   Hemoglobin 8.3 (L) 12.0 - 15.0 g/dL   HCT 62.9 (L) 52.8 - 41.3 %   MCV 80.2 80.0 - 100.0 fL   MCH 25.6 (L) 26.0 - 34.0 pg   MCHC 31.9 30.0 - 36.0 g/dL   RDW 24.4 (H) 01.0 - 27.2 %   Platelets 191 150 - 400 K/uL   nRBC 0.1 0.0 - 0.2 %   Neutrophils Relative % 72 %   Neutro Abs 11.1 (H) 1.7 - 7.7 K/uL   Lymphocytes Relative 5 %   Lymphs Abs 0.8 0.7 - 4.0 K/uL   Monocytes  Relative 4 %   Monocytes Absolute 0.6 0.1 - 1.0 K/uL   Eosinophils Relative 1 %   Eosinophils Absolute 0.1 0.0 - 0.5 K/uL   Basophils Relative 1 %   Basophils Absolute 0.1 0.0 - 0.1 K/uL   WBC Morphology Mild Left Shift (1-5% metas, occ myelo)    RBC Morphology MORPHOLOGY UNREMARKABLE    Smear Review PLATELETS APPEAR ADEQUATE    Immature Granulocytes 17 %   Abs Immature Granulocytes 2.61 (H) 0.00 - 0.07 K/uL    Comment: Performed at [facility], [address] [place], [place] [address]  Basic metabolic panel     Status: Abnormal   Collection Time: 09/30/22  5:30 AM  Result Value Ref Range   Sodium 136 135 - 145 mmol/L    Comment: DELTA CHECK NOTED   Potassium 3.1 (L) 3.5 - 5.1 mmol/L   Chloride 104 98 - 111 mmol/L   CO2 18 (L) 22 - 32 mmol/L   Glucose, Bld 93 70 - 99 mg/dL    Comment: Glucose reference range applies only to samples taken after fasting for at least 8 hours.   BUN <5 (L) 6 - 20 mg/dL   Creatinine, Ser 4.03  0.44 - 1.00 mg/dL   Calcium 7.6 (L) 8.9 - 10.3 mg/dL   GFR, Estimated >33 >29 mL/min    Comment: (NOTE) Calculated using the CKD-EPI Creatinine Equation (2021)    Anion gap 14 5 - 15    Comment: Performed at [facility], [address] [place], [place] [address]    ECHOCARDIOGRAM COMPLETE  Result Date: 09/29/2022    ECHOCARDIOGRAM REPORT   Patient Name:   Melissa Sandiford Date of Exam: 09/29/2022 Medical Rec #:  [id number]    Height:       62.0 in Accession #:    [id number]   Weight:       128.0 lb Date of Birth:  [birth year]   BSA:          1.581 m Patient Age:    36 years     BP:           96/53 mmHg Patient Gender: F            HR:           87 bpm. Exam Location:  Inpatient Procedure: 2D Echo, Cardiac Doppler and Color Doppler Indications:    Fever  History:        Patient has no prior history of Echocardiogram examinations.                 Signs/Symptoms:pregnancy and Fever.  Sonographer:    Delcie Roch RDCS Referring Phys: [id number] CHARLIE  PICKENS IMPRESSIONS  1. Left ventricular ejection fraction, by estimation, is 60 to 65%. The left ventricle has normal function. The left ventricle has no regional wall motion abnormalities. Left ventricular diastolic parameters were normal.  2. Right ventricular systolic function is normal. The right ventricular size is normal. Tricuspid regurgitation signal is inadequate for assessing PA pressure.  3. The mitral valve is normal in structure. No evidence of mitral valve regurgitation.  4. The aortic valve is grossly normal. Aortic valve regurgitation is not visualized.  5. The inferior vena cava is normal in size with greater than 50% respiratory variability, suggesting right atrial pressure of 3 mmHg. FINDINGS  Left Ventricle: Left ventricular ejection fraction, by estimation, is 60 to 65%. The left ventricle has normal function. The left ventricle has no regional wall motion abnormalities. The left ventricular internal cavity size was normal in size. There is  no left ventricular hypertrophy. Left ventricular diastolic parameters were normal. Right Ventricle: The right ventricular size is normal. Right ventricular systolic function is normal. Tricuspid regurgitation signal is inadequate for assessing PA pressure. Left Atrium: Left atrial size was normal in size. Right Atrium: Right atrial size was normal in size. Pericardium: Trivial pericardial effusion is present. Mitral Valve: The mitral valve is normal in structure. No evidence of mitral valve regurgitation. Tricuspid Valve: The tricuspid valve is normal in structure. Tricuspid valve regurgitation is trivial. Aortic Valve: The aortic valve is grossly normal. Aortic valve regurgitation is not visualized. Pulmonic Valve: The pulmonic valve was grossly normal. Pulmonic valve regurgitation is not visualized. Aorta: The aortic root and ascending aorta are structurally normal, with no evidence of dilitation. Venous: The inferior vena cava is normal in size with  greater than 50% respiratory variability, suggesting right atrial pressure of 3 mmHg. IAS/Shunts: No atrial level shunt detected by color flow Doppler.  LEFT VENTRICLE PLAX 2D LVIDd:         4.30 cm   Diastology LVIDs:         2.70 cm  LV e' medial:    9.25 cm/s LV PW:         0.90 cm   LV E/e' medial:  9.9 LV IVS:        0.90 cm   LV e' lateral:   14.00 cm/s LVOT diam:     1.90 cm   LV E/e' lateral: 6.5 LV SV:         66 LV SV Index:   42 LVOT Area:     2.84 cm  RIGHT VENTRICLE             IVC RV Basal diam:  2.40 cm     IVC diam: 1.50 cm RV S prime:     13.70 cm/s TAPSE (M-mode): 2.5 cm LEFT ATRIUM             Index        RIGHT ATRIUM           Index LA diam:        3.30 cm 2.09 cm/m   RA Area:     14.10 cm LA Vol (A2C):   41.9 ml 26.50 ml/m  RA Volume:   32.80 ml  20.74 ml/m LA Vol (A4C):   36.0 ml 22.76 ml/m LA Biplane Vol: 41.3 ml 26.12 ml/m  AORTIC VALVE LVOT Vmax:   120.00 cm/s LVOT Vmean:  77.100 cm/s LVOT VTI:    0.232 m  AORTA Ao Root diam: 2.60 cm Ao Asc diam:  2.30 cm MITRAL VALVE MV Area (PHT): 4.31 cm    SHUNTS MV Decel Time: 176 msec    Systemic VTI:  0.23 m MV E velocity: 91.30 cm/s  Systemic Diam: 1.90 cm MV A velocity: 55.50 cm/s MV E/A ratio:  1.65 [place] signed by Carolan Clines Signature Date/Time: 09/29/2022/1:05:54 PM    Final    US RENAL  Result Date: 09/29/2022 CLINICAL DATA:  37 year old female is pregnant in the 2nd trimester. Pyelonephritis. EXAM: RENAL / URINARY TRACT ULTRASOUND COMPLETE COMPARISON:  Renal ultrasound 08/12/2022. FINDINGS: Right Kidney: Renal measurements: 12.1 x 5.0 x 6.3 cm = volume: 197 mL. Echogenicity within normal limits, maintained corticomedullary differentiation. No mass or hydronephrosis visualized. Left Kidney: Renal measurements: 10.6 x 6.5 x 5.2 cm = volume: 189 mL. Echogenicity within normal limits. Normal corticomedullary differentiation. No mass or hydronephrosis visualized. Bladder: Decompressed and poorly visible. Other:  More symmetric renal vascularity on brief color Doppler images today (images 5 and 28). Partially visible gravid uterus. IMPRESSION: 1. Normal ultrasound appearance of both kidneys. Decompressed bladder. 2. Partially visible gravid uterus. Electronically Signed   By: Odessa Fleming M.D.   On: 09/29/2022 06:36   DG Chest 2 View  Result Date: 09/28/2022 CLINICAL DATA:  Sepsis EXAM: CHEST - 2 VIEW COMPARISON:  Chest x-ray with ribs 11/19/2005 FINDINGS: The heart size and mediastinal contours are within normal limits. Both lungs are clear. The visualized skeletal structures are unremarkable. IMPRESSION: No active cardiopulmonary disease. Electronically Signed   By: Darliss Cheney M.D.   On: 09/28/2022 23:26    Current scheduled medications  docusate sodium  100 mg Oral Daily   ferrous sulfate  325 mg Oral QODAY   potassium chloride  40 mEq Oral BID   prenatal multivitamin  1 tablet Oral Q1200    I have reviewed the patient's current medications.  ASSESSMENT: Principal Problem:   Pyelonephritis Active Problems:   [redacted] weeks gestation of pregnancy   Pyelonephritis affecting pregnancy in second trimester   Fever, unknown  origin   Sepsis [facility])   PLAN: FUO - ID consulted and appreciate their input given still FUO. Dr. Renold Don is reviewing patient chart and we will do additional imaging with MRI to r/o Lymphoma. Discussed with radiology to ensure proper imaging.  - Bcx NG thus far. Ucx negative - Cefepime, Vanc, Flagyl switched to Ceftriaxone. Will d/c per ID. Continue tylenol for fever relief.  - CXR negative, Renal US negative  FWB - Continue NST daily  3. Routine PNC - Consider GTT once nausea improved - Tdap once well   Continue routine antenatal care.   Milas Hock, MD, FACOG Obstetrician & Gynecologist, [facility] for [facility], [facility]

## 2022-09-30 NOTE — Plan of Care (Signed)
Id brief note    37 yo female african american no recent travel 2nd trimester pregnancy here for fuo   Patient had positive mono test a week prior to admission; this admission ebv pcr negative  Came in due to abd  pain, n/v  Bcx/ucx negative  Abx given and continues to have fever   Ldh is high Blood smear with mild left shift Leukocyotosis Alkphos elevated mildly but other lft including bilirubin normal No aki No thrombocytopenia   Other id w/u including hiv, hepatitis, covid/flu, rpr negative  Spotted fever serology and quantiferon gold in process  Tte negative    A/p Fuo Positive mono test  Elevated alkphos and ldh  Leukocytosis    -given negative bcx/ucx can stop ceftriaxone -access for cat scratch fever -full ebv panel -given high ldh and false positive mono test would check and r/o lymphoma -f/u spotted fever group but doubt that being a cause -discussed with primary team   -formal consult in am

## 2022-10-01 ENCOUNTER — Other Ambulatory Visit (HOSPITAL_COMMUNITY): Payer: Medicaid Other

## 2022-10-01 ENCOUNTER — Inpatient Hospital Stay (HOSPITAL_COMMUNITY): Payer: Medicaid Other

## 2022-10-01 ENCOUNTER — Encounter (HOSPITAL_COMMUNITY): Payer: Self-pay | Admitting: *Deleted

## 2022-10-01 DIAGNOSIS — B2790 Infectious mononucleosis, unspecified without complication: Secondary | ICD-10-CM

## 2022-10-01 DIAGNOSIS — D72829 Elevated white blood cell count, unspecified: Secondary | ICD-10-CM | POA: Diagnosis not present

## 2022-10-01 DIAGNOSIS — N12 Tubulo-interstitial nephritis, not specified as acute or chronic: Secondary | ICD-10-CM | POA: Diagnosis not present

## 2022-10-01 DIAGNOSIS — Z3A27 27 weeks gestation of pregnancy: Secondary | ICD-10-CM | POA: Diagnosis not present

## 2022-10-01 DIAGNOSIS — R509 Fever, unspecified: Secondary | ICD-10-CM | POA: Diagnosis not present

## 2022-10-01 HISTORY — PX: IR US LIVER BIOPSY: IMG936

## 2022-10-01 LAB — BASIC METABOLIC PANEL
Anion gap: 8 (ref 5–15)
BUN: <5 mg/dL — ABNORMAL LOW (ref 6–20)
CO2: 20 mmol/L — ABNORMAL LOW (ref 22–32)
Calcium: 7.6 mg/dL — ABNORMAL LOW (ref 8.9–10.3)
Chloride: 105 mmol/L (ref 98–111)
Creatinine, Ser: 0.53 mg/dL (ref 0.44–1.00)
GFR, Estimated: >60 mL/min (ref >60)
Glucose, Bld: 78 mg/dL (ref 70–99)
Potassium: 3 mmol/L — ABNORMAL LOW (ref 3.5–5.1)
Sodium: 133 mmol/L — ABNORMAL LOW (ref 135–145)

## 2022-10-01 LAB — CBC WITH DIFFERENTIAL/PLATELET
Abs Immature Granulocytes: 0 10*3/uL (ref 0.00–0.07)
Basophils Absolute: 0 10*3/uL (ref 0.0–0.1)
Basophils Relative: 0 %
Eosinophils Absolute: 0 10*3/uL (ref 0.0–0.5)
Eosinophils Relative: 0 %
HCT: 26.7 % — ABNORMAL LOW (ref 36.0–46.0)
Hemoglobin: 9.1 g/dL — ABNORMAL LOW (ref 12.0–15.0)
Lymphocytes Relative: 2 %
Lymphs Abs: 0.4 10*3/uL — ABNORMAL LOW (ref 0.7–4.0)
MCH: 27.3 pg (ref 26.0–34.0)
MCHC: 34.1 g/dL (ref 30.0–36.0)
MCV: 80.2 fL (ref 80.0–100.0)
Monocytes Absolute: 0.7 10*3/uL (ref 0.1–1.0)
Monocytes Relative: 4 %
Neutro Abs: 17.4 10*3/uL — ABNORMAL HIGH (ref 1.7–7.7)
Neutrophils Relative %: 94 %
Platelets: 178 10*3/uL (ref 150–400)
RBC: 3.33 MIL/uL — ABNORMAL LOW (ref 3.87–5.11)
RDW: 20.7 % — ABNORMAL HIGH (ref 11.5–15.5)
WBC: 18.5 10*3/uL — ABNORMAL HIGH (ref 4.0–10.5)
nRBC: 0 /100{WBCs}
nRBC: 0.1 % (ref 0.0–0.2)

## 2022-10-01 LAB — LYME DISEASE, WESTERN BLOT
IgG P18 Ab.: ABSENT
IgG P23 Ab.: ABSENT
IgG P28 Ab.: ABSENT
IgG P30 Ab.: ABSENT
IgG P39 Ab.: ABSENT
IgG P41 Ab.: ABSENT
IgG P45 Ab.: ABSENT
IgG P58 Ab.: ABSENT
IgG P66 Ab.: ABSENT
IgG P93 Ab.: ABSENT
IgM P23 Ab.: ABSENT
IgM P41 Ab.: ABSENT
Lyme IgG Wb: NEGATIVE
Lyme IgM Wb: NEGATIVE

## 2022-10-01 LAB — C3 COMPLEMENT: C3 Complement: 174 mg/dL — ABNORMAL HIGH (ref 82–167)

## 2022-10-01 LAB — RHEUMATOID FACTOR: Rheumatoid fact SerPl-aCnc: 13.9 [IU]/mL (ref <14.0)

## 2022-10-01 LAB — C4 COMPLEMENT: Complement C4, Body Fluid: 37 mg/dL (ref 12–38)

## 2022-10-01 LAB — HAPTOGLOBIN: Haptoglobin: 199 mg/dL (ref 33–278)

## 2022-10-01 MED ORDER — LIDOCAINE HCL 1 % IJ SOLN
INTRAMUSCULAR | Status: AC
  Filled 2022-10-01: qty 20

## 2022-10-01 MED ORDER — POTASSIUM CHLORIDE 10 MEQ/100ML IV SOLN
10.0000 meq | INTRAVENOUS | Status: AC
  Administered 2022-10-01 (×4): 10 meq via INTRAVENOUS
  Filled 2022-10-01 (×4): qty 100

## 2022-10-01 NOTE — Progress Notes (Signed)
FACULTY PRACTICE ANTEPARTUM COMPREHENSIVE PROGRESS NOTE  Melissa Little is a 37 y.o. [id number] at [redacted]w[redacted]d who is admitted for chronic FUO.  Estimated Date of Delivery: 12/31/22 Fetal presentation is unsure.  Length of Stay:  3 Days. Admitted 09/28/2022  Subjective: Intermittent chills. No abdominal pain or uterine cramps/ctxns. Notes N/V. No HA currently. Sometimes low O2 sats when not on O2. Does [place] intermittently.   Patient reports good fetal movement.  She reports no uterine contractions, no bleeding and no loss of fluid per vagina.  Vitals:  Blood pressure 125/62, pulse (!) 115, temperature 99.8 F (37.7 C), temperature source Oral, resp. rate 20, height 5\' 2"  (1.575 m), weight 58.1 kg, last menstrual period 03/23/2022, SpO2 97%. Physical Examination: CONSTITUTIONAL: Well-developed, well-nourished female in no acute distress.  NEUROLOGIC: Alert and oriented to person, place, and time. No cranial nerve deficit noted. PSYCHIATRIC: Normal mood and affect. Normal behavior. Normal judgment and thought content. CARDIOVASCULAR: Normal heart rate noted, regular rhythm RESPIRATORY: Effort and breath sounds normal, no problems with respiration noted MUSCULOSKELETAL: Normal range of motion. No edema and no tenderness. 2+ distal pulses. ABDOMEN: Soft, nontender, nondistended, gravid.  NST not yet done today.   Results for orders placed or performed during the hospital encounter of 09/28/22 (from the past 48 hour(s))  Magnesium     Status: None   Collection Time: 09/29/22  2:53 PM  Result Value Ref Range   Magnesium 1.9 1.7 - 2.4 mg/dL    Comment: Performed at [facility], [address] [place], [place] [address]  Sjogrens syndrome-A extractable nuclear antibody     Status: None   Collection Time: 09/29/22  2:53 PM  Result Value Ref Range   SSA (Ro) (ENA) Antibody, IgG <0.2 0.0 - 0.9 AI    Comment: (NOTE) Performed At: [facility] [address] [place], [place]  [id number] Jolene Schimke MD ZD:[id number]   Sjogrens syndrome-B extractable nuclear antibody     Status: None   Collection Time: 09/29/22  2:53 PM  Result Value Ref Range   SSB (La) (ENA) Antibody, IgG <0.2 0.0 - 0.9 AI    Comment: (NOTE) Performed At: [facility] [address] [place], [place] [id number] Jolene Schimke MD AY:[id number]   Hepatitis panel, acute     Status: None   Collection Time: 09/29/22  2:53 PM  Result Value Ref Range   Hepatitis B Surface Ag NON REACTIVE NON REACTIVE   HCV Ab NON REACTIVE NON REACTIVE    Comment: (NOTE) Nonreactive HCV antibody screen is consistent with no HCV infections,  unless recent infection is suspected or other evidence exists to indicate HCV infection.     Hep A IgM NON REACTIVE NON REACTIVE   Hep B C IgM NON REACTIVE NON REACTIVE    Comment: Performed at [facility], [address] [place], [place] [address]  HIV Antibody (routine testing w rflx)     Status: None   Collection Time: 09/29/22  2:53 PM  Result Value Ref Range   HIV Screen 4th Generation wRfx Non Reactive Non Reactive    Comment: Performed at [facility], [address] [place], [place] [address]  RPR     Status: None   Collection Time: 09/29/22  2:53 PM  Result Value Ref Range   RPR Ser Ql NON REACTIVE NON REACTIVE    Comment: Performed at [facility], [address], [place], [place] [address]  CBC with Differential/Platelet  Status: Abnormal   Collection Time: 09/30/22  5:30 AM  Result Value Ref Range   WBC 15.4 (H) 4.0 - 10.5 K/uL   RBC 3.24 (L) 3.87 - 5.11 MIL/uL   Hemoglobin 8.3 (L) 12.0 - 15.0 g/dL   HCT 33.2 (L) 95.1 - 88.4 %   MCV 80.2 80.0 - 100.0 fL   MCH 25.6 (L) 26.0 - 34.0 pg   MCHC 31.9 30.0 - 36.0 g/dL   RDW 16.6 (H) 06.3 - 01.6 %   Platelets 191 150 - 400 K/uL   nRBC 0.1 0.0 - 0.2 %   Neutrophils Relative % 72 %   Neutro Abs 11.1 (H) 1.7 - 7.7 K/uL   Lymphocytes Relative 5 %   Lymphs Abs 0.8 0.7 - 4.0  K/uL   Monocytes Relative 4 %   Monocytes Absolute 0.6 0.1 - 1.0 K/uL   Eosinophils Relative 1 %   Eosinophils Absolute 0.1 0.0 - 0.5 K/uL   Basophils Relative 1 %   Basophils Absolute 0.1 0.0 - 0.1 K/uL   WBC Morphology Mild Left Shift (1-5% metas, occ myelo)    RBC Morphology MORPHOLOGY UNREMARKABLE    Smear Review PLATELETS APPEAR ADEQUATE    Immature Granulocytes 17 %   Abs Immature Granulocytes 2.61 (H) 0.00 - 0.07 K/uL    Comment: Performed at [facility], [address], [place], [place] [address]  Basic metabolic panel     Status: Abnormal   Collection Time: 09/30/22  5:30 AM  Result Value Ref Range   Sodium 136 135 - 145 mmol/L    Comment: DELTA CHECK NOTED   Potassium 3.1 (L) 3.5 - 5.1 mmol/L   Chloride 104 98 - 111 mmol/L   CO2 18 (L) 22 - 32 mmol/L   Glucose, Bld 93 70 - 99 mg/dL    Comment: Glucose reference range applies only to samples taken after fasting for at least 8 hours.   BUN <5 (L) 6 - 20 mg/dL   Creatinine, Ser 2.35 0.44 - 1.00 mg/dL   Calcium 7.6 (L) 8.9 - 10.3 mg/dL   GFR, Estimated >57 >32 mL/min    Comment: (NOTE) Calculated using the CKD-EPI Creatinine Equation (2021)    Anion gap 14 5 - 15    Comment: Performed at [facility], [address] [place], [place] [address]  C Difficile Quick Screen w PCR reflex     Status: None   Collection Time: 09/30/22  3:22 PM   Specimen: STOOL  Result Value Ref Range   C Diff antigen NEGATIVE NEGATIVE   C Diff toxin NEGATIVE NEGATIVE   C Diff interpretation No C. difficile detected.     Comment: Performed at [facility], [address], [place], [place] [address]  CBC with Differential/Platelet     Status: Abnormal   Collection Time: 10/01/22  5:12 AM  Result Value Ref Range   WBC 18.5 (H) 4.0 - 10.5 K/uL   RBC 3.33 (L) 3.87 - 5.11 MIL/uL   Hemoglobin 9.1 (L) 12.0 - 15.0 g/dL   HCT 37.6 (L) 28.3 - 15.1 %   MCV 80.2 80.0 - 100.0 fL   MCH 27.3 26.0 - 34.0 pg   MCHC 34.1 30.0 - 36.0  g/dL   RDW 76.1 (H) 60.7 - 37.1 %   Platelets 178 150 - 400 K/uL   nRBC 0.1 0.0 - 0.2 %   Neutrophils Relative % 94 %   Neutro Abs 17.4 (H) 1.7 -  7.7 K/uL   Lymphocytes Relative 2 %   Lymphs Abs 0.4 (L) 0.7 - 4.0 K/uL   Monocytes Relative 4 %   Monocytes Absolute 0.7 0.1 - 1.0 K/uL   Eosinophils Relative 0 %   Eosinophils Absolute 0.0 0.0 - 0.5 K/uL   Basophils Relative 0 %   Basophils Absolute 0.0 0.0 - 0.1 K/uL   nRBC 0 0 /100 WBC   Abs Immature Granulocytes 0.00 0.00 - 0.07 K/uL    Comment: Performed at [facility], [address], [place], [place] [address]  Basic metabolic panel     Status: Abnormal   Collection Time: 10/01/22  5:12 AM  Result Value Ref Range   Sodium 133 (L) 135 - 145 mmol/L   Potassium 3.0 (L) 3.5 - 5.1 mmol/L   Chloride 105 98 - 111 mmol/L   CO2 20 (L) 22 - 32 mmol/L   Glucose, Bld 78 70 - 99 mg/dL    Comment: Glucose reference range applies only to samples taken after fasting for at least 8 hours.   BUN <5 (L) 6 - 20 mg/dL   Creatinine, Ser 6.04 0.44 - 1.00 mg/dL   Calcium 7.6 (L) 8.9 - 10.3 mg/dL   GFR, Estimated >54 >09 mL/min    Comment: (NOTE) Calculated using the CKD-EPI Creatinine Equation (2021)    Anion gap 8 5 - 15    Comment: Performed at [facility], [address], [place], [place] [address]    MR CHEST WO CONTRAST  Result Date: 10/01/2022 CLINICAL DATA:  Fever of unknown origin.  Evaluate for lymphoma. EXAM: MR CHEST WITHOUT CONTRAST TECHNIQUE: Multiplanar, multisequence MR imaging of the chest was performed without administration of intravenous contrast. COMPARISON:  None Available. FINDINGS: Cardiovascular: Heart size normal.  No pericardial effusion. Mediastinum/Nodes: No evidence for mediastinal lymphadenopathy. Possible small lymph nodes in the right hilum. No left hilar lymphadenopathy. The esophagus has normal imaging features. Increased number of upper normal sized to mildly enlarged lymph nodes in the subpectoral and  axillary regions bilaterally. Index left axillary node measures 12 mm short axis on image 36/15. Index right axillary node measures 14 mm short axis on 28/15. Lungs/Pleura: No gross pulmonary mass lesion. Tiny pulmonary nodules can be occult on MR imaging. No substantial consolidative airspace disease. Tiny bilateral pleural effusions evident. Musculoskeletal: No suspicious marrow signal abnormality. IMPRESSION: 1. Increased number of upper normal sized to mildly enlarged lymph nodes in the subpectoral and axillary regions bilaterally. Imaging features are nonspecific but can be seen in the setting of reactive, inflammatory or lymphoproliferative disease. These lymph nodes should be amenable to clinical inspection. 2. Tiny bilateral pleural effusions. Electronically Signed   By: Kennith Center M.D.   On: 10/01/2022 08:46   MR ABDOMEN WO CONTRAST  Result Date: 09/30/2022 CLINICAL DATA:  z EXAM: MRI ABDOMEN AND PELVIS WITHOUT CONTRAST TECHNIQUE: Multiplanar multisequence MR imaging of the abdomen and pelvis was performed. No intravenous contrast was administered. COMPARISON:  None Available. FINDINGS: COMBINED FINDINGS FOR BOTH MR ABDOMEN AND PELVIS Lower chest: Small bilateral pleural effusions. Please see separately dictated MR chest report for detailed findings. Hepatobiliary: Diffuse T2 hypointensity of the hepatic parenchyma with signal loss on inphase images. No bile duct dilation. Normal gallbladder. Pancreas: No mass, inflammatory changes, or other parenchymal abnormality identified. Spleen:  Within normal limits in size and appearance. Adrenals/Urinary Tract: 1.3 cm left adrenal nodule demonstrates signal dropout on out of phase images in keeping with adenoma. No right adrenal  nodule. No suspicious renal masses identified. No evidence of hydronephrosis. Stomach/Bowel: Visualized portions within the abdomen are unremarkable. Appendix is not discretely seen. Vascular/Lymphatic: Right external iliac lymph  node measures 1.4 cm (33:30) and left external iliac lymph node measures 1.0 cm (33:30). No abdominal aortic aneurysm demonstrated. Reproductive: Gravid uterus containing a single active fetus in cephalic presentation. This examination is not dedicated for evaluation of the fetus. Placenta is fundal and posterior. Cervix is closed. Other:  Trace pelvic free fluid. Musculoskeletal: No suspicious bone lesions identified. Diffuse body wall edema. IMPRESSION: 1. Gravid uterus containing a single active fetus in cephalic presentation. This examination is not dedicated for evaluation of the fetus. 2. Diffuse T2 hypointensity of the hepatic parenchyma with signal loss on inphase images, which can be seen in the setting of iron deposition. 3. Mildly enlarged bilateral external iliac lymph nodes, nonspecific and possibly reactive. No splenomegaly. 4. Appendix is not discretely seen. 5. 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 No specific follow-up imaging recommended. 6. Small bilateral pleural effusions. 7. Diffuse body wall edema. Electronically Signed   By: Agustin Cree M.D.   On: 09/30/2022 20:57   MR PELVIS WO CONTRAST  Result Date: 09/30/2022 CLINICAL DATA:  z EXAM: MRI ABDOMEN AND PELVIS WITHOUT CONTRAST TECHNIQUE: Multiplanar multisequence MR imaging of the abdomen and pelvis was performed. No intravenous contrast was administered. COMPARISON:  None Available. FINDINGS: COMBINED FINDINGS FOR BOTH MR ABDOMEN AND PELVIS Lower chest: Small bilateral pleural effusions. Please see separately dictated MR chest report for detailed findings. Hepatobiliary: Diffuse T2 hypointensity of the hepatic parenchyma with signal loss on inphase images. No bile duct dilation. Normal gallbladder. Pancreas: No mass, inflammatory changes, or other parenchymal abnormality identified. Spleen:  Within normal limits in size and appearance. Adrenals/Urinary Tract: 1.3 cm left adrenal nodule demonstrates signal dropout on out of phase images in  keeping with adenoma. No right adrenal nodule. No suspicious renal masses identified. No evidence of hydronephrosis. Stomach/Bowel: Visualized portions within the abdomen are unremarkable. Appendix is not discretely seen. Vascular/Lymphatic: Right external iliac lymph node measures 1.4 cm (33:30) and left external iliac lymph node measures 1.0 cm (33:30). No abdominal aortic aneurysm demonstrated. Reproductive: Gravid uterus containing a single active fetus in cephalic presentation. This examination is not dedicated for evaluation of the fetus. Placenta is fundal and posterior. Cervix is closed. Other:  Trace pelvic free fluid. Musculoskeletal: No suspicious bone lesions identified. Diffuse body wall edema. IMPRESSION: 1. Gravid uterus containing a single active fetus in cephalic presentation. This examination is not dedicated for evaluation of the fetus. 2. Diffuse T2 hypointensity of the hepatic parenchyma with signal loss on inphase images, which can be seen in the setting of iron deposition. 3. Mildly enlarged bilateral external iliac lymph nodes, nonspecific and possibly reactive. No splenomegaly. 4. Appendix is not discretely seen. 5. 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 No specific follow-up imaging recommended. 6. Small bilateral pleural effusions. 7. Diffuse body wall edema. Electronically Signed   By: Agustin Cree M.D.   On: 09/30/2022 20:57   ECHOCARDIOGRAM COMPLETE  Result Date: 09/29/2022    ECHOCARDIOGRAM REPORT   Patient Name:   Melissa Little Date of Exam: 09/29/2022 Medical Rec #:  [id number]    Height:       62.0 in Accession #:    [id number]   Weight:       128.0 lb Date of Birth:  Dec 24, 1985   BSA:          1.581 m Patient Age:  36 years     BP:           96/53 mmHg Patient Gender: F            HR:           87 bpm. Exam Location:  Inpatient Procedure: 2D Echo, Cardiac Doppler and Color Doppler Indications:    Fever  History:        Patient has no prior history of Echocardiogram examinations.                  Signs/Symptoms:pregnancy and Fever.  Sonographer:    Delcie Roch RDCS Referring Phys: [id number] CHARLIE PICKENS IMPRESSIONS  1. Left ventricular ejection fraction, by estimation, is 60 to 65%. The left ventricle has normal function. The left ventricle has no regional wall motion abnormalities. Left ventricular diastolic parameters were normal.  2. Right ventricular systolic function is normal. The right ventricular size is normal. Tricuspid regurgitation signal is inadequate for assessing PA pressure.  3. The mitral valve is normal in structure. No evidence of mitral valve regurgitation.  4. The aortic valve is grossly normal. Aortic valve regurgitation is not visualized.  5. The inferior vena cava is normal in size with greater than 50% respiratory variability, suggesting right atrial pressure of 3 mmHg. FINDINGS  Left Ventricle: Left ventricular ejection fraction, by estimation, is 60 to 65%. The left ventricle has normal function. The left ventricle has no regional wall motion abnormalities. The left ventricular internal cavity size was normal in size. There is  no left ventricular hypertrophy. Left ventricular diastolic parameters were normal. Right Ventricle: The right ventricular size is normal. Right ventricular systolic function is normal. Tricuspid regurgitation signal is inadequate for assessing PA pressure. Left Atrium: Left atrial size was normal in size. Right Atrium: Right atrial size was normal in size. Pericardium: Trivial pericardial effusion is present. Mitral Valve: The mitral valve is normal in structure. No evidence of mitral valve regurgitation. Tricuspid Valve: The tricuspid valve is normal in structure. Tricuspid valve regurgitation is trivial. Aortic Valve: The aortic valve is grossly normal. Aortic valve regurgitation is not visualized. Pulmonic Valve: The pulmonic valve was grossly normal. Pulmonic valve regurgitation is not visualized. Aorta: The aortic root and ascending  aorta are structurally normal, with no evidence of dilitation. Venous: The inferior vena cava is normal in size with greater than 50% respiratory variability, suggesting right atrial pressure of 3 mmHg. IAS/Shunts: No atrial level shunt detected by color flow Doppler.  LEFT VENTRICLE PLAX 2D LVIDd:         4.30 cm   Diastology LVIDs:         2.70 cm   LV e' medial:    9.25 cm/s LV PW:         0.90 cm   LV E/e' medial:  9.9 LV IVS:        0.90 cm   LV e' lateral:   14.00 cm/s LVOT diam:     1.90 cm   LV E/e' lateral: 6.5 LV SV:         66 LV SV Index:   42 LVOT Area:     2.84 cm  RIGHT VENTRICLE             IVC RV Basal diam:  2.40 cm     IVC diam: 1.50 cm RV S prime:     13.70 cm/s TAPSE (M-mode): 2.5 cm LEFT ATRIUM  Index        RIGHT ATRIUM           Index LA diam:        3.30 cm 2.09 cm/m   RA Area:     14.10 cm LA Vol (A2C):   41.9 ml 26.50 ml/m  RA Volume:   32.80 ml  20.74 ml/m LA Vol (A4C):   36.0 ml 22.76 ml/m LA Biplane Vol: 41.3 ml 26.12 ml/m  AORTIC VALVE LVOT Vmax:   120.00 cm/s LVOT Vmean:  77.100 cm/s LVOT VTI:    0.232 m  AORTA Ao Root diam: 2.60 cm Ao Asc diam:  2.30 cm MITRAL VALVE MV Area (PHT): 4.31 cm    SHUNTS MV Decel Time: 176 msec    Systemic VTI:  0.23 m MV E velocity: 91.30 cm/s  Systemic Diam: 1.90 cm MV A velocity: 55.50 cm/s MV E/A ratio:  1.65 Photographer signed by Carolan Clines Signature Date/Time: 09/29/2022/1:05:54 PM    Final     Current scheduled medications  docusate sodium  100 mg Oral Daily   ferrous sulfate  325 mg Oral QODAY   potassium chloride  40 mEq Oral BID   prenatal multivitamin  1 tablet Oral Q1200    I have reviewed the patient's current medications.  ASSESSMENT: Principal Problem:   Pyelonephritis Active Problems:   [redacted] weeks gestation of pregnancy   Pyelonephritis affecting pregnancy in second trimester   Fever, unknown origin   Sepsis (HCC)   PLAN: FUO - Small enlargement vs normal size of lymph nodes on MRI in  the subpectoral/axillary areas. Also noted some in pelvis as well.  - WBC 18 today, still with fevers intermittently. Continue tylenol for comfort and management of fever.  - Bcx NG thus far. Ucx negative - Cefepime, Vanc, Flagyl.  - CXR negative, Renal US negative - vasculitis work up negative  - EBV panel pending - Stool culture pending - TB test pending - Babesia pending - ID consulted and formal consult pending. If no other source found, would consider treatment with Lovenox. Would also consider MFM consult as well at that time   FWB - Continue NST daily  3. Routine PNC - Consider GTT once nausea improved - Tdap once well   Continue routine antenatal care.   Milas Hock, MD, FACOG Obstetrician & Gynecologist, [facility] for [facility], [facility]

## 2022-10-01 NOTE — Progress Notes (Signed)
!  800:   Tiffany Tukwila SW for [facility] CPS presented to [facility] with request to speak to [place] Quinonez.   I escorted Ms [place] to room 1S-05. Engineer, materials was present.

## 2022-10-01 NOTE — Progress Notes (Signed)
Discussed with ID the patient's MRI. Recommended discussing with hem/once. Spoke with Dr. Pamelia Hoit with hem/onc. Recommends LN biopsy vs FNA through IR. Consult placed and patient made NPO. Will discuss with patient.   Fever noted of 102.1 with tylenol given about 20 minutes prior. Will allow tylenol to take effect.   Milas Hock, MD Attending Obstetrician & Gynecologist, [facility] for [facility], [facility]

## 2022-10-01 NOTE — Procedures (Signed)
Interventional Radiology Procedure Note  Procedure: US guided biopsy of right axillary lymph node  Indication: Lymphadenopathy.  Fever of unknown origin.  Findings: Please refer to procedural dictation for full description.  Complications: None  EBL: < 10 mL  Acquanetta Belling, MD [phone]

## 2022-10-01 NOTE — Consult Note (Signed)
[facility]    Date of Admission:  09/28/2022     Reason for Consult: Fever of unknown origin     Referring Provider: Milas Hock, MD       Assessment: Melissa Little is a 37 y.o. female at [redacted] weeks gestation who denies any significant past medical history, was diagnosed with infectious mononucleosis 1 week ago, presented to the hospital complaining of chills/fever, N/V, suprapubic discomfort with urinary urgency and frequency but denies flank pain.  She reports >3 months of intermittent fevers, however all infectious workup has been negative.  Differential diagnosis for fevers this long includes rheumatologic process vs. Malignancy vs infectious etiology. She also had elevated ACE, which can be concerning for sarcoidosis.  Very low my differential, patient does not have any cough, or the painful rash on the lower extremities. Other infectious workup including Lyme, syphilis, HIV all normal. The long duration of fevers make infectious etiology rather less likely.   She had MRI of the chest, abdomen and pelvis, all negative for malignancy. There is evidence of increased lymph nodes in the subpectoral and axillary regions bilaterally.   She had a fever to about 102.1, WBC is elevated at 18.5.  We discussed the case with gynecology, and hematology recommends lymph node biopsy. Plan:  -Consult heme-onc, to discuss lymph node biopsy versus FNA. -ID will continue to follow. ------------------------------------------------ Principal Problem:   Pyelonephritis Active Problems:   [redacted] weeks gestation of pregnancy   Pyelonephritis affecting pregnancy in second trimester   Fever, unknown origin   Sepsis (HCC)    HPI: Melissa Little is a 37 y.o. female [id number] [redacted]w[redacted]d female present with a treatment history of fetal fever of unknown origin.  She reports intermittent fevers, which was preceded by a rash in her inner thighs.  She describes it the rash as  erythematous, non itchy.  Endorses myalgias, fatigue, otherwise denies chest pain, cough.    She denies any travel history. Partner works at a plant but they don't live together No exposure to unpasteurized milk. Denies any belly pain but intermittent headache.  Denies new medications.  Also denies any family history of cancers.   Family History  Problem Relation Age of Onset   COPD Father    Lung cancer Paternal Grandfather     Social History   Tobacco Use   Smoking status: Never   Smokeless tobacco: Never  Vaping Use   Vaping status: Never Used  Substance Use Topics   Alcohol use: Not Currently    Comment: occasional   Drug use: Not Currently    Types: Marijuana    No Known Allergies  Review of Systems: ROS All Other ROS was negative, except mentioned above   Past Medical History:  Diagnosis Date   Anemia      Scheduled Meds:  docusate sodium  100 mg Oral Daily   ferrous sulfate  325 mg Oral QODAY   potassium chloride  40 mEq Oral BID   prenatal multivitamin  1 tablet Oral Q1200   Continuous Infusions:  sodium chloride 125 mL/hr at 09/30/22 2018   lactated ringers 125 mL/hr at 09/30/22 2217   potassium chloride 10 mEq (10/01/22 1313)   promethazine (PHENERGAN) injection (IM or IVPB) 25 mg (10/01/22 0525)   PRN Meds:.acetaminophen, calcium carbonate, cyclobenzaprine, ondansetron (ZOFRAN) IV, promethazine (PHENERGAN) injection (IM or IVPB), zolpidem   OBJECTIVE: Blood pressure (!) 119/58, pulse (!) 117, temperature (!) 100.9 F (38.3 C),  temperature source Oral, resp. rate (!) 22, height 5\' 2"  (1.575 m), weight 58.1 kg, last menstrual period 03/23/2022, SpO2 93%.  Physical Exam  General: Pleasant, not in acute distress. CV: RRR. No murmurs, rubs, or gallops. No LE edema Pulmonary: Lungs clear bilaterally.   Abdominal: Soft, nontender, nondistended. Normal bowel sounds. Extremities: Palpable pulses. Normal ROM. Skin: Warm and dry.  Hypopigmented rash in  the medial thigh.  Lab Results Lab Results  Component Value Date   WBC 18.5 (H) 10/01/2022   HGB 9.1 (L) 10/01/2022   HCT 26.7 (L) 10/01/2022   MCV 80.2 10/01/2022   PLT 178 10/01/2022    Lab Results  Component Value Date   CREATININE 0.53 10/01/2022   BUN <5 (L) 10/01/2022   NA 133 (L) 10/01/2022   K 3.0 (L) 10/01/2022   CL 105 10/01/2022   CO2 20 (L) 10/01/2022    Lab Results  Component Value Date   ALT 10 09/29/2022   AST 35 09/29/2022   ALKPHOS 145 (H) 09/29/2022   BILITOT 0.4 09/29/2022      Microbiology: Recent Results (from the past 240 hour(s))  Resp panel by RT-PCR (RSV, Flu A&B, Covid) Anterior Nasal Swab     Status: None   Collection Time: 09/21/22  8:02 PM   Specimen: Anterior Nasal Swab  Result Value Ref Range Status   SARS Coronavirus 2 by RT PCR NEGATIVE NEGATIVE Final   Influenza A by PCR NEGATIVE NEGATIVE Final   Influenza B by PCR NEGATIVE NEGATIVE Final    Comment: (NOTE) The Xpert Xpress SARS-CoV-2/FLU/RSV plus assay is intended as an aid in the diagnosis of influenza from Nasopharyngeal swab specimens and should not be used as a sole basis for treatment. Nasal washings and aspirates are unacceptable for Xpert Xpress SARS-CoV-2/FLU/RSV testing.  Fact Sheet for Patients: BloggerCourse.com  Fact Sheet for Healthcare Providers: SeriousBroker.it  This test is not yet approved or cleared by the [place] FDA and has been authorized for detection and/or diagnosis of SARS-CoV-2 by FDA under an Emergency Use Authorization (EUA). This EUA will remain in effect (meaning this test can be used) for the duration of the COVID-19 declaration under Section 564(b)(1) of the Act, 21 U.S.C. section 360bbb-3(b)(1), unless the authorization is terminated or revoked.     Resp Syncytial Virus by PCR NEGATIVE NEGATIVE Final    Comment: (NOTE) Fact Sheet for  Patients: BloggerCourse.com  Fact Sheet for Healthcare Providers: SeriousBroker.it  This test is not yet approved or cleared by the [place] FDA and has been authorized for detection and/or diagnosis of SARS-CoV-2 by FDA under an Emergency Use Authorization (EUA). This EUA will remain in effect (meaning this test can be used) for the duration of the COVID-19 declaration under Section 564(b)(1) of the Act, 21 U.S.C. section 360bbb-3(b)(1), unless the authorization is terminated or revoked.  Performed at [facility], [address] [place], [place] [address]   Group A Strep by PCR     Status: None   Collection Time: 09/21/22  8:33 PM   Specimen: Throat; Sterile Swab  Result Value Ref Range Status   Group A Strep by PCR NOT DETECTED NOT DETECTED Final    Comment: Performed at [facility], [address] [place], [place] [address]  Resp panel by RT-PCR (RSV, Flu A&B, Covid) Anterior Nasal Swab     Status: None   Collection Time: 09/28/22  5:37 PM   Specimen: Anterior Nasal Swab  Result Value Ref Range  Status   SARS Coronavirus 2 by RT PCR NEGATIVE NEGATIVE Final   Influenza A by PCR NEGATIVE NEGATIVE Final   Influenza B by PCR NEGATIVE NEGATIVE Final    Comment: (NOTE) The Xpert Xpress SARS-CoV-2/FLU/RSV plus assay is intended as an aid in the diagnosis of influenza from Nasopharyngeal swab specimens and should not be used as a sole basis for treatment. Nasal washings and aspirates are unacceptable for Xpert Xpress SARS-CoV-2/FLU/RSV testing.  Fact Sheet for Patients: BloggerCourse.com  Fact Sheet for Healthcare Providers: SeriousBroker.it  This test is not yet approved or cleared by the [place] FDA and has been authorized for detection and/or diagnosis of SARS-CoV-2 by FDA under an Emergency Use Authorization (EUA). This EUA will remain in effect  (meaning this test can be used) for the duration of the COVID-19 declaration under Section 564(b)(1) of the Act, 21 U.S.C. section 360bbb-3(b)(1), unless the authorization is terminated or revoked.     Resp Syncytial Virus by PCR NEGATIVE NEGATIVE Final    Comment: (NOTE) Fact Sheet for Patients: BloggerCourse.com  Fact Sheet for Healthcare Providers: SeriousBroker.it  This test is not yet approved or cleared by the [place] FDA and has been authorized for detection and/or diagnosis of SARS-CoV-2 by FDA under an Emergency Use Authorization (EUA). This EUA will remain in effect (meaning this test can be used) for the duration of the COVID-19 declaration under Section 564(b)(1) of the Act, 21 U.S.C. section 360bbb-3(b)(1), unless the authorization is terminated or revoked.  Performed at [facility], [address] [place], [place] [address]   Culture, [place] Urine     Status: Abnormal   Collection Time: 09/28/22  5:37 PM   Specimen: OB Clean Catch; Urine  Result Value Ref Range Status   Specimen Description OB CLEAN CATCH  Final   Special Requests Normal  Final   Culture (A)  Final    <10,000 COLONIES/mL INSIGNIFICANT GROWTH NO GROUP B STREP (S.AGALACTIAE) ISOLATED Performed at [facility], [address] [place], [place] [address]    Report Status 09/29/2022 FINAL  Final  Culture, blood (Routine X 2) w Reflex to ID Panel     Status: None (Preliminary result)   Collection Time: 09/28/22  8:25 PM   Specimen: BLOOD  Result Value Ref Range Status   Specimen Description BLOOD RIGHT ANTECUBITAL  Final   Special Requests   Final    BOTTLES DRAWN AEROBIC AND ANAEROBIC Blood Culture adequate volume   Culture   Final    NO GROWTH 3 DAYS Performed at [facility], [address] [place], [place] [address]    Report Status PENDING  Incomplete  Culture, blood (Routine X 2) w Reflex to ID Panel     Status: None  (Preliminary result)   Collection Time: 09/28/22  8:27 PM   Specimen: BLOOD  Result Value Ref Range Status   Specimen Description BLOOD RIGHT ANTECUBITAL  Final   Special Requests   Final    BOTTLES DRAWN AEROBIC AND ANAEROBIC Blood Culture adequate volume   Culture   Final    NO GROWTH 3 DAYS Performed at [facility], [address] [place], [place] [address]    Report Status PENDING  Incomplete  C Difficile Quick Screen w PCR reflex     Status: None   Collection Time: 09/30/22  3:22 PM   Specimen: STOOL  Result Value Ref Range Status   C Diff antigen NEGATIVE NEGATIVE Final  C Diff toxin NEGATIVE NEGATIVE Final   C Diff interpretation No C. difficile detected.  Final    Comment: Performed at [facility], [address], [place], [place] [address]    Serology:   Imaging: If present, new imagings (plain films, ct scans, and mri) have been personally visualized and interpreted; radiology reports have been reviewed. Decision making incorporated into the Impression / Recommendations.   Laretta Bolster, MD [facility] [phone] pager    10/01/2022, 1:24 PM

## 2022-10-01 NOTE — Hospital Course (Addendum)
  Said she had a rash first and the fever came after Denies joint pain . Says she is always lethargic Rash is present at medial thigh bilaterally,says it doesn't hurt or itch .   This is her 5th pregnancy- [redacted] week gestation  Been having intermittent fevers for the past 5 months and has been coming to the hospital but hasn't never been hospitalize.Denies family history of cancer.No cough  Reported diarrhea yesterday . No recent sick contact.No recent new medications . No joint pains. No oral ulcers.   Works at [employer].

## 2022-10-01 NOTE — Progress Notes (Signed)
PROGRESS NOTE  Melissa Little UJW:[id number] DOB: 12-Mar-1985 DOA: 09/28/2022 PCP: [facility]  HPI/Recap of past 24 hours: Melissa Little is a 37 y.o. female at [redacted] weeks gestation who denies any significant past medical history, was diagnosed with infectious mononucleosis 1 week ago, presented to the hospital complaining of chills/fever, N/V, suprapubic discomfort with urinary urgency and frequency but denies flank pain. She also reports experiencing fevers intermittently over the past couple months and rash that comes and goes over a longer timeframe. No longer having sore-throat. Extensive infectious work up has been ordered, as well as vasculitic work up as well. There were no acute findings on chest x-ray. Blood and urine cultures still pending. Pt was placed on broad spectrum AB, adequate IVF given. TRH consulted for possible sepsis likely 2/2 UTI/pyelonephritis.    Today, pt still reports feeling poorly especially when having a temp spike. Denies any new complaints. Diarrhea appears to have resolved and may have been from recent AB use    Assessment/Plan: Principal Problem:   Pyelonephritis Active Problems:   [redacted] weeks gestation of pregnancy   Pyelonephritis affecting pregnancy in second trimester   Fever, unknown origin   Sepsis (HCC)   Sepsis  Febrile, tachycardic with leukocytosis on presentation Last temp 101.5 on 9/13, currently with leukocytosis Unsure Etiology infectious Vs malignancy MRI chest abdomen and pelvis showed mildly enlarged lymph nodes in the axillary and subpectoral regions possibly reactive inflammatory lymphoproliferative IR was consulted for ultrasound-guided biopsy of right axillary lymph node on 9/13, await results UA with large leukocyte, rare bacteria, WBC 21-50, UC with insig growth BC x 2 NGTD LA 2->WNL Procalcitonin 4.83 Chest x-ray unremarkable Influenza, COVID negative Continue IV fluids, monitor closely Discontinued  cefepime, vancomycin, Flagyl  ID consulted for further management, appreciate recs  Hypokalemia/hypomagnesemia/hyponatremia Replace as needed Continue IV fluids   Rash  Rash has been intermittent in BLE for the past couple months Unsure of etiology, possible vasculitis Extensive lab workup is in process and she may benefit from rheumatology consultation outpatient if needed      Estimated body mass index is 23.41 kg/m as calculated from the following:   Height as of this encounter: 5\' 2"  (1.575 m).   Weight as of this encounter: 58.1 kg.     Code Status: Full  Family Communication: None at bedside  Disposition Plan: Status is: Inpatient Remains inpatient appropriate because: Level of care      Consultants: [facility]  Procedures: Lymph node biopsy on 9/13  Antimicrobials: None  DVT prophylaxis: SCDs   Objective: Vitals:   10/01/22 1330 10/01/22 1340 10/01/22 1427 10/01/22 1500  BP:      Pulse:      Resp:   (!) 21   Temp:   98.4 F (36.9 C)   TempSrc:   Oral   SpO2: 90% 98% 99% 99%  Weight:      Height:        Intake/Output Summary (Last 24 hours) at 10/01/2022 1654 Last data filed at 10/01/2022 0424 Gross per 24 hour  Intake --  Output 1 ml  Net -1 ml   Filed Weights   09/28/22 1724  Weight: 58.1 kg    Exam: General: NAD  Cardiovascular: S1, S2 present Respiratory: CTAB Abdomen: Gravid, nontender Musculoskeletal: bilateral pedal edema noted Skin: Noted faint rash in bilateral lower extremities Psychiatry: Normal mood     Data Reviewed: CBC: Recent Labs  Lab 09/28/22 1929 09/29/22 0408 09/30/22 0530 10/01/22 5621  WBC 22.5* 32.1* 15.4* 18.5*  NEUTROABS 20.3*  --  11.1* 17.4*  HGB 9.6* 9.4* 8.3* 9.1*  HCT 28.8* 28.4* 26.0* 26.7*  MCV 77.6* 78.0* 80.2 80.2  PLT 215 195 191 178   Basic Metabolic Panel: Recent Labs  Lab 09/28/22 1929 09/29/22 0408 09/29/22 1453 09/30/22 0530 10/01/22 0512  NA 125* 126*  --  136  133*  K 2.3* 3.0*  --  3.1* 3.0*  CL 91* 96*  --  104 105  CO2 23 22  --  18* 20*  GLUCOSE 79 85  --  93 78  BUN 5* <5*  --  <5* <5*  CREATININE 0.53 0.53  --  0.60 0.53  CALCIUM 7.8* 7.4*  --  7.6* 7.6*  MG 1.5*  --  1.9  --   --    GFR: Estimated Creatinine Clearance: 76.2 mL/min (by C-G formula based on SCr of 0.53 mg/dL). Liver Function Tests: Recent Labs  Lab 09/28/22 1929 09/29/22 0408  AST 40 35  ALT 11 10  ALKPHOS 144* 145*  BILITOT 0.6 0.4  PROT 6.1* 5.4*  ALBUMIN 2.1* 1.8*   No results for input(s): "LIPASE", "AMYLASE" in the last 168 hours. No results for input(s): "AMMONIA" in the last 168 hours. Coagulation Profile: Recent Labs  Lab 09/28/22 1929  INR 1.4*   Cardiac Enzymes: No results for input(s): "CKTOTAL", "CKMB", "CKMBINDEX", "TROPONINI" in the last 168 hours. BNP (last 3 results) No results for input(s): "PROBNP" in the last 8760 hours. HbA1C: No results for input(s): "HGBA1C" in the last 72 hours. CBG: No results for input(s): "GLUCAP" in the last 168 hours. Lipid Profile: No results for input(s): "CHOL", "HDL", "LDLCALC", "TRIG", "CHOLHDL", "LDLDIRECT" in the last 72 hours. Thyroid Function Tests: Recent Labs    09/28/22 1929  TSH 2.138   Anemia Panel: No results for input(s): "VITAMINB12", "FOLATE", "FERRITIN", "TIBC", "IRON", "RETICCTPCT" in the last 72 hours. Urine analysis:    Component Value Date/Time   COLORURINE YELLOW 09/28/2022 1737   APPEARANCEUR CLOUDY (A) 09/28/2022 1737   LABSPEC <1.005 (L) 09/28/2022 1737   PHURINE 6.5 09/28/2022 1737   GLUCOSEU NEGATIVE 09/28/2022 1737   HGBUR LARGE (A) 09/28/2022 1737   BILIRUBINUR SMALL (A) 09/28/2022 1737   KETONESUR 15 (A) 09/28/2022 1737   PROTEINUR 30 (A) 09/28/2022 1737   UROBILINOGEN >=8.0 08/12/2022 1034   NITRITE NEGATIVE 09/28/2022 1737   LEUKOCYTESUR LARGE (A) 09/28/2022 1737   Sepsis Labs: @LABRCNTIP (procalcitonin:4,lacticidven:4)  ) Recent Results (from the past  240 hour(s))  Resp panel by RT-PCR (RSV, Flu A&B, Covid) Anterior Nasal Swab     Status: None   Collection Time: 09/21/22  8:02 PM   Specimen: Anterior Nasal Swab  Result Value Ref Range Status   SARS Coronavirus 2 by RT PCR NEGATIVE NEGATIVE Final   Influenza A by PCR NEGATIVE NEGATIVE Final   Influenza B by PCR NEGATIVE NEGATIVE Final    Comment: (NOTE) The Xpert Xpress SARS-CoV-2/FLU/RSV plus assay is intended as an aid in the diagnosis of influenza from Nasopharyngeal swab specimens and should not be used as a sole basis for treatment. Nasal washings and aspirates are unacceptable for Xpert Xpress SARS-CoV-2/FLU/RSV testing.  Fact Sheet for Patients: BloggerCourse.com  Fact Sheet for Healthcare Providers: SeriousBroker.it  This test is not yet approved or cleared by the [place] FDA and has been authorized for detection and/or diagnosis of SARS-CoV-2 by FDA under an Emergency Use Authorization (EUA). This EUA will remain in effect (meaning this test  can be used) for the duration of the COVID-19 declaration under Section 564(b)(1) of the Act, 21 U.S.C. section 360bbb-3(b)(1), unless the authorization is terminated or revoked.     Resp Syncytial Virus by PCR NEGATIVE NEGATIVE Final    Comment: (NOTE) Fact Sheet for Patients: BloggerCourse.com  Fact Sheet for Healthcare Providers: SeriousBroker.it  This test is not yet approved or cleared by the [place] FDA and has been authorized for detection and/or diagnosis of SARS-CoV-2 by FDA under an Emergency Use Authorization (EUA). This EUA will remain in effect (meaning this test can be used) for the duration of the COVID-19 declaration under Section 564(b)(1) of the Act, 21 U.S.C. section 360bbb-3(b)(1), unless the authorization is terminated or revoked.  Performed at [facility], [address]  [place], [place] [address]   Group A Strep by PCR     Status: None   Collection Time: 09/21/22  8:33 PM   Specimen: Throat; Sterile Swab  Result Value Ref Range Status   Group A Strep by PCR NOT DETECTED NOT DETECTED Final    Comment: Performed at [facility], [address], [place], [place] [address]  Resp panel by RT-PCR (RSV, Flu A&B, Covid) Anterior Nasal Swab     Status: None   Collection Time: 09/28/22  5:37 PM   Specimen: Anterior Nasal Swab  Result Value Ref Range Status   SARS Coronavirus 2 by RT PCR NEGATIVE NEGATIVE Final   Influenza A by PCR NEGATIVE NEGATIVE Final   Influenza B by PCR NEGATIVE NEGATIVE Final    Comment: (NOTE) The Xpert Xpress SARS-CoV-2/FLU/RSV plus assay is intended as an aid in the diagnosis of influenza from Nasopharyngeal swab specimens and should not be used as a sole basis for treatment. Nasal washings and aspirates are unacceptable for Xpert Xpress SARS-CoV-2/FLU/RSV testing.  Fact Sheet for Patients: BloggerCourse.com  Fact Sheet for Healthcare Providers: SeriousBroker.it  This test is not yet approved or cleared by the [place] FDA and has been authorized for detection and/or diagnosis of SARS-CoV-2 by FDA under an Emergency Use Authorization (EUA). This EUA will remain in effect (meaning this test can be used) for the duration of the COVID-19 declaration under Section 564(b)(1) of the Act, 21 U.S.C. section 360bbb-3(b)(1), unless the authorization is terminated or revoked.     Resp Syncytial Virus by PCR NEGATIVE NEGATIVE Final    Comment: (NOTE) Fact Sheet for Patients: BloggerCourse.com  Fact Sheet for Healthcare Providers: SeriousBroker.it  This test is not yet approved or cleared by the [place] FDA and has been authorized for detection and/or diagnosis of SARS-CoV-2 by FDA under an Emergency Use Authorization (EUA).  This EUA will remain in effect (meaning this test can be used) for the duration of the COVID-19 declaration under Section 564(b)(1) of the Act, 21 U.S.C. section 360bbb-3(b)(1), unless the authorization is terminated or revoked.  Performed at [facility], [address], [place], [place] [address]   Culture, [place] Urine     Status: Abnormal   Collection Time: 09/28/22  5:37 PM   Specimen: OB Clean Catch; Urine  Result Value Ref Range Status   Specimen Description OB CLEAN CATCH  Final   Special Requests Normal  Final   Culture (A)  Final    <10,000 COLONIES/mL INSIGNIFICANT GROWTH NO GROUP B STREP (S.AGALACTIAE) ISOLATED Performed at [facility], [address] [place], [place] [address]    Report Status 09/29/2022 FINAL  Final  Culture, blood (  Routine X 2) w Reflex to ID Panel     Status: None (Preliminary result)   Collection Time: 09/28/22  8:25 PM   Specimen: BLOOD  Result Value Ref Range Status   Specimen Description BLOOD RIGHT ANTECUBITAL  Final   Special Requests   Final    BOTTLES DRAWN AEROBIC AND ANAEROBIC Blood Culture adequate volume   Culture   Final    NO GROWTH 3 DAYS Performed at [facility], [address], [place], [place] [address]    Report Status PENDING  Incomplete  Culture, blood (Routine X 2) w Reflex to ID Panel     Status: None (Preliminary result)   Collection Time: 09/28/22  8:27 PM   Specimen: BLOOD  Result Value Ref Range Status   Specimen Description BLOOD RIGHT ANTECUBITAL  Final   Special Requests   Final    BOTTLES DRAWN AEROBIC AND ANAEROBIC Blood Culture adequate volume   Culture   Final    NO GROWTH 3 DAYS Performed at [facility], [address], [place], [place] [address]    Report Status PENDING  Incomplete  C Difficile Quick Screen w PCR reflex     Status: None   Collection Time: 09/30/22  3:22 PM   Specimen: STOOL  Result Value Ref Range Status   C Diff antigen NEGATIVE NEGATIVE Final   C Diff toxin  NEGATIVE NEGATIVE Final   C Diff interpretation No C. difficile detected.  Final    Comment: Performed at [facility], [address] [place], [place] [address]      Studies: MR CHEST WO CONTRAST  Result Date: 10/01/2022 CLINICAL DATA:  Fever of unknown origin.  Evaluate for lymphoma. EXAM: MR CHEST WITHOUT CONTRAST TECHNIQUE: Multiplanar, multisequence MR imaging of the chest was performed without administration of intravenous contrast. COMPARISON:  None Available. FINDINGS: Cardiovascular: Heart size normal.  No pericardial effusion. Mediastinum/Nodes: No evidence for mediastinal lymphadenopathy. Possible small lymph nodes in the right hilum. No left hilar lymphadenopathy. The esophagus has normal imaging features. Increased number of upper normal sized to mildly enlarged lymph nodes in the subpectoral and axillary regions bilaterally. Index left axillary node measures 12 mm short axis on image 36/15. Index right axillary node measures 14 mm short axis on 28/15. Lungs/Pleura: No gross pulmonary mass lesion. Tiny pulmonary nodules can be occult on MR imaging. No substantial consolidative airspace disease. Tiny bilateral pleural effusions evident. Musculoskeletal: No suspicious marrow signal abnormality. IMPRESSION: 1. Increased number of upper normal sized to mildly enlarged lymph nodes in the subpectoral and axillary regions bilaterally. Imaging features are nonspecific but can be seen in the setting of reactive, inflammatory or lymphoproliferative disease. These lymph nodes should be amenable to clinical inspection. 2. Tiny bilateral pleural effusions. Electronically Signed   By: Kennith Center M.D.   On: 10/01/2022 08:46   MR ABDOMEN WO CONTRAST  Result Date: 09/30/2022 CLINICAL DATA:  z EXAM: MRI ABDOMEN AND PELVIS WITHOUT CONTRAST TECHNIQUE: Multiplanar multisequence MR imaging of the abdomen and pelvis was performed. No intravenous contrast was administered. COMPARISON:  None Available.  FINDINGS: COMBINED FINDINGS FOR BOTH MR ABDOMEN AND PELVIS Lower chest: Small bilateral pleural effusions. Please see separately dictated MR chest report for detailed findings. Hepatobiliary: Diffuse T2 hypointensity of the hepatic parenchyma with signal loss on inphase images. No bile duct dilation. Normal gallbladder. Pancreas: No mass, inflammatory changes, or other parenchymal abnormality identified. Spleen:  Within normal limits in size and appearance. Adrenals/Urinary Tract:  1.3 cm left adrenal nodule demonstrates signal dropout on out of phase images in keeping with adenoma. No right adrenal nodule. No suspicious renal masses identified. No evidence of hydronephrosis. Stomach/Bowel: Visualized portions within the abdomen are unremarkable. Appendix is not discretely seen. Vascular/Lymphatic: Right external iliac lymph node measures 1.4 cm (33:30) and left external iliac lymph node measures 1.0 cm (33:30). No abdominal aortic aneurysm demonstrated. Reproductive: Gravid uterus containing a single active fetus in cephalic presentation. This examination is not dedicated for evaluation of the fetus. Placenta is fundal and posterior. Cervix is closed. Other:  Trace pelvic free fluid. Musculoskeletal: No suspicious bone lesions identified. Diffuse body wall edema. IMPRESSION: 1. Gravid uterus containing a single active fetus in cephalic presentation. This examination is not dedicated for evaluation of the fetus. 2. Diffuse T2 hypointensity of the hepatic parenchyma with signal loss on inphase images, which can be seen in the setting of iron deposition. 3. Mildly enlarged bilateral external iliac lymph nodes, nonspecific and possibly reactive. No splenomegaly. 4. Appendix is not discretely seen. 5. 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 No specific follow-up imaging recommended. 6. Small bilateral pleural effusions. 7. Diffuse body wall edema. Electronically Signed   By: Agustin Cree M.D.   On: 09/30/2022 20:57   MR  PELVIS WO CONTRAST  Result Date: 09/30/2022 CLINICAL DATA:  z EXAM: MRI ABDOMEN AND PELVIS WITHOUT CONTRAST TECHNIQUE: Multiplanar multisequence MR imaging of the abdomen and pelvis was performed. No intravenous contrast was administered. COMPARISON:  None Available. FINDINGS: COMBINED FINDINGS FOR BOTH MR ABDOMEN AND PELVIS Lower chest: Small bilateral pleural effusions. Please see separately dictated MR chest report for detailed findings. Hepatobiliary: Diffuse T2 hypointensity of the hepatic parenchyma with signal loss on inphase images. No bile duct dilation. Normal gallbladder. Pancreas: No mass, inflammatory changes, or other parenchymal abnormality identified. Spleen:  Within normal limits in size and appearance. Adrenals/Urinary Tract: 1.3 cm left adrenal nodule demonstrates signal dropout on out of phase images in keeping with adenoma. No right adrenal nodule. No suspicious renal masses identified. No evidence of hydronephrosis. Stomach/Bowel: Visualized portions within the abdomen are unremarkable. Appendix is not discretely seen. Vascular/Lymphatic: Right external iliac lymph node measures 1.4 cm (33:30) and left external iliac lymph node measures 1.0 cm (33:30). No abdominal aortic aneurysm demonstrated. Reproductive: Gravid uterus containing a single active fetus in cephalic presentation. This examination is not dedicated for evaluation of the fetus. Placenta is fundal and posterior. Cervix is closed. Other:  Trace pelvic free fluid. Musculoskeletal: No suspicious bone lesions identified. Diffuse body wall edema. IMPRESSION: 1. Gravid uterus containing a single active fetus in cephalic presentation. This examination is not dedicated for evaluation of the fetus. 2. Diffuse T2 hypointensity of the hepatic parenchyma with signal loss on inphase images, which can be seen in the setting of iron deposition. 3. Mildly enlarged bilateral external iliac lymph nodes, nonspecific and possibly reactive. No  splenomegaly. 4. Appendix is not discretely seen. 5. 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 No specific follow-up imaging recommended. 6. Small bilateral pleural effusions. 7. Diffuse body wall edema. Electronically Signed   By: Agustin Cree M.D.   On: 09/30/2022 20:57    Scheduled Meds:  docusate sodium  100 mg Oral Daily   ferrous sulfate  325 mg Oral QODAY   potassium chloride  40 mEq Oral BID   prenatal multivitamin  1 tablet Oral Q1200    Continuous Infusions:  sodium chloride 125 mL/hr at 09/30/22 2018   lactated ringers 125 mL/hr at  09/30/22 2217   promethazine (PHENERGAN) injection (IM or IVPB) 25 mg (10/01/22 0525)     LOS: 3 days     Briant Cedar, MD [facility]  If 7PM-7AM, please contact night-coverage [email or web address] 10/01/2022, 4:54 PM

## 2022-10-01 NOTE — Progress Notes (Signed)
NST: 160s, mod var, no decels Toco: irritable  Milas Hock, MD Attending Obstetrician & Gynecologist, [facility] for [facility], [facility]

## 2022-10-02 ENCOUNTER — Inpatient Hospital Stay (HOSPITAL_COMMUNITY): Payer: Medicaid Other

## 2022-10-02 DIAGNOSIS — O99891 Other specified diseases and conditions complicating pregnancy: Secondary | ICD-10-CM | POA: Diagnosis not present

## 2022-10-02 DIAGNOSIS — N12 Tubulo-interstitial nephritis, not specified as acute or chronic: Secondary | ICD-10-CM | POA: Diagnosis not present

## 2022-10-02 DIAGNOSIS — O2302 Infections of kidney in pregnancy, second trimester: Principal | ICD-10-CM

## 2022-10-02 DIAGNOSIS — R509 Fever, unspecified: Secondary | ICD-10-CM

## 2022-10-02 DIAGNOSIS — Z3A27 27 weeks gestation of pregnancy: Secondary | ICD-10-CM

## 2022-10-02 DIAGNOSIS — O09522 Supervision of elderly multigravida, second trimester: Secondary | ICD-10-CM

## 2022-10-02 LAB — CBC WITH DIFFERENTIAL/PLATELET
Abs Immature Granulocytes: 2 10*3/uL — ABNORMAL HIGH (ref 0.00–0.07)
Basophils Absolute: 0.3 10*3/uL — ABNORMAL HIGH (ref 0.0–0.1)
Basophils Relative: 1 %
Eosinophils Absolute: 0 10*3/uL (ref 0.0–0.5)
Eosinophils Relative: 0 %
HCT: 27.6 % — ABNORMAL LOW (ref 36.0–46.0)
Hemoglobin: 9 g/dL — ABNORMAL LOW (ref 12.0–15.0)
Lymphocytes Relative: 2 %
Lymphs Abs: 0.6 10*3/uL — ABNORMAL LOW (ref 0.7–4.0)
MCH: 25.4 pg — ABNORMAL LOW (ref 26.0–34.0)
MCHC: 32.6 g/dL (ref 30.0–36.0)
MCV: 78 fL — ABNORMAL LOW (ref 80.0–100.0)
Metamyelocytes Relative: 3 %
Monocytes Absolute: 0 10*3/uL — ABNORMAL LOW (ref 0.1–1.0)
Monocytes Relative: 0 %
Myelocytes: 4 %
Neutro Abs: 25.6 10*3/uL — ABNORMAL HIGH (ref 1.7–7.7)
Neutrophils Relative %: 90 %
Platelets: 161 10*3/uL (ref 150–400)
RBC: 3.54 MIL/uL — ABNORMAL LOW (ref 3.87–5.11)
RDW: 20.9 % — ABNORMAL HIGH (ref 11.5–15.5)
WBC: 28.4 10*3/uL — ABNORMAL HIGH (ref 4.0–10.5)
nRBC: 0.1 % (ref 0.0–0.2)
nRBC: 1 /100{WBCs} — ABNORMAL HIGH

## 2022-10-02 LAB — PROTEIN / CREATININE RATIO, URINE
Creatinine, Urine: 136 mg/dL
Protein Creatinine Ratio: 0.4 mg/mg{creat} — ABNORMAL HIGH (ref 0.00–0.15)
Total Protein, Urine: 55 mg/dL

## 2022-10-02 LAB — QUANTIFERON-TB GOLD PLUS: QuantiFERON-TB Gold Plus: UNDETERMINED — AB

## 2022-10-02 LAB — BASIC METABOLIC PANEL
Anion gap: 7 (ref 5–15)
BUN: <5 mg/dL — ABNORMAL LOW (ref 6–20)
CO2: 19 mmol/L — ABNORMAL LOW (ref 22–32)
Calcium: 7.1 mg/dL — ABNORMAL LOW (ref 8.9–10.3)
Chloride: 103 mmol/L (ref 98–111)
Creatinine, Ser: 0.46 mg/dL (ref 0.44–1.00)
GFR, Estimated: >60 mL/min (ref >60)
Glucose, Bld: 84 mg/dL (ref 70–99)
Potassium: 3.2 mmol/L — ABNORMAL LOW (ref 3.5–5.1)
Sodium: 129 mmol/L — ABNORMAL LOW (ref 135–145)

## 2022-10-02 LAB — QUANTIFERON-TB GOLD PLUS (RQFGPL)
QuantiFERON Mitogen Value: 0.41 [IU]/mL
QuantiFERON Nil Value: 0.28 [IU]/mL
QuantiFERON TB1 Ag Value: 0.28 [IU]/mL
QuantiFERON TB2 Ag Value: 0.28 [IU]/mL

## 2022-10-02 LAB — EPSTEIN-BARR VIRUS (EBV) ANTIBODY PROFILE
EBV NA IgG: >600 U/mL — ABNORMAL HIGH (ref 0.0–17.9)
EBV VCA IgG: 162 U/mL — ABNORMAL HIGH (ref 0.0–17.9)
EBV VCA IgM: <36 U/mL (ref 0.0–35.9)

## 2022-10-02 LAB — LACTIC ACID, PLASMA: Lactic Acid, Venous: 1 mmol/L (ref 0.5–1.9)

## 2022-10-02 MED ORDER — POTASSIUM CHLORIDE CRYS ER 20 MEQ PO TBCR
20.0000 meq | EXTENDED_RELEASE_TABLET | Freq: Two times a day (BID) | ORAL | Status: DC
  Administered 2022-10-02: 20 meq via ORAL
  Filled 2022-10-02: qty 1

## 2022-10-02 MED ORDER — SODIUM CHLORIDE 0.9 % IV BOLUS
750.0000 mL | Freq: Once | INTRAVENOUS | Status: AC
  Administered 2022-10-02: 750 mL via INTRAVENOUS

## 2022-10-02 MED ORDER — LACTATED RINGERS IV SOLN: INTRAVENOUS | Status: DC

## 2022-10-02 MED ORDER — POTASSIUM CHLORIDE 10 MEQ/100ML IV SOLN
10.0000 meq | INTRAVENOUS | Status: DC
  Administered 2022-10-02 (×2): 10 meq via INTRAVENOUS
  Filled 2022-10-02 (×2): qty 100

## 2022-10-02 MED ORDER — ACETAMINOPHEN 325 MG PO TABS
325.0000 mg | ORAL_TABLET | Freq: Once | ORAL | Status: AC
  Administered 2022-10-02: 325 mg via ORAL
  Filled 2022-10-02: qty 1

## 2022-10-02 MED ORDER — ENOXAPARIN SODIUM 40 MG/0.4ML IJ SOSY
40.0000 mg | PREFILLED_SYRINGE | INTRAMUSCULAR | Status: DC
  Administered 2022-10-02: 40 mg via SUBCUTANEOUS
  Filled 2022-10-02: qty 0.4

## 2022-10-02 NOTE — Progress Notes (Signed)
[facility]   Reason for visit: Follow up on leukocytosis and fever  Interval History: she reports no new symptoms.  No chills.  No new diarrhea, no new rash.     Physical Exam: Constitutional:  Vitals:   10/02/22 0847 10/02/22 1022  BP: 108/62   Pulse: (!) 106   Resp: 20   Temp: 98.6 F (37 C) 99.2 F (37.3 C)  SpO2: 97% 97%   patient appears in NAD Respiratory: Normal respiratory effort  Review of Systems: Constitutional: positive for fevers and malaise  Lab Results  Component Value Date   WBC 28.4 (H) 10/02/2022   HGB 9.0 (L) 10/02/2022   HCT 27.6 (L) 10/02/2022   MCV 78.0 (L) 10/02/2022   PLT 161 10/02/2022    Lab Results  Component Value Date   CREATININE 0.46 10/02/2022   BUN <5 (L) 10/02/2022   NA 129 (L) 10/02/2022   K 3.2 (L) 10/02/2022   CL 103 10/02/2022   CO2 19 (L) 10/02/2022    Lab Results  Component Value Date   ALT 10 09/29/2022   AST 35 09/29/2022   ALKPHOS 145 (H) 09/29/2022     Microbiology: Recent Results (from the past 240 hour(s))  Resp panel by RT-PCR (RSV, Flu A&B, Covid) Anterior Nasal Swab     Status: None   Collection Time: 09/28/22  5:37 PM   Specimen: Anterior Nasal Swab  Result Value Ref Range Status   SARS Coronavirus 2 by RT PCR NEGATIVE NEGATIVE Final   Influenza A by PCR NEGATIVE NEGATIVE Final   Influenza B by PCR NEGATIVE NEGATIVE Final    Comment: (NOTE) The Xpert Xpress SARS-CoV-2/FLU/RSV plus assay is intended as an aid in the diagnosis of influenza from Nasopharyngeal swab specimens and should not be used as a sole basis for treatment. Nasal washings and aspirates are unacceptable for Xpert Xpress SARS-CoV-2/FLU/RSV testing.  Fact Sheet for Patients: BloggerCourse.com  Fact Sheet for Healthcare Providers: SeriousBroker.it  This test is not yet approved or cleared by the [place] FDA and has been authorized for detection  and/or diagnosis of SARS-CoV-2 by FDA under an Emergency Use Authorization (EUA). This EUA will remain in effect (meaning this test can be used) for the duration of the COVID-19 declaration under Section 564(b)(1) of the Act, 21 U.S.C. section 360bbb-3(b)(1), unless the authorization is terminated or revoked.     Resp Syncytial Virus by PCR NEGATIVE NEGATIVE Final    Comment: (NOTE) Fact Sheet for Patients: BloggerCourse.com  Fact Sheet for Healthcare Providers: SeriousBroker.it  This test is not yet approved or cleared by the [place] FDA and has been authorized for detection and/or diagnosis of SARS-CoV-2 by FDA under an Emergency Use Authorization (EUA). This EUA will remain in effect (meaning this test can be used) for the duration of the COVID-19 declaration under Section 564(b)(1) of the Act, 21 U.S.C. section 360bbb-3(b)(1), unless the authorization is terminated or revoked.  Performed at [facility], [address], [place], [place] [address]   Culture, [place] Urine     Status: Abnormal   Collection Time: 09/28/22  5:37 PM   Specimen: OB Clean Catch; Urine  Result Value Ref Range Status   Specimen Description OB CLEAN CATCH  Final   Special Requests Normal  Final   Culture (A)  Final    <10,000 COLONIES/mL INSIGNIFICANT GROWTH NO GROUP B STREP (S.AGALACTIAE) ISOLATED Performed at [facility], [address] [place], [place]  [address]    Report Status 09/29/2022 FINAL  Final  Culture, blood (Routine X 2) w Reflex to ID Panel     Status: None (Preliminary result)   Collection Time: 09/28/22  8:25 PM   Specimen: BLOOD  Result Value Ref Range Status   Specimen Description BLOOD RIGHT ANTECUBITAL  Final   Special Requests   Final    BOTTLES DRAWN AEROBIC AND ANAEROBIC Blood Culture adequate volume   Culture   Final    NO GROWTH 4 DAYS Performed at [facility], [address], [place],  [place] [address]    Report Status PENDING  Incomplete  Culture, blood (Routine X 2) w Reflex to ID Panel     Status: None (Preliminary result)   Collection Time: 09/28/22  8:27 PM   Specimen: BLOOD  Result Value Ref Range Status   Specimen Description BLOOD RIGHT ANTECUBITAL  Final   Special Requests   Final    BOTTLES DRAWN AEROBIC AND ANAEROBIC Blood Culture adequate volume   Culture   Final    NO GROWTH 4 DAYS Performed at [facility], [address] [place], [place] [address]    Report Status PENDING  Incomplete  C Difficile Quick Screen w PCR reflex     Status: None   Collection Time: 09/30/22  3:22 PM   Specimen: STOOL  Result Value Ref Range Status   C Diff antigen NEGATIVE NEGATIVE Final   C Diff toxin NEGATIVE NEGATIVE Final   C Diff interpretation No C. difficile detected.  Final    Comment: Performed at [facility], [address], [place], [place] [address]    Impression/Plan:  1. Fever of unknown origin - low standing fever and no etiology known at this point.  Certainly hematologic malignancy of concern and biopsy results pending.  Work up has not revealed anything of consequence to date.  No significant liver abnormalities though will check a ferritin level for ? HLH.  Does not seem c/w a process such as multiple myeloma.    Dr. Renold Don will follow up again on Monday.   I have personally spent 55 minutes involved in face-to-face and non-face-to-face activities for this patient on the day of the visit. Professional time spent includes the following activities: Preparing to see the patient (review of tests), Obtaining and/or reviewing separately obtained history (admission/discharge record), Performing a medically appropriate examination and/or evaluation , Ordering medications/tests/procedures, referring and communicating with other health care professionals, Documenting clinical information in the EMR, Independently interpreting results (not separately reported),  Communicating results to the patient/family/caregiver, Counseling and educating the patient/family/caregiver and Care coordination (not separately reported).

## 2022-10-02 NOTE — Progress Notes (Addendum)
FACULTY PRACTICE ANTEPARTUM COMPREHENSIVE PROGRESS NOTE  Melissa Little is a 37 y.o. [id number] at [redacted]w[redacted]d who is admitted for chronic FUO.  Estimated Date of Delivery: 12/31/22 Fetal presentation is unsure.  Length of Stay:  4 Days. Admitted 09/28/2022  Subjective: Intermittent chills. No abdominal pain or uterine cramps/ctxns. Notes N/V. No HA currently. Sometimes low O2 sats when not on O2 and having a fever. Does  intermittently.   Patient reports good fetal movement.  She reports no uterine contractions, no bleeding and no loss of fluid per vagina.  Vitals:  Blood pressure 108/62, pulse (!) 106, temperature 98.6 F (37 C), temperature source Oral, resp. rate 20, height 5\' 2"  (1.575 m), weight 58.1 kg, last menstrual period 03/23/2022, SpO2 97%. Physical Examination: CONSTITUTIONAL: Well-developed, well-nourished female in no acute distress.  NEUROLOGIC: Alert and oriented to person, place, and time. No cranial nerve deficit noted. PSYCHIATRIC: Normal mood and affect. Normal behavior. Normal judgment and thought content. CARDIOVASCULAR: Normal heart rate noted, regular rhythm RESPIRATORY: Effort and breath sounds normal, no problems with respiration noted MUSCULOSKELETAL: Normal range of motion. No edema and no tenderness. 2+ distal pulses. ABDOMEN: Soft, nontender, nondistended, gravid. Extremities: Diffuse edema in legs and arms  NST not yet done today.   Results for orders placed or performed during the hospital encounter of 09/28/22 (from the past 48 hour(s))  C Difficile Quick Screen w PCR reflex     Status: None   Collection Time: 09/30/22  3:22 PM   Specimen: STOOL  Result Value Ref Range   C Diff antigen NEGATIVE NEGATIVE   C Diff toxin NEGATIVE NEGATIVE   C Diff interpretation No C. difficile detected.     Comment: Performed at [facility], [address], [place], [place] [address]  CBC with Differential/Platelet     Status: Abnormal   Collection Time: 10/01/22   5:12 AM  Result Value Ref Range   WBC 18.5 (H) 4.0 - 10.5 K/uL   RBC 3.33 (L) 3.87 - 5.11 MIL/uL   Hemoglobin 9.1 (L) 12.0 - 15.0 g/dL   HCT 81.1 (L) 91.4 - 78.2 %   MCV 80.2 80.0 - 100.0 fL   MCH 27.3 26.0 - 34.0 pg   MCHC 34.1 30.0 - 36.0 g/dL   RDW 95.6 (H) 21.3 - 08.6 %   Platelets 178 150 - 400 K/uL   nRBC 0.1 0.0 - 0.2 %   Neutrophils Relative % 94 %   Neutro Abs 17.4 (H) 1.7 - 7.7 K/uL   Lymphocytes Relative 2 %   Lymphs Abs 0.4 (L) 0.7 - 4.0 K/uL   Monocytes Relative 4 %   Monocytes Absolute 0.7 0.1 - 1.0 K/uL   Eosinophils Relative 0 %   Eosinophils Absolute 0.0 0.0 - 0.5 K/uL   Basophils Relative 0 %   Basophils Absolute 0.0 0.0 - 0.1 K/uL   nRBC 0 0 /100 WBC   Abs Immature Granulocytes 0.00 0.00 - 0.07 K/uL    Comment: Performed at [facility], [address] [place], [place] [address]  Basic metabolic panel     Status: Abnormal   Collection Time: 10/01/22  5:12 AM  Result Value Ref Range   Sodium 133 (L) 135 - 145 mmol/L   Potassium 3.0 (L) 3.5 - 5.1 mmol/L   Chloride 105 98 - 111 mmol/L   CO2 20 (L) 22 - 32 mmol/L   Glucose, Bld 78 70 - 99 mg/dL    Comment: Glucose reference range applies  only to samples taken after fasting for at least 8 hours.   BUN <5 (L) 6 - 20 mg/dL   Creatinine, Ser 5.62 0.44 - 1.00 mg/dL   Calcium 7.6 (L) 8.9 - 10.3 mg/dL   GFR, Estimated >13 >08 mL/min    Comment: (NOTE) Calculated using the CKD-EPI Creatinine Equation (2021)    Anion gap 8 5 - 15    Comment: Performed at [facility], [address], [place], [place] [address]  Protein / creatinine ratio, urine     Status: Abnormal   Collection Time: 10/01/22  9:27 PM  Result Value Ref Range   Creatinine, Urine 136 mg/dL   Total Protein, Urine 55 mg/dL    Comment: NO NORMAL RANGE ESTABLISHED FOR THIS TEST   Protein Creatinine Ratio 0.40 (H) 0.00 - 0.15 mg/mg[Cre]    Comment: Performed at [facility], [address], [place], [place] [address]  CBC with  Differential/Platelet     Status: Abnormal   Collection Time: 10/02/22  4:16 AM  Result Value Ref Range   WBC 28.4 (H) 4.0 - 10.5 K/uL   RBC 3.54 (L) 3.87 - 5.11 MIL/uL   Hemoglobin 9.0 (L) 12.0 - 15.0 g/dL   HCT 52.8 (L) 41.3 - 24.4 %   MCV 78.0 (L) 80.0 - 100.0 fL   MCH 25.4 (L) 26.0 - 34.0 pg   MCHC 32.6 30.0 - 36.0 g/dL   RDW 01.0 (H) 27.2 - 53.6 %   Platelets 161 150 - 400 K/uL   nRBC 0.1 0.0 - 0.2 %   Neutrophils Relative % 90 %   Neutro Abs 25.6 (H) 1.7 - 7.7 K/uL   Lymphocytes Relative 2 %   Lymphs Abs 0.6 (L) 0.7 - 4.0 K/uL   Monocytes Relative 0 %   Monocytes Absolute 0.0 (L) 0.1 - 1.0 K/uL   Eosinophils Relative 0 %   Eosinophils Absolute 0.0 0.0 - 0.5 K/uL   Basophils Relative 1 %   Basophils Absolute 0.3 (H) 0.0 - 0.1 K/uL   nRBC 1 (H) 0 /100 WBC   Metamyelocytes Relative 3 %   Myelocytes 4 %   Abs Immature Granulocytes 2.00 (H) 0.00 - 0.07 K/uL   Burr Cells PRESENT     Comment: Performed at [facility], [address] [place], [place] [address]  Basic metabolic panel     Status: Abnormal   Collection Time: 10/02/22  4:16 AM  Result Value Ref Range   Sodium 129 (L) 135 - 145 mmol/L   Potassium 3.2 (L) 3.5 - 5.1 mmol/L   Chloride 103 98 - 111 mmol/L   CO2 19 (L) 22 - 32 mmol/L   Glucose, Bld 84 70 - 99 mg/dL    Comment: Glucose reference range applies only to samples taken after fasting for at least 8 hours.   BUN <5 (L) 6 - 20 mg/dL   Creatinine, Ser 4.74 0.44 - 1.00 mg/dL   Calcium 7.1 (L) 8.9 - 10.3 mg/dL   GFR, Estimated >25 >95 mL/min    Comment: (NOTE) Calculated using the CKD-EPI Creatinine Equation (2021)    Anion gap 7 5 - 15    Comment: Performed at [facility], [address], [place], [place] [address]  Lactic acid, plasma     Status: None   Collection Time: 10/02/22  4:16 AM  Result Value Ref Range   Lactic Acid, Venous 1.0 0.5 - 1.9 mmol/L    Comment: Performed  at [facility], [address] [place], [place] [address]     IR LYMPH NODE CORE BIOPSY  Result Date: 10/02/2022 INDICATION: Fever of unknown origin.  Bilateral axillary adenopathy. EXAM: Ultrasound-guided biopsy of right axillary lymph node MEDICATIONS: None. ANESTHESIA/SEDATION: None COMPLICATIONS: None immediate. PROCEDURE: Informed written consent was obtained from the patient after a thorough discussion of the procedural risks, benefits and alternatives. All questions were addressed. Maximal Sterile Barrier Technique was utilized including caps, mask, sterile gowns, sterile gloves, sterile drape, hand hygiene and skin antiseptic. A timeout was performed prior to the initiation of the procedure. Patient positioned supine on the procedure table. The right axilla skin prepped and draped in the usual sterile fashion. Following local administration, 4-18 gauge cores were obtained from an enlarged right axillary lymph node utilizing continuous ultrasound guidance. All samples were sent to pathology in sterile saline. IMPRESSION: Ultrasound-guided biopsy of enlarged right axillary lymph node. Electronically Signed   By: Acquanetta Belling M.D.   On: 10/02/2022 06:26   MR CHEST WO CONTRAST  Result Date: 10/01/2022 CLINICAL DATA:  Fever of unknown origin.  Evaluate for lymphoma. EXAM: MR CHEST WITHOUT CONTRAST TECHNIQUE: Multiplanar, multisequence MR imaging of the chest was performed without administration of intravenous contrast. COMPARISON:  None Available. FINDINGS: Cardiovascular: Heart size normal.  No pericardial effusion. Mediastinum/Nodes: No evidence for mediastinal lymphadenopathy. Possible small lymph nodes in the right hilum. No left hilar lymphadenopathy. The esophagus has normal imaging features. Increased number of upper normal sized to mildly enlarged lymph nodes in the subpectoral and axillary regions bilaterally. Index left axillary node measures 12 mm short axis on image 36/15. Index right axillary node measures 14 mm short axis on 28/15. Lungs/Pleura: No  gross pulmonary mass lesion. Tiny pulmonary nodules can be occult on MR imaging. No substantial consolidative airspace disease. Tiny bilateral pleural effusions evident. Musculoskeletal: No suspicious marrow signal abnormality. IMPRESSION: 1. Increased number of upper normal sized to mildly enlarged lymph nodes in the subpectoral and axillary regions bilaterally. Imaging features are nonspecific but can be seen in the setting of reactive, inflammatory or lymphoproliferative disease. These lymph nodes should be amenable to clinical inspection. 2. Tiny bilateral pleural effusions. Electronically Signed   By: Kennith Center M.D.   On: 10/01/2022 08:46   MR ABDOMEN WO CONTRAST  Result Date: 09/30/2022 CLINICAL DATA:  z EXAM: MRI ABDOMEN AND PELVIS WITHOUT CONTRAST TECHNIQUE: Multiplanar multisequence MR imaging of the abdomen and pelvis was performed. No intravenous contrast was administered. COMPARISON:  None Available. FINDINGS: COMBINED FINDINGS FOR BOTH MR ABDOMEN AND PELVIS Lower chest: Small bilateral pleural effusions. Please see separately dictated MR chest report for detailed findings. Hepatobiliary: Diffuse T2 hypointensity of the hepatic parenchyma with signal loss on inphase images. No bile duct dilation. Normal gallbladder. Pancreas: No mass, inflammatory changes, or other parenchymal abnormality identified. Spleen:  Within normal limits in size and appearance. Adrenals/Urinary Tract: 1.3 cm left adrenal nodule demonstrates signal dropout on out of phase images in keeping with adenoma. No right adrenal nodule. No suspicious renal masses identified. No evidence of hydronephrosis. Stomach/Bowel: Visualized portions within the abdomen are unremarkable. Appendix is not discretely seen. Vascular/Lymphatic: Right external iliac lymph node measures 1.4 cm (33:30) and left external iliac lymph node measures 1.0 cm (33:30). No abdominal aortic aneurysm demonstrated. Reproductive: Gravid uterus containing a single  active fetus in cephalic presentation. This examination is not dedicated for evaluation of the fetus. Placenta is fundal and posterior. Cervix is closed. Other:  Trace pelvic  free fluid. Musculoskeletal: No suspicious bone lesions identified. Diffuse body wall edema. IMPRESSION: 1. Gravid uterus containing a single active fetus in cephalic presentation. This examination is not dedicated for evaluation of the fetus. 2. Diffuse T2 hypointensity of the hepatic parenchyma with signal loss on inphase images, which can be seen in the setting of iron deposition. 3. Mildly enlarged bilateral external iliac lymph nodes, nonspecific and possibly reactive. No splenomegaly. 4. Appendix is not discretely seen. 5. 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 No specific follow-up imaging recommended. 6. Small bilateral pleural effusions. 7. Diffuse body wall edema. Electronically Signed   By: Agustin Cree M.D.   On: 09/30/2022 20:57   MR PELVIS WO CONTRAST  Result Date: 09/30/2022 CLINICAL DATA:  z EXAM: MRI ABDOMEN AND PELVIS WITHOUT CONTRAST TECHNIQUE: Multiplanar multisequence MR imaging of the abdomen and pelvis was performed. No intravenous contrast was administered. COMPARISON:  None Available. FINDINGS: COMBINED FINDINGS FOR BOTH MR ABDOMEN AND PELVIS Lower chest: Small bilateral pleural effusions. Please see separately dictated MR chest report for detailed findings. Hepatobiliary: Diffuse T2 hypointensity of the hepatic parenchyma with signal loss on inphase images. No bile duct dilation. Normal gallbladder. Pancreas: No mass, inflammatory changes, or other parenchymal abnormality identified. Spleen:  Within normal limits in size and appearance. Adrenals/Urinary Tract: 1.3 cm left adrenal nodule demonstrates signal dropout on out of phase images in keeping with adenoma. No right adrenal nodule. No suspicious renal masses identified. No evidence of hydronephrosis. Stomach/Bowel: Visualized portions within the abdomen are  unremarkable. Appendix is not discretely seen. Vascular/Lymphatic: Right external iliac lymph node measures 1.4 cm (33:30) and left external iliac lymph node measures 1.0 cm (33:30). No abdominal aortic aneurysm demonstrated. Reproductive: Gravid uterus containing a single active fetus in cephalic presentation. This examination is not dedicated for evaluation of the fetus. Placenta is fundal and posterior. Cervix is closed. Other:  Trace pelvic free fluid. Musculoskeletal: No suspicious bone lesions identified. Diffuse body wall edema. IMPRESSION: 1. Gravid uterus containing a single active fetus in cephalic presentation. This examination is not dedicated for evaluation of the fetus. 2. Diffuse T2 hypointensity of the hepatic parenchyma with signal loss on inphase images, which can be seen in the setting of iron deposition. 3. Mildly enlarged bilateral external iliac lymph nodes, nonspecific and possibly reactive. No splenomegaly. 4. Appendix is not discretely seen. 5. 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 No specific follow-up imaging recommended. 6. Small bilateral pleural effusions. 7. Diffuse body wall edema. Electronically Signed   By: Agustin Cree M.D.   On: 09/30/2022 20:57    Current scheduled medications  docusate sodium  100 mg Oral Daily   ferrous sulfate  325 mg Oral QODAY   prenatal multivitamin  1 tablet Oral Q1200    I have reviewed the patient's current medications.  ASSESSMENT: Principal Problem:   Pyelonephritis Active Problems:   [redacted] weeks gestation of pregnancy   Pyelonephritis affecting pregnancy in second trimester   Fever, unknown origin   Sepsis (HCC)   PLAN: FUO - s/p Cefepime, Vanc, Flagyl but discontinued with negative cultures per ID.  - Imaging: CXR negative, Renal US negative. MRI without contrast of C/A/P: Small enlargement vs normal size of lymph nodes on MRI in the subpectoral/axillary areas. Also noted some in pelvis as well. - Vasculitis work up negative (given  prior rash) - EBV panel pending - Fungal cultures pending - Stool culture pending - Quantiferon indeterminate - Babesia pending - s/p LN biopsy on 9/13. Pathology pending - Bcx  NG x4d. Ucx negative - WBC 28 today, still with fevers intermittently. Continue tylenol for comfort and management of fever. No antibiotics at this time. Lactic acid 1.0. Tm today 103 responded quickly to tylenol. - Heme/onc reviewed chart and had no additional recommendations at this time besides LN biopsy and await pathology. - ID consulted and following - appreciate their input. If no other source found, would consider treatment with Lovenox vs doing pelvic MRI with contrast. I have discussed the patient with MFM and if unable to find source and LN biopsy is negative, would consider transfer to [facility] for consult with rheumatology and possible amnio.   FWB - Continue NST daily  3. Routine PNC - Consider GTT once nausea improved - Tdap once well   Continue routine antenatal care.   Milas Hock, MD, FACOG Obstetrician & Gynecologist, [facility], [facility]

## 2022-10-02 NOTE — Progress Notes (Signed)
PROGRESS NOTE  Melissa Little UEA:[id number] DOB: 1985/07/01 DOA: 09/28/2022 PCP: [facility]  HPI/Recap of past 24 hours: Melissa Little is a 37 y.o. female at [redacted] weeks gestation who denies any significant past medical history, was diagnosed with infectious mononucleosis 1 week ago, presented to the hospital complaining of chills/fever, N/V, suprapubic discomfort with urinary urgency and frequency but denies flank pain. She also reports experiencing fevers intermittently over the past couple months and rash that comes and goes over a longer timeframe. No longer having sore-throat. Extensive infectious work up has been ordered, as well as vasculitic work up as well. There were no acute findings on chest x-ray. Blood and urine cultures still pending. Pt was placed on broad spectrum AB, adequate IVF given. TRH consulted for possible sepsis likely 2/2 UTI/pyelonephritis.     Today, patient still spiking temp. No new complaints, still feeling poorly especially during temp spike.    Assessment/Plan: Principal Problem:   Pyelonephritis Active Problems:   [redacted] weeks gestation of pregnancy   Pyelonephritis affecting pregnancy in second trimester   Fever, unknown origin   Sepsis (HCC)   Sepsis  Febrile, tachycardic with leukocytosis on presentation Last temp 103 on 9/14, currently with leukocytosis Unsure Etiology infectious Vs malignancy Vs septic thrombophlebitis as per MFM MRI chest abdomen and pelvis showed mildly enlarged lymph nodes in the axillary and subpectoral regions possibly reactive inflammatory lymphoproliferative IR was consulted for ultrasound-guided biopsy of right axillary lymph node on 9/13, await results UA with large leukocyte, rare bacteria, WBC 21-50, UC with insig growth BC x 2 NGTD LA 2->WNL Procalcitonin 4.83 Chest x-ray unremarkable Influenza, COVID negative Continue IV fluids, monitor closely Discontinued cefepime, vancomycin, Flagyl  ID  consulted for further management, appreciate recs MFM consulted, appreciate rec- plan for therapeutic lovenox  Hypokalemia/hypomagnesemia/hyponatremia Replace as needed Continue IV fluids   Rash  Rash has been intermittent in BLE for the past couple months Unsure of etiology, possible vasculitis Extensive lab workup is in process and she may benefit from rheumatology consultation outpatient if needed      Estimated body mass index is 23.41 kg/m as calculated from the following:   Height as of this encounter: 5\' 2"  (1.575 m).   Weight as of this encounter: 58.1 kg.     Code Status: Full  Family Communication: None at bedside  Disposition Plan: Status is: Inpatient Remains inpatient appropriate because: Level of care      Consultants: [facility]  Procedures: Lymph node biopsy on 9/13  Antimicrobials: None  DVT prophylaxis: Lovenox   Objective: Vitals:   10/02/22 1530 10/02/22 1553 10/02/22 1628 10/02/22 1654  BP: (!) 113/55     Pulse: (!) 108   (!) 122  Resp: (!) 24   (!) 28  Temp: 98.3 F (36.8 C) 98.9 F (37.2 C) 99.4 F (37.4 C) 100.1 F (37.8 C)  TempSrc: Oral Oral Oral Oral  SpO2: 96%   100%  Weight:      Height:       No intake or output data in the 24 hours ending 10/02/22 1721  Filed Weights   09/28/22 1724  Weight: 58.1 kg    Exam: General: NAD  Cardiovascular: S1, S2 present Respiratory: CTAB Abdomen: Gravid, nontender Musculoskeletal: bilateral pedal edema noted Skin: Noted faint rash in bilateral lower extremities Psychiatry: Normal mood     Data Reviewed: CBC: Recent Labs  Lab 09/28/22 1929 09/29/22 0408 09/30/22 0530 10/01/22 0512 10/02/22 0416  WBC 22.5* 32.1*  15.4* 18.5* 28.4*  NEUTROABS 20.3*  --  11.1* 17.4* 25.6*  HGB 9.6* 9.4* 8.3* 9.1* 9.0*  HCT 28.8* 28.4* 26.0* 26.7* 27.6*  MCV 77.6* 78.0* 80.2 80.2 78.0*  PLT 215 195 191 178 161   Basic Metabolic Panel: Recent Labs  Lab 09/28/22 1929  09/29/22 0408 09/29/22 1453 09/30/22 0530 10/01/22 0512 10/02/22 0416  NA 125* 126*  --  136 133* 129*  K 2.3* 3.0*  --  3.1* 3.0* 3.2*  CL 91* 96*  --  104 105 103  CO2 23 22  --  18* 20* 19*  GLUCOSE 79 85  --  93 78 84  BUN 5* <5*  --  <5* <5* <5*  CREATININE 0.53 0.53  --  0.60 0.53 0.46  CALCIUM 7.8* 7.4*  --  7.6* 7.6* 7.1*  MG 1.5*  --  1.9  --   --   --    GFR: Estimated Creatinine Clearance: 76.2 mL/min (by C-G formula based on SCr of 0.46 mg/dL). Liver Function Tests: Recent Labs  Lab 09/28/22 1929 09/29/22 0408  AST 40 35  ALT 11 10  ALKPHOS 144* 145*  BILITOT 0.6 0.4  PROT 6.1* 5.4*  ALBUMIN 2.1* 1.8*   No results for input(s): "LIPASE", "AMYLASE" in the last 168 hours. No results for input(s): "AMMONIA" in the last 168 hours. Coagulation Profile: Recent Labs  Lab 09/28/22 1929  INR 1.4*   Cardiac Enzymes: No results for input(s): "CKTOTAL", "CKMB", "CKMBINDEX", "TROPONINI" in the last 168 hours. BNP (last 3 results) No results for input(s): "PROBNP" in the last 8760 hours. HbA1C: No results for input(s): "HGBA1C" in the last 72 hours. CBG: No results for input(s): "GLUCAP" in the last 168 hours. Lipid Profile: No results for input(s): "CHOL", "HDL", "LDLCALC", "TRIG", "CHOLHDL", "LDLDIRECT" in the last 72 hours. Thyroid Function Tests: No results for input(s): "TSH", "T4TOTAL", "FREET4", "T3FREE", "THYROIDAB" in the last 72 hours.  Anemia Panel: No results for input(s): "VITAMINB12", "FOLATE", "FERRITIN", "TIBC", "IRON", "RETICCTPCT" in the last 72 hours. Urine analysis:    Component Value Date/Time   COLORURINE YELLOW 09/28/2022 1737   APPEARANCEUR CLOUDY (A) 09/28/2022 1737   LABSPEC <1.005 (L) 09/28/2022 1737   PHURINE 6.5 09/28/2022 1737   GLUCOSEU NEGATIVE 09/28/2022 1737   HGBUR LARGE (A) 09/28/2022 1737   BILIRUBINUR SMALL (A) 09/28/2022 1737   KETONESUR 15 (A) 09/28/2022 1737   PROTEINUR 30 (A) 09/28/2022 1737   UROBILINOGEN >=8.0  08/12/2022 1034   NITRITE NEGATIVE 09/28/2022 1737   LEUKOCYTESUR LARGE (A) 09/28/2022 1737   Sepsis Labs: @LABRCNTIP (procalcitonin:4,lacticidven:4)  ) Recent Results (from the past 240 hour(s))  Resp panel by RT-PCR (RSV, Flu A&B, Covid) Anterior Nasal Swab     Status: None   Collection Time: 09/28/22  5:37 PM   Specimen: Anterior Nasal Swab  Result Value Ref Range Status   SARS Coronavirus 2 by RT PCR NEGATIVE NEGATIVE Final   Influenza A by PCR NEGATIVE NEGATIVE Final   Influenza B by PCR NEGATIVE NEGATIVE Final    Comment: (NOTE) The Xpert Xpress SARS-CoV-2/FLU/RSV plus assay is intended as an aid in the diagnosis of influenza from Nasopharyngeal swab specimens and should not be used as a sole basis for treatment. Nasal washings and aspirates are unacceptable for Xpert Xpress SARS-CoV-2/FLU/RSV testing.  Fact Sheet for Patients: BloggerCourse.com  Fact Sheet for Healthcare Providers: SeriousBroker.it  This test is not yet approved or cleared by the [place] FDA and has been authorized for detection and/or diagnosis  of SARS-CoV-2 by FDA under an Emergency Use Authorization (EUA). This EUA will remain in effect (meaning this test can be used) for the duration of the COVID-19 declaration under Section 564(b)(1) of the Act, 21 U.S.C. section 360bbb-3(b)(1), unless the authorization is terminated or revoked.     Resp Syncytial Virus by PCR NEGATIVE NEGATIVE Final    Comment: (NOTE) Fact Sheet for Patients: BloggerCourse.com  Fact Sheet for Healthcare Providers: SeriousBroker.it  This test is not yet approved or cleared by the [place] FDA and has been authorized for detection and/or diagnosis of SARS-CoV-2 by FDA under an Emergency Use Authorization (EUA). This EUA will remain in effect (meaning this test can be used) for the duration of the COVID-19 declaration  under Section 564(b)(1) of the Act, 21 U.S.C. section 360bbb-3(b)(1), unless the authorization is terminated or revoked.  Performed at [facility], [address] [place], [place] [address]   Culture, [place] Urine     Status: Abnormal   Collection Time: 09/28/22  5:37 PM   Specimen: OB Clean Catch; Urine  Result Value Ref Range Status   Specimen Description OB CLEAN CATCH  Final   Special Requests Normal  Final   Culture (A)  Final    <10,000 COLONIES/mL INSIGNIFICANT GROWTH NO GROUP B STREP (S.AGALACTIAE) ISOLATED Performed at [facility], [address] [place], [place] [address]    Report Status 09/29/2022 FINAL  Final  Culture, blood (Routine X 2) w Reflex to ID Panel     Status: None (Preliminary result)   Collection Time: 09/28/22  8:25 PM   Specimen: BLOOD  Result Value Ref Range Status   Specimen Description BLOOD RIGHT ANTECUBITAL  Final   Special Requests   Final    BOTTLES DRAWN AEROBIC AND ANAEROBIC Blood Culture adequate volume   Culture   Final    NO GROWTH 4 DAYS Performed at [facility], [address] [place], [place] [address]    Report Status PENDING  Incomplete  Culture, blood (Routine X 2) w Reflex to ID Panel     Status: None (Preliminary result)   Collection Time: 09/28/22  8:27 PM   Specimen: BLOOD  Result Value Ref Range Status   Specimen Description BLOOD RIGHT ANTECUBITAL  Final   Special Requests   Final    BOTTLES DRAWN AEROBIC AND ANAEROBIC Blood Culture adequate volume   Culture   Final    NO GROWTH 4 DAYS Performed at [facility], [address], [place], [place] [address]    Report Status PENDING  Incomplete  C Difficile Quick Screen w PCR reflex     Status: None   Collection Time: 09/30/22  3:22 PM   Specimen: STOOL  Result Value Ref Range Status   C Diff antigen NEGATIVE NEGATIVE Final   C Diff toxin NEGATIVE NEGATIVE Final   C Diff interpretation No C. difficile detected.  Final    Comment: Performed at  [facility], [address] [place], [place] [address]      Studies: No results found.  Scheduled Meds:  acetaminophen  325 mg Oral Once   docusate sodium  100 mg Oral Daily   enoxaparin (LOVENOX) injection  40 mg Subcutaneous Q24H   ferrous sulfate  325 mg Oral QODAY   prenatal multivitamin  1 tablet Oral Q1200    Continuous Infusions:  sodium chloride 125 mL/hr at 10/02/22 1027   potassium chloride 10 mEq (10/02/22 1659)  promethazine (PHENERGAN) injection (IM or IVPB) 25 mg (10/02/22 1638)   sodium chloride       LOS: 4 days     Briant Cedar, MD [facility]  If 7PM-7AM, please contact night-coverage [email or web address] 10/02/2022, 5:21 PM

## 2022-10-02 NOTE — Plan of Care (Signed)
  Problem: Education: Goal: Knowledge of disease or condition will improve Outcome: Progressing Goal: Knowledge of the prescribed therapeutic regimen will improve Outcome: Progressing Goal: Individualized Educational Video(s) Outcome: Progressing   Problem: Clinical Measurements: Goal: Complications related to the disease process, condition or treatment will be avoided or minimized Outcome: Progressing   Problem: Education: Goal: Knowledge of General Education information will improve Description: Including pain rating scale, medication(s)/side effects and non-pharmacologic comfort measures Outcome: Progressing   Problem: Health Behavior/Discharge Planning: Goal: Ability to manage health-related needs will improve Outcome: Progressing   Problem: Clinical Measurements: Goal: Ability to maintain clinical measurements within normal limits will improve Outcome: Progressing Goal: Will remain free from infection Outcome: Progressing Goal: Diagnostic test results will improve Outcome: Progressing Goal: Respiratory complications will improve Outcome: Progressing Goal: Cardiovascular complication will be avoided Outcome: Progressing   Problem: Activity: Goal: Risk for activity intolerance will decrease Outcome: Progressing   Problem: Nutrition: Goal: Adequate nutrition will be maintained Outcome: Progressing   Problem: Coping: Goal: Level of anxiety will decrease Outcome: Progressing   Problem: Elimination: Goal: Will not experience complications related to bowel motility Outcome: Progressing Goal: Will not experience complications related to urinary retention Outcome: Progressing   Problem: Pain Managment: Goal: General experience of comfort will improve Outcome: Progressing   Problem: Safety: Goal: Ability to remain free from injury will improve Outcome: Progressing   Problem: Skin Integrity: Goal: Risk for impaired skin integrity will decrease Outcome:  Progressing   Problem: Fluid Volume: Goal: Hemodynamic stability will improve Outcome: Progressing   Problem: Clinical Measurements: Goal: Diagnostic test results will improve Outcome: Progressing Goal: Signs and symptoms of infection will decrease Outcome: Progressing   Problem: Respiratory: Goal: Ability to maintain adequate ventilation will improve Outcome: Progressing

## 2022-10-02 NOTE — Consult Note (Signed)
MFM Note  Melissa Little is a 37 year old gravida 6 para 4 who is currently at 27 weeks and 1 day.  She has been hospitalized due to fever of unknown origin.  The patient continues to experience on and off fevers along with malaise and shaking chills.  She has been experiencing fevers on and off for the past 3 months.  She denies any travel history and does not report being bitten by bugs or other animals.  She has undergone an extensive workup for her fever of unknown origin.  Her blood cultures have been negative, her hepatitis workup has been negative, her HIV test is negative, her workup for Epstein-Barr virus is negative, her RSV, COVID, and influenza tests were all negative, she is rubella immune, her [facility] spotted fever antibodies were all negative, her RPR is nonreactive.  Her C. difficile test is negative.  Her group A strep test was negative.  Her urine culture was negative.  Her QuantiFERON test is indeterminant.  Her acid-fast culture and smear results are currently pending.  Her fungus culture is currently pending.  Her stool culture is pending.  She had a negative chest x-ray.  Her MRI of the abdomen, chest, and pelvis did not show any collections that would indicate an infection.  However, enlarged lymph nodes were noted in the axillary and iliac regions.    The patient underwent an ultrasound guided biopsy of an enlarged right axillary lymph node yesterday.  We are awaiting the results of that biopsy to evaluate for a malignancy.  A maternal echocardiogram showed an ejection fraction of between 60 to 65%.  There were no signs of cardiac infections noted on that exam.  Her antinuclear antibody test was negative along with complement levels that were elevated or within normal limits, making an acute lupus flare less likely.  Her rheumatological workup has been negative thus far.  The patient was treated with a few days of antibiotics (vancomycin, metronidazole, ceftriaxone,  cefepime) which were discontinued as her cultures were all negative.  She continues to have elevated white blood cell counts (28,400 today with a left shift).   I performed an ultrasound this morning showing that the fetus is in the vertex presentation with normal amniotic fluid.  A normal-appearing posterior placenta is noted.  There were no signs of debris or collections within the intra amniotic cavity that would suggest an intrauterine infection.  Vigorous fetal movements were noted throughout today's exam.  The EFW obtained 4 days ago was 2 pounds (917 g).  As the patient does not have uterine tenderness and there were no sonographic signs of an intra amniotic infection, I do not believe that an amniocentesis to rule out an intrauterine infection is indicated at this time.  Some esoteric causes of her recurrent fevers including septic pelvic thrombophlebitis should be considered.  The radiology team has suggested an MRI with contrast to evaluate for clots within the pelvic blood vessels.  However, not all cases of septic pelvic thrombophlebitis can be visualized on imaging studies.  The patient was started on Lovenox 40 mg daily today for DVT prophylaxis.  I would increase the dosage of Lovenox to 1 mg/kg daily (60 mg daily as she weighs 58 kg) for treatment of septic pelvic thrombophlebitis.  This may be started tomorrow as she is still recovering from her biopsy yesterday.  It usually takes 5 to 7 days of treatment with Lovenox for the fever to break if septic pelvic thrombophlebitis is the cause.  As the patient does not have an indication for immediate delivery, I would hold off on administering a course of antenatal corticosteroids for fetal benefits.  However, should she require treatment for sepsis, IV corticosteroids would not be contraindicated.   As the patient's rheumatological workup has all been negative, it is unlikely that her symptoms are due to a rheumatological cause.  We will  await the results of her biopsy to determine if a malignancy is present.    The patient understands that a transfer to an academic tertiary care center may be necessary should her symptoms persist and we are unable to find the cause of her symptoms.    The patient stated that all of her questions were answered today.    We will continue to follow her closely with you.

## 2022-10-03 DIAGNOSIS — Z3A27 27 weeks gestation of pregnancy: Secondary | ICD-10-CM

## 2022-10-03 DIAGNOSIS — N12 Tubulo-interstitial nephritis, not specified as acute or chronic: Secondary | ICD-10-CM | POA: Diagnosis not present

## 2022-10-03 DIAGNOSIS — D72825 Bandemia: Secondary | ICD-10-CM

## 2022-10-03 DIAGNOSIS — R509 Fever, unspecified: Secondary | ICD-10-CM | POA: Diagnosis not present

## 2022-10-03 LAB — CBC WITH DIFFERENTIAL/PLATELET
Abs Immature Granulocytes: 3.34 10*3/uL — ABNORMAL HIGH (ref 0.00–0.07)
Basophils Absolute: 0.2 10*3/uL — ABNORMAL HIGH (ref 0.0–0.1)
Basophils Relative: 1 %
Eosinophils Absolute: 0.2 10*3/uL (ref 0.0–0.5)
Eosinophils Relative: 1 %
HCT: 27.8 % — ABNORMAL LOW (ref 36.0–46.0)
Hemoglobin: 9.1 g/dL — ABNORMAL LOW (ref 12.0–15.0)
Immature Granulocytes: 14 %
Lymphocytes Relative: 9 %
Lymphs Abs: 2.1 10*3/uL (ref 0.7–4.0)
MCH: 25.9 pg — ABNORMAL LOW (ref 26.0–34.0)
MCHC: 32.7 g/dL (ref 30.0–36.0)
MCV: 79 fL — ABNORMAL LOW (ref 80.0–100.0)
Monocytes Absolute: 0.9 10*3/uL (ref 0.1–1.0)
Monocytes Relative: 4 %
Neutro Abs: 17.8 10*3/uL — ABNORMAL HIGH (ref 1.7–7.7)
Neutrophils Relative %: 71 %
Platelets: 147 10*3/uL — ABNORMAL LOW (ref 150–400)
RBC: 3.52 MIL/uL — ABNORMAL LOW (ref 3.87–5.11)
RDW: 21.3 % — ABNORMAL HIGH (ref 11.5–15.5)
WBC: 24.6 10*3/uL — ABNORMAL HIGH (ref 4.0–10.5)
nRBC: 0.1 % (ref 0.0–0.2)

## 2022-10-03 LAB — BASIC METABOLIC PANEL
Anion gap: 8 (ref 5–15)
BUN: <5 mg/dL — ABNORMAL LOW (ref 6–20)
CO2: 19 mmol/L — ABNORMAL LOW (ref 22–32)
Calcium: 7.3 mg/dL — ABNORMAL LOW (ref 8.9–10.3)
Chloride: 102 mmol/L (ref 98–111)
Creatinine, Ser: 0.51 mg/dL (ref 0.44–1.00)
GFR, Estimated: >60 mL/min (ref >60)
Glucose, Bld: 74 mg/dL (ref 70–99)
Potassium: 3.1 mmol/L — ABNORMAL LOW (ref 3.5–5.1)
Sodium: 129 mmol/L — ABNORMAL LOW (ref 135–145)

## 2022-10-03 LAB — CULTURE, BLOOD (ROUTINE X 2)
Culture: NO GROWTH
Culture: NO GROWTH
Special Requests: ADEQUATE
Special Requests: ADEQUATE

## 2022-10-03 LAB — LACTIC ACID, PLASMA
Lactic Acid, Venous: 1.1 mmol/L (ref 0.5–1.9)
Lactic Acid, Venous: 1.4 mmol/L (ref 0.5–1.9)

## 2022-10-03 LAB — FERRITIN: Ferritin: >7500 ng/mL — ABNORMAL HIGH (ref 11–307)

## 2022-10-03 LAB — MAGNESIUM: Magnesium: 1.1 mg/dL — ABNORMAL LOW (ref 1.7–2.4)

## 2022-10-03 MED ORDER — MAGNESIUM SULFATE 4 GM/100ML IV SOLN
4.0000 g | Freq: Once | INTRAVENOUS | Status: AC
  Administered 2022-10-03: 4 g via INTRAVENOUS
  Filled 2022-10-03: qty 100

## 2022-10-03 MED ORDER — POTASSIUM CHLORIDE 10 MEQ/100ML IV SOLN: 10.0000 meq | INTRAVENOUS | Status: DC

## 2022-10-03 MED ORDER — POTASSIUM CHLORIDE CRYS ER 20 MEQ PO TBCR
40.0000 meq | EXTENDED_RELEASE_TABLET | Freq: Two times a day (BID) | ORAL | Status: DC
  Administered 2022-10-03 – 2022-10-04 (×3): 40 meq via ORAL
  Filled 2022-10-03 (×3): qty 2

## 2022-10-03 MED ORDER — MAGNESIUM SULFATE 4 GM/100ML IV SOLN: 4.0000 g | Freq: Once | INTRAVENOUS | Status: DC

## 2022-10-03 MED ORDER — ENOXAPARIN SODIUM 60 MG/0.6ML IJ SOSY
60.0000 mg | PREFILLED_SYRINGE | Freq: Two times a day (BID) | INTRAMUSCULAR | Status: DC
  Administered 2022-10-03 – 2022-10-04 (×3): 60 mg via SUBCUTANEOUS
  Filled 2022-10-03 (×3): qty 0.6

## 2022-10-03 NOTE — Significant Event (Signed)
Rapid Response Event Note   Reason for Call :  Second set of eyes  Initial Focused Assessment:  Patient A&Ox4 with flat affect. Denies SOB or chest pain. Edema present in all extremities. Skin dry/cool. Bair hugger currently present. Current complaints of headache (frontal above eye) left jaw soreness and general stiffness.  114/51  HR 120 RR 20 O2 94% RA Temp 97.6 oral  Interventions/Plan of Care:  EKG: ST 120s Telemetry ordered CCM consulted  Event Summary:  MD Notified: Jaymes Graff MD Call Time: 2130 Arrival Time: 1106 End Time:   Truddie Crumble, RN

## 2022-10-03 NOTE — Consult Note (Signed)
NAME:  Melissa Little, MRN:  [id number], DOB:  1985-06-08, LOS: 5 ADMISSION DATE:  09/28/2022, CONSULTATION DATE: 10/03/2022 REFERRING MD: Milas Hock, MD, CHIEF COMPLAINT:   Fevers, hypothermia  History of Present Illness:   27-week pregnant lady with no significant past medical history presenting with chronic fever of unknown origin for the past 3 months.  She has had an extensive workup with negative cultures, negative hepatitis, HIV, Epstein-Barr, RSV, COVID, influenza, RMSF, RPR, C. difficile, group A strep.  QuantiFERON is indeterminate.  Imaging including chest x-ray and MRI are unremarkable  Underwent ultrasound-guided biopsy of the enlarged right axillary node on 9/13.  Autoimmune testing with ANA, complement levels are normal.  She was treated with few days of antibiotics for possible UTI which was discontinued by infectious disease as her workup was negative.  She was started on Lovenox at therapeutic dose for possible treatment of septic thrombophlebitis on 9/15.  Today she is hypothermic and PCCM consulted  Pertinent  Medical History    has a past medical history of Anemia.   Significant Hospital Events: Including procedures, antibiotic start and stop dates in addition to other pertinent events   9/10 admit 9/13 right axillary lymph node biopsy  Interim History / Subjective:    Objective   Blood pressure (!) 114/51, pulse (!) 114, temperature 97.6 F (36.4 C), temperature source Oral, resp. rate 20, height 5\' 2"  (1.575 m), weight 58.1 kg, last menstrual period 03/23/2022, SpO2 97%.        Intake/Output Summary (Last 24 hours) at 10/03/2022 1215 Last data filed at 10/03/2022 1100 Gross per 24 hour  Intake 1100.91 ml  Output --  Net 1100.91 ml   Filed Weights   09/28/22 1724  Weight: 58.1 kg   Examination: Blood pressure (!) 114/51, pulse (!) 114, temperature 97.6 F (36.4 C), temperature source Oral, resp. rate 20, height 5\' 2"  (1.575 m), weight 58.1 kg, last  menstrual period 03/23/2022, SpO2 96%. Gen:      No acute distress HEENT:  EOMI, sclera anicteric Neck:     No masses; no thyromegaly Lungs:    Clear to auscultation bilaterally; normal respiratory effort CV:         Regular rate and rhythm; no murmurs Abd:      + bowel sounds; soft, non-tender; no palpable masses, no distension Ext:    No edema; adequate peripheral perfusion Skin:      Warm and dry; no rash Neuro: alert and oriented x 3 Psych: normal mood and affect   Lab/imaging reviewed Significant for sodium 129, potassium 3.1, BUN/creatinine 5/0.51 WBC 24.6, hemoglobin 9.1, platelets 147 Angiotensin-converting enzyme 98  Resolved Hospital Problem list     Assessment & Plan:  Chronic fevers of unknown origin Symptoms have been going on since July with intermittent rash on her lower extremity Work up so far as noted above is negative Although she is hypothermic for the first time today overall she is stable and does not need ICU With high ferritin possibilities are adult onset stills. Macrophage activation syndrome is also consideration though less likely given that she does not have splenomegaly, LFT abnormalities or cytopenias Sarcoidosis is possible with borderline elevation of angiotensin-converting enzyme but there are no imaging abnormalities to suggest Will need evaluation for CMV infection   -Check CMV PCR -Follow lymph node biopsy results -Continue monitoring.  Does not need ICU at this point -Agree that she may need evaluation at tertiary center.  Best Practice (right click and "Reselect all SmartList  Selections" daily)   Per primary team  Signature:   Chilton Greathouse MD [facility] See Amion for pager  If no response to pager , please call [phone] until 7pm After 7:00 pm call Elink  [phone] 10/03/2022, 2:43 PM

## 2022-10-03 NOTE — Progress Notes (Addendum)
FACULTY PRACTICE ANTEPARTUM COMPREHENSIVE PROGRESS NOTE  Melissa Little is a 37 y.o. [id number] at [redacted]w[redacted]d who is admitted for chronic FUO.  Estimated Date of Delivery: 12/31/22 Fetal presentation is unsure.  Length of Stay:  5 Days. Admitted 09/28/2022  Subjective: Feeling okay today, slightly better than yesterday. No abdominal pain or uterine cramps/ctxns.  No HA. Sometimes low O2 sats when not on O2 and having a fever. Does [place] intermittently.   Patient reports good fetal movement.  She reports no uterine contractions, no bleeding and no loss of fluid per vagina.  Vitals:  Blood pressure (!) 110/56, pulse (!) 106, temperature (!) 95.7 F (35.4 C), temperature source Rectal, resp. rate 19, height 5\' 2"  (1.575 m), weight 58.1 kg, last menstrual period 03/23/2022, SpO2 97%. Physical Examination: CONSTITUTIONAL: Well-developed, well-nourished female in no acute distress.  NEUROLOGIC: Alert and oriented to person, place, and time. No cranial nerve deficit noted. PSYCHIATRIC: Normal mood and affect. Normal behavior. Normal judgment and thought content. CARDIOVASCULAR: Normal heart rate noted, regular rhythm RESPIRATORY: Effort and breath sounds normal, no problems with respiration noted MUSCULOSKELETAL: Normal range of motion. No edema and no tenderness. 2+ distal pulses. ABDOMEN: Soft, nontender, nondistended, gravid. Extremities: Diffuse edema in legs and arms  NST 150, mod var + accels, occasional variable decels but appropriate for gestational age.     [place] MFM OB LIMITED  Result Date: 10/02/2022 ----------------------------------------------------------------------  OBSTETRICS REPORT                        (Signed Final 10/02/2022 09:13 pm) ---------------------------------------------------------------------- Patient Info  ID #:       [id number]                          D.O.B.:  12-Feb-1985 (36 yrs)  Name:       Melissa Little                    Visit Date: 10/02/2022 01:12 pm  ---------------------------------------------------------------------- Performed By  Attending:        Ma Rings MD         Ref. Address:      [address]                                                              [place], [place]                                                              [address]  Performed By:     Melissa Little        [facility]  Referred By:  Melissa Little                    MD ---------------------------------------------------------------------- Orders  #  Description                           Code        Ordered By  1  [place] MFM OB LIMITED                     [id number]    Melissa Little ----------------------------------------------------------------------  #  Order #                     Accession #                Episode #  1  [id number]                   [id number]                 [id number] ---------------------------------------------------------------------- Indications  Advanced maternal age primigravida 74+,         O52.512  second trimester  [redacted] weeks gestation of pregnancy                 Z3A.27 ---------------------------------------------------------------------- Fetal Evaluation  Num Of Fetuses:          1  Fetal Heart Rate(bpm):   180  Cardiac Activity:        Observed  Presentation:            Cephalic  Placenta:                Posterior  Amniotic Fluid  AFI FV:      Within normal limits  AFI Sum(cm)     %Tile       Largest Pocket(cm)  5.9             < 3         5.9  RUQ(cm)  5.9 ---------------------------------------------------------------------- OB History  Gravidity:    6         Term:   4        Prem:   0        SAB:   1  TOP:          0       Ectopic:  0        Living: 4 ---------------------------------------------------------------------- Gestational Age  LMP:           27w 4d        Date:  03/23/22                   EDD:   12/28/22  Best:          27w 1d     Det. By:  Melissa Dubs         EDD:   12/31/22                                      (06/30/22) ---------------------------------------------------------------------- Comments  Melissa Little is a 37 year old gravida 6 para 4 who is  currently at 27 weeks and 1 day.  She has been hospitalized  due to fever of unknown origin.  The patient continues to  experience on and off fevers along with malaise and shaking  chills.  She has been experiencing fevers on and off for the  past 3 months.  She denies any travel history and does not  report being bitten by bugs or other animals.  She has undergone an extensive workup for her fever of  unknown origin.  Her blood cultures have been negative, her  hepatitis workup has been negative, her HIV test is negative,  her workup for Epstein-Barr virus is negative, her RSV,  COVID, and influenza tests were all negative, she is rubella  immune, her [facility] spotted fever antibodies were  all negative, her RPR is nonreactive.  Her C. difficile test is  negative.  Her group A strep test was negative.  Her urine  culture was negative.  Her QuantiFERON test is indeterminant.  Her acid-fast  culture and smear results are currently pending.  Her fungus  culture is currently pending.  Her stool culture is pending.  She had a negative chest x-ray.  Her MRI of the abdomen,  chest, and pelvis did not show any collections that would  indicate an infection.  However, enlarged lymph nodes were  noted in the axillary and iliac regions.  The patient underwent an ultrasound guided biopsy of an  enlarged right axillary lymph node yesterday.  We are  awaiting the results of that biopsy to evaluate for a  malignancy.  A maternal echocardiogram showed an ejection fraction of  between 60 to 65%.  There were no signs of cardiac  infections noted on that exam.  Her antinuclear antibody test was negative along with  complement levels that were elevated or within normal limits,  making an acute lupus flare less  likely.  Her rheumatological  workup has been negative thus far.  The patient was treated with a few days of antibiotics  (vancomycin, metronidazole, ceftriaxone, cefepime) which  were discontinued as her cultures were all negative.  She continues to have elevated white blood cell counts  (28,400 today with a left shift).  I performed an ultrasound this morning showing that the fetus  is in the vertex presentation with normal amniotic fluid.  A  normal-appearing posterior placenta is noted.  There were no  signs of debris or collections within the intra amniotic cavity  that would suggest an intrauterine infection.  Vigorous fetal  movements were noted throughout today's exam.  The EFW  obtained 4 days ago was 2 pounds (917 g).  As the patient does not have uterine tenderness and there  were no sonographic signs of an intra amniotic infection, I do  not believe that an amniocentesis to rule out an intrauterine  infection is indicated at this time.  Some esoteric causes of her recurrent fevers including septic  pelvic thrombophlebitis should be considered.  The radiology  team has suggested an MRI with contrast to evaluate for  clots within the pelvic blood vessels.  However, not all cases  of septic pelvic thrombophlebitis can be visualized on  imaging studies.  The patient was started on Lovenox 40 mg  daily today for DVT prophylaxis.  I would increase the dosage  of Lovenox to 1 mg/kg daily (60 mg daily as she weighs 58  kg) for treatment of septic pelvic thrombophlebitis.  This may  be started tomorrow as she is still recovering from her biopsy  yesterday.  It usually takes 5 to 7 days of treatment with  Lovenox for the fever to break if septic pelvic  thrombophlebitis is the cause.  As the patient does not  have an indication for immediate  delivery, I would hold off on administering a course of  antenatal corticosteroids for fetal benefits.  However, should  she require treatment for sepsis, IV corticosteroids  would not  be contraindicated.  As the patient's rheumatological workup has all been  negative, it is unlikely that her symptoms are due to a  rheumatological cause.  We will await the results of her  biopsy to determine if a malignancy is present.  The patient understands that a transfer to an academic  tertiary care center may be necessary should her symptoms  persist and we are unable to find the cause of her symptoms.  The patient stated that all of her questions were answered  today.  We will continue to follow her closely with you. ----------------------------------------------------------------------                   Melissa Rings, MD Electronically Signed Final Report   10/02/2022 09:13 pm ----------------------------------------------------------------------   IR LYMPH NODE CORE BIOPSY  Result Date: 10/02/2022 INDICATION: Fever of unknown origin.  Bilateral axillary adenopathy. EXAM: Ultrasound-guided biopsy of right axillary lymph node MEDICATIONS: None. ANESTHESIA/SEDATION: None COMPLICATIONS: None immediate. PROCEDURE: Informed written consent was obtained from the patient after a thorough discussion of the procedural risks, benefits and alternatives. All questions were addressed. Maximal Sterile Barrier Technique was utilized including caps, mask, sterile gowns, sterile gloves, sterile drape, hand hygiene and skin antiseptic. A timeout was performed prior to the initiation of the procedure. Patient positioned supine on the procedure table. The right axilla skin prepped and draped in the usual sterile fashion. Following local administration, 4-18 gauge cores were obtained from an enlarged right axillary lymph node utilizing continuous ultrasound guidance. All samples were sent to pathology in sterile saline. IMPRESSION: Ultrasound-guided biopsy of enlarged right axillary lymph node. Electronically Signed   By: Acquanetta Belling M.D.   On: 10/02/2022 06:26    Current scheduled medications  docusate sodium   100 mg Oral Daily   enoxaparin (LOVENOX) injection  60 mg Subcutaneous Q12H   ferrous sulfate  325 mg Oral QODAY   potassium chloride  40 mEq Oral BID   prenatal multivitamin  1 tablet Oral Q1200    I have reviewed the patient's current medications.  ASSESSMENT: Active Problems:   Anemia affecting pregnancy in second trimester   Leukocytosis   Fever, unknown origin   [redacted] weeks gestation of pregnancy   PLAN: FUO - s/p Cefepime, Vanc, Flagyl but discontinued with negative cultures per ID.  - Imaging: CXR negative, Renal US negative. Echo negative.  MRI without contrast of C/A/P: Small enlargement vs normal size of lymph nodes on MRI in the subpectoral/axillary areas. Also noted some in pelvis as well. - Vasculitis work up negative (given prior rash) - Quantiferon indeterminate - EBV panel consistent with past infection - Fungal cultures pending - Stool culture pending, Shiga neg - Babesia pending - s/p LN biopsy on 9/13. Pathology pending.  - Bcx NG x4d. Ucx negative - Heme/onc reviewed chart and had no additional recommendations at this time besides LN biopsy and await pathology. - TODAY: WBC 24 today after Lovenox 40 started for DVT prophylaxis. Started on 60 BID for therapeutic dosing for possible septic pelvic thrombophlebitis. She had had no fever since (Tm 100.1 since starting the Lovenox). RN then took temp and got oral of 27F - instructed to recheck with rectal and it was 95.7 shortly after I had seen her and visually she had appeared  improved and felt improved. I have ordered recheck blood cultures, lactic acid and ordered the ferritin level. Spoke with ID - confirmed we do not need to start antibiotics and they agreed with plan.  - I have discussed the patient with MFM and if unable to find source, lovenox doesn't help and LN biopsy is negative, would consider transfer to [facility] for consult with rheumatology and possible amnio although currently no concern for chorio.   FWB -  Continue NST qshift. Will place baby on monitor now for maternal low temp.   3. Electrolytes - Continue NS - Replace K orally after Magnesium given.   4. Routine PNC - Consider GTT once nausea improved - Tdap once well   Continue routine antenatal care.   Milas Hock, MD, FACOG Obstetrician & Gynecologist, [facility] for [facility], [facility]

## 2022-10-03 NOTE — Progress Notes (Signed)
PROGRESS NOTE  Melissa Little NFA:[id number] DOB: 05/17/1985 DOA: 09/28/2022 PCP: [facility]  HPI/Recap of past 24 hours: Melissa Wrobel is a 37 y.o. female at [redacted] weeks gestation who denies any significant past medical history, was diagnosed with infectious mononucleosis 1 week ago, presented to the hospital complaining of chills/fever, N/V, suprapubic discomfort with urinary urgency and frequency but denies flank pain. She also reports experiencing fevers intermittently over the past couple months and rash that comes and goes over a longer timeframe. No longer having sore-throat. Extensive infectious work up has been ordered, as well as vasculitic work up as well. There were no acute findings on chest x-ray. Blood and urine cultures still pending. Pt was placed on broad spectrum AB, adequate IVF given. TRH consulted for FUO      Today, patient noted to be persistently hypothermic, confirmed with rectal temp.  Bair hugger ordered for patient and PCCM consulted for additional management.  Ferritin noted to be significantly elevated, major differential for now will be adult onset stills disease.  As discussed with the team via secure chat earlier today, will touch base with Dr Dimple Casey from Rheumatology, but can only be done weekdays (tomorrow). He can look through her chat and let us know what he thinks, otherwise it will be to transfer her out for further management.     Assessment/Plan: Active Problems:   Anemia affecting pregnancy in second trimester   Leukocytosis   Fever, unknown origin   [redacted] weeks gestation of pregnancy   Sepsis  FUO- ??  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 Febrile, tachycardic with leukocytosis on presentation Last temp 103 on 9/14, now currently hypothermic with persistent leukocytosis Unsure Etiology autoimmune Vs infectious Vs malignancy Vs septic thrombophlebitis as per MFM MRI chest abdomen and pelvis showed mildly enlarged  lymph nodes in the axillary and subpectoral regions possibly reactive inflammatory lymphoproliferative IR was consulted for ultrasound-guided biopsy of right axillary lymph node on 9/13, awaiting results UA with large leukocyte, rare bacteria, WBC 21-50, UC with insig growth BC x 2 NGTD LA 2->WNL Procalcitonin 4.83 Chest x-ray unremarkable Influenza, COVID negative Continue IV fluids, monitor closely Discontinued cefepime, vancomycin, Flagyl  Plan to touch base with Dr Dimple Casey from Rheumatology on 9/16 for further recommendation ID consulted for further management, appreciate recs MFM consulted, appreciate rec- plan for therapeutic lovenox PCCM consulted, appreciate rec  Hypokalemia/hypomagnesemia/hyponatremia Replace as needed Continue IV fluids   Rash  Rash has been intermittent in BLE for the past couple months, ??still disease Rheumatology follow up outpatient if needed      Estimated body mass index is 23.41 kg/m as calculated from the following:   Height as of this encounter: 5\' 2"  (1.575 m).   Weight as of this encounter: 58.1 kg.     Code Status: Full  Family Communication: None at bedside  Disposition Plan: Status is: Inpatient Remains inpatient appropriate because: Level of care      Consultants: [facility]  Procedures: Lymph node biopsy on 9/13  Antimicrobials: None  DVT prophylaxis: Lovenox   Objective: Vitals:   10/03/22 1213 10/03/22 1300 10/03/22 1400 10/03/22 1528  BP:   (!) 115/45 (!) 111/48  Pulse:   (!) 122 (!) 123  Resp:   20 18  Temp:   98.6 F (37 C) 99 F (37.2 C)  TempSrc:   Oral Oral  SpO2: 96% 96% 96% 98%  Weight:      Height:  Intake/Output Summary (Last 24 hours) at 10/03/2022 1709 Last data filed at 10/03/2022 1100 Gross per 24 hour  Intake 1100.91 ml  Output --  Net 1100.91 ml    Filed Weights   09/28/22 1724  Weight: 58.1 kg    Exam: General: NAD  Cardiovascular: S1, S2 present Respiratory:  CTAB Abdomen: Gravid, nontender Musculoskeletal: bilateral pedal edema noted Skin: Noted faint rash in bilateral lower extremities Psychiatry: Fair mood     Data Reviewed: CBC: Recent Labs  Lab 09/28/22 1929 09/29/22 0408 09/30/22 0530 10/01/22 0512 10/02/22 0416 10/03/22 0400  WBC 22.5* 32.1* 15.4* 18.5* 28.4* 24.6*  NEUTROABS 20.3*  --  11.1* 17.4* 25.6* 17.8*  HGB 9.6* 9.4* 8.3* 9.1* 9.0* 9.1*  HCT 28.8* 28.4* 26.0* 26.7* 27.6* 27.8*  MCV 77.6* 78.0* 80.2 80.2 78.0* 79.0*  PLT 215 195 191 178 161 147*   Basic Metabolic Panel: Recent Labs  Lab 09/28/22 1929 09/29/22 0408 09/29/22 1453 09/30/22 0530 10/01/22 0512 10/02/22 0416 10/03/22 0400  NA 125* 126*  --  136 133* 129* 129*  K 2.3* 3.0*  --  3.1* 3.0* 3.2* 3.1*  CL 91* 96*  --  104 105 103 102  CO2 23 22  --  18* 20* 19* 19*  GLUCOSE 79 85  --  93 78 84 74  BUN 5* <5*  --  <5* <5* <5* <5*  CREATININE 0.53 0.53  --  0.60 0.53 0.46 0.51  CALCIUM 7.8* 7.4*  --  7.6* 7.6* 7.1* 7.3*  MG 1.5*  --  1.9  --   --   --  1.1*   GFR: Estimated Creatinine Clearance: 76.2 mL/min (by C-G formula based on SCr of 0.51 mg/dL). Liver Function Tests: Recent Labs  Lab 09/28/22 1929 09/29/22 0408  AST 40 35  ALT 11 10  ALKPHOS 144* 145*  BILITOT 0.6 0.4  PROT 6.1* 5.4*  ALBUMIN 2.1* 1.8*   No results for input(s): "LIPASE", "AMYLASE" in the last 168 hours. No results for input(s): "AMMONIA" in the last 168 hours. Coagulation Profile: Recent Labs  Lab 09/28/22 1929  INR 1.4*   Cardiac Enzymes: No results for input(s): "CKTOTAL", "CKMB", "CKMBINDEX", "TROPONINI" in the last 168 hours. BNP (last 3 results) No results for input(s): "PROBNP" in the last 8760 hours. HbA1C: No results for input(s): "HGBA1C" in the last 72 hours. CBG: No results for input(s): "GLUCAP" in the last 168 hours. Lipid Profile: No results for input(s): "CHOL", "HDL", "LDLCALC", "TRIG", "CHOLHDL", "LDLDIRECT" in the last 72 hours. Thyroid  Function Tests: No results for input(s): "TSH", "T4TOTAL", "FREET4", "T3FREE", "THYROIDAB" in the last 72 hours.  Anemia Panel: Recent Labs    10/03/22 1004  FERRITIN >7,500*   Urine analysis:    Component Value Date/Time   COLORURINE YELLOW 09/28/2022 1737   APPEARANCEUR CLOUDY (A) 09/28/2022 1737   LABSPEC <1.005 (L) 09/28/2022 1737   PHURINE 6.5 09/28/2022 1737   GLUCOSEU NEGATIVE 09/28/2022 1737   HGBUR LARGE (A) 09/28/2022 1737   BILIRUBINUR SMALL (A) 09/28/2022 1737   KETONESUR 15 (A) 09/28/2022 1737   PROTEINUR 30 (A) 09/28/2022 1737   UROBILINOGEN >=8.0 08/12/2022 1034   NITRITE NEGATIVE 09/28/2022 1737   LEUKOCYTESUR LARGE (A) 09/28/2022 1737   Sepsis Labs: @LABRCNTIP (procalcitonin:4,lacticidven:4)  ) Recent Results (from the past 240 hour(s))  Resp panel by RT-PCR (RSV, Flu A&B, Covid) Anterior Nasal Swab     Status: None   Collection Time: 09/28/22  5:37 PM   Specimen: Anterior Nasal Swab  Result  Value Ref Range Status   SARS Coronavirus 2 by RT PCR NEGATIVE NEGATIVE Final   Influenza A by PCR NEGATIVE NEGATIVE Final   Influenza B by PCR NEGATIVE NEGATIVE Final    Comment: (NOTE) The Xpert Xpress SARS-CoV-2/FLU/RSV plus assay is intended as an aid in the diagnosis of influenza from Nasopharyngeal swab specimens and should not be used as a sole basis for treatment. Nasal washings and aspirates are unacceptable for Xpert Xpress SARS-CoV-2/FLU/RSV testing.  Fact Sheet for Patients: BloggerCourse.com  Fact Sheet for Healthcare Providers: SeriousBroker.it  This test is not yet approved or cleared by the [place] FDA and has been authorized for detection and/or diagnosis of SARS-CoV-2 by FDA under an Emergency Use Authorization (EUA). This EUA will remain in effect (meaning this test can be used) for the duration of the COVID-19 declaration under Section 564(b)(1) of the Act, 21 U.S.C. section  360bbb-3(b)(1), unless the authorization is terminated or revoked.     Resp Syncytial Virus by PCR NEGATIVE NEGATIVE Final    Comment: (NOTE) Fact Sheet for Patients: BloggerCourse.com  Fact Sheet for Healthcare Providers: SeriousBroker.it  This test is not yet approved or cleared by the [place] FDA and has been authorized for detection and/or diagnosis of SARS-CoV-2 by FDA under an Emergency Use Authorization (EUA). This EUA will remain in effect (meaning this test can be used) for the duration of the COVID-19 declaration under Section 564(b)(1) of the Act, 21 U.S.C. section 360bbb-3(b)(1), unless the authorization is terminated or revoked.  Performed at [facility], [address], [place], [place] [address]   Culture, [place] Urine     Status: Abnormal   Collection Time: 09/28/22  5:37 PM   Specimen: OB Clean Catch; Urine  Result Value Ref Range Status   Specimen Description OB CLEAN CATCH  Final   Special Requests Normal  Final   Culture (A)  Final    <10,000 COLONIES/mL INSIGNIFICANT GROWTH NO GROUP B STREP (S.AGALACTIAE) ISOLATED Performed at [facility], [address] [place], [place] [address]    Report Status 09/29/2022 FINAL  Final  Culture, blood (Routine X 2) w Reflex to ID Panel     Status: None   Collection Time: 09/28/22  8:25 PM   Specimen: BLOOD  Result Value Ref Range Status   Specimen Description BLOOD RIGHT ANTECUBITAL  Final   Special Requests   Final    BOTTLES DRAWN AEROBIC AND ANAEROBIC Blood Culture adequate volume   Culture   Final    NO GROWTH 5 DAYS Performed at [facility], [address] [place], [place] [address]    Report Status 10/03/2022 FINAL  Final  Culture, blood (Routine X 2) w Reflex to ID Panel     Status: None   Collection Time: 09/28/22  8:27 PM   Specimen: BLOOD  Result Value Ref Range Status   Specimen Description BLOOD RIGHT ANTECUBITAL  Final    Special Requests   Final    BOTTLES DRAWN AEROBIC AND ANAEROBIC Blood Culture adequate volume   Culture   Final    NO GROWTH 5 DAYS Performed at [facility], [address], [place], [place] [address]    Report Status 10/03/2022 FINAL  Final  C Difficile Quick Screen w PCR reflex     Status: None   Collection Time: 09/30/22  3:22 PM   Specimen: STOOL  Result Value Ref Range Status   C Diff antigen NEGATIVE NEGATIVE Final  C Diff toxin NEGATIVE NEGATIVE Final   C Diff interpretation No C. difficile detected.  Final    Comment: Performed at [facility], [address], [place], [place] [address]  Stool culture     Status: None (Preliminary result)   Collection Time: 09/30/22  3:22 PM   Specimen: STOOL  Result Value Ref Range Status   Salmonella/Shigella Screen Final report  Final   Campylobacter Culture PENDING  Incomplete   E coli, Shiga toxin Assay Negative Negative Final    Comment: (NOTE) Performed At: [facility] [address] [place], [place] [id number] Jolene Schimke MD WV:[id number]   STOOL CULTURE REFLEX - RSASHR     Status: None   Collection Time: 09/30/22  3:22 PM  Result Value Ref Range Status   Stool Culture result 1 (RSASHR) Comment  Final    Comment: (NOTE) No Salmonella or Shigella recovered. Performed At: [facility] [address] [place], [place] [id number] Jolene Schimke MD KX:[id number]       Studies: No results found.  Scheduled Meds:  docusate sodium  100 mg Oral Daily   enoxaparin (LOVENOX) injection  60 mg Subcutaneous Q12H   potassium chloride  40 mEq Oral BID   prenatal multivitamin  1 tablet Oral Q1200    Continuous Infusions:  sodium chloride 125 mL/hr at 10/02/22 1729   lactated ringers Stopped (10/03/22 0043)   promethazine (PHENERGAN) injection (IM or IVPB) 25 mg (10/02/22 1638)     LOS: 5 days     Briant Cedar, MD [facility]  If 7PM-7AM, please contact  night-coverage [email or web address] 10/03/2022, 5:09 PM

## 2022-10-03 NOTE — Progress Notes (Signed)
This RN spoke with Dr. Sharolyn Douglas on unit regarding patient temperature management. Plan of care discussed.

## 2022-10-03 NOTE — Progress Notes (Signed)
This RN spoke with Rapid Response RN regarding temperature management. Rapid Response nurse in route.

## 2022-10-04 DIAGNOSIS — R509 Fever, unspecified: Secondary | ICD-10-CM | POA: Diagnosis not present

## 2022-10-04 DIAGNOSIS — O99012 Anemia complicating pregnancy, second trimester: Secondary | ICD-10-CM | POA: Diagnosis not present

## 2022-10-04 DIAGNOSIS — D72829 Elevated white blood cell count, unspecified: Secondary | ICD-10-CM | POA: Diagnosis not present

## 2022-10-04 DIAGNOSIS — D72825 Bandemia: Secondary | ICD-10-CM | POA: Diagnosis not present

## 2022-10-04 DIAGNOSIS — Z3A27 27 weeks gestation of pregnancy: Secondary | ICD-10-CM | POA: Diagnosis not present

## 2022-10-04 LAB — CBC WITH DIFFERENTIAL/PLATELET
Abs Immature Granulocytes: 1.8 10*3/uL — ABNORMAL HIGH (ref 0.00–0.07)
Basophils Absolute: 0.2 10*3/uL — ABNORMAL HIGH (ref 0.0–0.1)
Basophils Relative: 1 %
Eosinophils Absolute: 0.2 10*3/uL (ref 0.0–0.5)
Eosinophils Relative: 1 %
HCT: 29 % — ABNORMAL LOW (ref 36.0–46.0)
Hemoglobin: 9.4 g/dL — ABNORMAL LOW (ref 12.0–15.0)
Lymphocytes Relative: 0 %
Lymphs Abs: 0 10*3/uL — ABNORMAL LOW (ref 0.7–4.0)
MCH: 25.5 pg — ABNORMAL LOW (ref 26.0–34.0)
MCHC: 32.4 g/dL (ref 30.0–36.0)
MCV: 78.6 fL — ABNORMAL LOW (ref 80.0–100.0)
Metamyelocytes Relative: 7 %
Monocytes Absolute: 1.2 10*3/uL — ABNORMAL HIGH (ref 0.1–1.0)
Monocytes Relative: 6 %
Myelocytes: 2 %
Neutro Abs: 16.4 10*3/uL — ABNORMAL HIGH (ref 1.7–7.7)
Neutrophils Relative %: 83 %
Platelets: 147 10*3/uL — ABNORMAL LOW (ref 150–400)
RBC: 3.69 MIL/uL — ABNORMAL LOW (ref 3.87–5.11)
RDW: 21.7 % — ABNORMAL HIGH (ref 11.5–15.5)
WBC: 19.8 10*3/uL — ABNORMAL HIGH (ref 4.0–10.5)
nRBC: 0.1 % (ref 0.0–0.2)
nRBC: 1 /100{WBCs} — ABNORMAL HIGH

## 2022-10-04 LAB — HEPATIC FUNCTION PANEL
ALT: 17 U/L (ref 0–44)
AST: 73 U/L — ABNORMAL HIGH (ref 15–41)
Albumin: <1.5 g/dL — ABNORMAL LOW (ref 3.5–5.0)
Alkaline Phosphatase: 77 U/L (ref 38–126)
Bilirubin, Direct: <0.1 mg/dL (ref 0.0–0.2)
Total Bilirubin: 0.3 mg/dL (ref 0.3–1.2)
Total Protein: 4.3 g/dL — ABNORMAL LOW (ref 6.5–8.1)

## 2022-10-04 LAB — BASIC METABOLIC PANEL
Anion gap: 9 (ref 5–15)
BUN: 5 mg/dL — ABNORMAL LOW (ref 6–20)
CO2: 19 mmol/L — ABNORMAL LOW (ref 22–32)
Calcium: 7.5 mg/dL — ABNORMAL LOW (ref 8.9–10.3)
Chloride: 102 mmol/L (ref 98–111)
Creatinine, Ser: 0.49 mg/dL (ref 0.44–1.00)
GFR, Estimated: >60 mL/min (ref >60)
Glucose, Bld: 84 mg/dL (ref 70–99)
Potassium: 3.5 mmol/L (ref 3.5–5.1)
Sodium: 130 mmol/L — ABNORMAL LOW (ref 135–145)

## 2022-10-04 LAB — MAGNESIUM: Magnesium: 1.5 mg/dL — ABNORMAL LOW (ref 1.7–2.4)

## 2022-10-04 MED ORDER — MAGNESIUM SULFATE 4 GM/100ML IV SOLN
4.0000 g | Freq: Once | INTRAVENOUS | Status: AC
  Administered 2022-10-04: 4 g via INTRAVENOUS
  Filled 2022-10-04: qty 100

## 2022-10-04 NOTE — Progress Notes (Signed)
[facility]  Date of Admission:  09/28/2022     CC: Fever of unknown origin  ASSESSMENT: 37 yo female otherwise healthy [id number], admitted 9/10 for chronic fever in setting positive recent mononucleosis screen. She is currently [redacted] weeks pregnant. Infectious workup so far unremarkable. She has been hypothermic for the past 2 days, and ferritin> 7500. From ID's perspective, very low suspicion for infection as the etiology of her fever. I suspect there is Rheumatologic process going, and agree to transfer to tertiary academic center. [facility] has accepted pt, and she is amenable to this.  PLAN: Transfer to [facility] for further rheumatologic care. Follow up lymph node biopsy results. ID will sign off at this time.   Active Problems:   Anemia affecting pregnancy in second trimester   Leukocytosis   Fever, unknown origin   [redacted] weeks gestation of pregnancy   No Known Allergies  Scheduled Meds:  docusate sodium  100 mg Oral Daily   enoxaparin (LOVENOX) injection  60 mg Subcutaneous Q12H   potassium chloride  40 mEq Oral BID   prenatal multivitamin  1 tablet Oral Q1200   Continuous Infusions:  sodium chloride 125 mL/hr at 10/02/22 1729   lactated ringers Stopped (10/03/22 0043)   promethazine (PHENERGAN) injection (IM or IVPB) 25 mg (10/02/22 1638)   PRN Meds:.acetaminophen, calcium carbonate, cyclobenzaprine, ondansetron (ZOFRAN) IV, promethazine (PHENERGAN) injection (IM or IVPB), zolpidem   SUBJECTIVE: She feels tired, with intermittent chills. Denies new rashes.   Review of Systems: ROS All other ROS was negative, except mentioned above   OBJECTIVE: Vitals:   10/04/22 1013 10/04/22 1100 10/04/22 1159 10/04/22 1316  BP:   (!) 112/56 (!) 117/56  Pulse:   (!) 118 (!) 123  Resp:   20 18  Temp: (!) 97.5 F (36.4 C)  99 F (37.2 C) 98.7 F (37.1 C)  TempSrc: Oral  Oral Axillary  SpO2:  100% 100% 95%  Weight:      Height:       Body mass  index is 23.41 kg/m.  Physical Exam General: Pleasant, not in acute distress. CV: RRR. No murmurs, rubs, or gallops. No LE edema Pulmonary: Lungs clear bilaterally. Abdominal: Soft, nontender, nondistended. Normal bowel sounds. Extremities: Palpable pulses. Normal ROM. Skin: Warm and dry. No rash on her legs.  Lab Results Lab Results  Component Value Date   WBC 19.8 (H) 10/04/2022   HGB 9.4 (L) 10/04/2022   HCT 29.0 (L) 10/04/2022   MCV 78.6 (L) 10/04/2022   PLT 147 (L) 10/04/2022    Lab Results  Component Value Date   CREATININE 0.49 10/04/2022   BUN 5 (L) 10/04/2022   NA 130 (L) 10/04/2022   K 3.5 10/04/2022   CL 102 10/04/2022   CO2 19 (L) 10/04/2022    Lab Results  Component Value Date   ALT 17 10/04/2022   AST 73 (H) 10/04/2022   ALKPHOS 77 10/04/2022   BILITOT 0.3 10/04/2022      Microbiology: Recent Results (from the past 240 hour(s))  Resp panel by RT-PCR (RSV, Flu A&B, Covid) Anterior Nasal Swab     Status: None   Collection Time: 09/28/22  5:37 PM   Specimen: Anterior Nasal Swab  Result Value Ref Range Status   SARS Coronavirus 2 by RT PCR NEGATIVE NEGATIVE Final   Influenza A by PCR NEGATIVE NEGATIVE Final   Influenza B by PCR NEGATIVE NEGATIVE Final    Comment: (NOTE)  The Xpert Xpress SARS-CoV-2/FLU/RSV plus assay is intended as an aid in the diagnosis of influenza from Nasopharyngeal swab specimens and should not be used as a sole basis for treatment. Nasal washings and aspirates are unacceptable for Xpert Xpress SARS-CoV-2/FLU/RSV testing.  Fact Sheet for Patients: BloggerCourse.com  Fact Sheet for Healthcare Providers: SeriousBroker.it  This test is not yet approved or cleared by the [place] FDA and has been authorized for detection and/or diagnosis of SARS-CoV-2 by FDA under an Emergency Use Authorization (EUA). This EUA will remain in effect (meaning this test can be used) for the  duration of the COVID-19 declaration under Section 564(b)(1) of the Act, 21 U.S.C. section 360bbb-3(b)(1), unless the authorization is terminated or revoked.     Resp Syncytial Virus by PCR NEGATIVE NEGATIVE Final    Comment: (NOTE) Fact Sheet for Patients: BloggerCourse.com  Fact Sheet for Healthcare Providers: SeriousBroker.it  This test is not yet approved or cleared by the [place] FDA and has been authorized for detection and/or diagnosis of SARS-CoV-2 by FDA under an Emergency Use Authorization (EUA). This EUA will remain in effect (meaning this test can be used) for the duration of the COVID-19 declaration under Section 564(b)(1) of the Act, 21 U.S.C. section 360bbb-3(b)(1), unless the authorization is terminated or revoked.  Performed at [facility], [address] [place], [place] [address]   Culture, [place] Urine     Status: Abnormal   Collection Time: 09/28/22  5:37 PM   Specimen: OB Clean Catch; Urine  Result Value Ref Range Status   Specimen Description OB CLEAN CATCH  Final   Special Requests Normal  Final   Culture (A)  Final    <10,000 COLONIES/mL INSIGNIFICANT GROWTH NO GROUP B STREP (S.AGALACTIAE) ISOLATED Performed at [facility], [address] [place], [place] [address]    Report Status 09/29/2022 FINAL  Final  Culture, blood (Routine X 2) w Reflex to ID Panel     Status: None   Collection Time: 09/28/22  8:25 PM   Specimen: BLOOD  Result Value Ref Range Status   Specimen Description BLOOD RIGHT ANTECUBITAL  Final   Special Requests   Final    BOTTLES DRAWN AEROBIC AND ANAEROBIC Blood Culture adequate volume   Culture   Final    NO GROWTH 5 DAYS Performed at [facility], [address] [place], [place] [address]    Report Status 10/03/2022 FINAL  Final  Culture, blood (Routine X 2) w Reflex to ID Panel     Status: None   Collection Time: 09/28/22  8:27 PM   Specimen: BLOOD   Result Value Ref Range Status   Specimen Description BLOOD RIGHT ANTECUBITAL  Final   Special Requests   Final    BOTTLES DRAWN AEROBIC AND ANAEROBIC Blood Culture adequate volume   Culture   Final    NO GROWTH 5 DAYS Performed at [facility], [address] [place], [place] [address]    Report Status 10/03/2022 FINAL  Final  C Difficile Quick Screen w PCR reflex     Status: None   Collection Time: 09/30/22  3:22 PM   Specimen: STOOL  Result Value Ref Range Status   C Diff antigen NEGATIVE NEGATIVE Final   C Diff toxin NEGATIVE NEGATIVE Final   C Diff interpretation No C. difficile detected.  Final    Comment: Performed at [facility], [address], [place], [place] [address]  Stool culture  Status: None (Preliminary result)   Collection Time: 09/30/22  3:22 PM   Specimen: STOOL  Result Value Ref Range Status   Salmonella/Shigella Screen Final report  Final   Campylobacter Culture PENDING  Incomplete   E coli, Shiga toxin Assay Negative Negative Final    Comment: (NOTE) Performed At: [facility] [address] [place], [place] [id number] Jolene Schimke MD OZ:[id number]   STOOL CULTURE REFLEX - RSASHR     Status: None   Collection Time: 09/30/22  3:22 PM  Result Value Ref Range Status   Stool Culture result 1 (RSASHR) Comment  Final    Comment: (NOTE) No Salmonella or Shigella recovered. Performed At: [facility] [address] [place], [place] [id number] Jolene Schimke MD KG:[id number]   Culture, blood (Routine X 2) w Reflex to ID Panel     Status: None (Preliminary result)   Collection Time: 10/03/22  9:54 AM   Specimen: BLOOD LEFT ARM  Result Value Ref Range Status   Specimen Description BLOOD LEFT ARM  Final   Special Requests   Final    BOTTLES DRAWN AEROBIC AND ANAEROBIC Blood Culture results may not be optimal due to an inadequate volume of blood received in culture bottles   Culture   Final    NO GROWTH < 24  HOURS Performed at [facility], [address] [place], [place] [address]    Report Status PENDING  Incomplete  Culture, blood (Routine X 2) w Reflex to ID Panel     Status: None (Preliminary result)   Collection Time: 10/03/22 10:04 AM   Specimen: BLOOD LEFT ARM  Result Value Ref Range Status   Specimen Description BLOOD LEFT ARM  Final   Special Requests   Final    BOTTLES DRAWN AEROBIC AND ANAEROBIC Blood Culture results may not be optimal due to an inadequate volume of blood received in culture bottles   Culture   Final    NO GROWTH < 24 HOURS Performed at [facility], [address] [place], [place] [address]    Report Status PENDING  Incomplete   Serology:  Imaging: If present, new imagings (plain films, ct scans, and mri) have been personally visualized and interpreted; radiology reports have been reviewed. Decision making incorporated into the Impression / Recommendations.  Laretta Bolster, M.D.  Internal Medicine Resident, PGY-1 [facility]  Pager: [phone] 1:49 PM, 10/04/2022

## 2022-10-04 NOTE — Progress Notes (Signed)
Lab notified RN that patient is declining labs at this time. Dr. Vergie Living in department and notified at [phone].

## 2022-10-04 NOTE — Progress Notes (Signed)
Temp 94.1 with morning VS, provider notified. Warm blankets placed and room temperature increased.

## 2022-10-04 NOTE — Discharge Summary (Addendum)
Physician Discharge Summary  Patient ID: Melissa Little MRN: [id number] DOB/AGE: 02/04/1985 37 y.o. OBGYN: [facility] [place], [place]) Admit date: 09/28/2022 Discharge date: 10/04/2022  Admission Diagnoses: Pregnancy at 26/4. Chronic fever of unknown origin, presumed pyelonephritis. Leg skin rash. AMA. Anemia  Discharge Diagnoses: Pregnancy at 27/3. FUO. AMA. Anemia  Discharged Condition: fair  Hospital Course:  *Pregnancy: There were no pregnancy concerns during her hospitalization, and the baby was category I with accels and no contractions during her hospitalization. 9/14 MFM surveillance u/s showed normal AFI, placenta; last growth u/s on 9/10 was: 27%, 917gm, ac 33%, afi 8.8 *FUO: patient presented to [facility] triage with n/v and noted to have fevers 101-102, as well as a skin rash; at her new OB appointment at 19wks, she presented with malaise and had a fever, tachycardia and was treated for presumed pyeonephritis. The urine culture was negative. She was admitted from [facility] triage with presumed recurrent pyelo/FUO and started on rocephin. In addition to this, multiple labs were ordered. During her hospitalization, she kept spiking recurrent fevers, as well as meeting other SIRS criteria, but her cultures were negative. Hospitalist service consulted on the day of admission and recommended increasing her abx coverage to cefepime and vanc. ID followed during her course and she was eventually taken off of all abx due to repeated cultures being negative.  In terms of imaging, she had MR chest/abdomen/pelvis with non specific adenopathy, and on 9/13, she underwent IR guided biopsy of LN in the axilla. Of note, on 9/15, she was started treatment dose lovenox, to try and exclude septic pelvic thrombophlebitis as an etiology; also, OB service and MFM did not feel that patient's symptom were indicative of chorio, and did not feel an amniocentesis was warranted. Over the last  36 hours, the patient has actually been hypothermic so ICU/PCC was called for consult and since patient looked stable, she was okay to stay on the floor with close observation. Over the weekend, a ferritin level came back >7500 and ?Still's disease was part of the differential. Today, MFM, ID, hospitalist and [facility] felt, after talking to outpatient rheumatology (no in patient service available) that transfer to a tertiary care center would be best. Patient amenable to this, [facility] called, and they accepted the transfer.   Of note, HSV labs have not been done.  *Preterm: no issues *AMA: no issues; h/o low risk panorama *Prophylaxis: SCDs; see above *FEN/GI: regular diet. Saline lock IV *Dispo: transferred to [facility]  Consults:  MFM. Hospitalists. ID. Pulmonary critical care  Significant Diagnostic Studies:  9/15 ferritin >7500 Pending: surg path, acid fast smear and acid fast culture, fungal stain, 9/15 BCx x 2 (NGTD), babesia microti ab panel and  mono screen+ on 9/3  -neg covid, flu a & b, rsv, ucx, BCx x 2 (from 9/10), ebv pcr, ana, spotted fever igg/igm, ssa/ssb, acute hep panel, hiv, rpr, ebv igm (+igg), stool culture, c diff screen, tsh, RF, haptoglobin, anca, c4  Quantiferon: indeterminate, AM cortisol 30.6,  on 9/11    Latest Ref Rng & Units 10/04/2022    4:32 AM 10/03/2022    4:00 AM 10/02/2022    4:16 AM  CBC  WBC 4.0 - 10.5 K/uL 19.8  24.6  28.4   Hemoglobin 12.0 - 15.0 g/dL 9.4  9.1  9.0   Hematocrit 36.0 - 46.0 % 29.0  27.8  27.6   Platelets 150 - 400 K/uL 147  147  161  Latest Ref Rng & Units 10/04/2022    4:32 AM 10/03/2022    4:00 AM 10/02/2022    4:16 AM  CMP  Glucose 70 - 99 mg/dL 84  74  84   BUN 6 - 20 mg/dL 5  <5  <5   Creatinine 0.44 - 1.00 mg/dL 4.09  8.11  9.14   Sodium 135 - 145 mmol/L 130  129  129   Potassium 3.5 - 5.1 mmol/L 3.5  3.1  3.2   Chloride 98 - 111 mmol/L 102  102  103   CO2 22 - 32 mmol/L 19  19  19    Calcium 8.9 - 10.3 mg/dL 7.5   7.3  7.1   Total Protein 6.5 - 8.1 g/dL 4.3     Total Bilirubin 0.3 - 1.2 mg/dL 0.3     Alkaline Phos 38 - 126 U/L 77     AST 15 - 41 U/L 73     ALT 0 - 44 U/L 17     Treatments: s/p rocephin, vanc and cefepime; currently on bid lovenox  Discharge Exam: Blood pressure 101/60, pulse 95, temperature (!) 97.5 F (36.4 C), temperature source Oral, resp. rate 18, height 5\' 2"  (1.575 m), weight 58.1 kg, last menstrual period 03/23/2022, SpO2 100%. General appearance: alert Resp: clear to auscultation bilaterally Cardio: regular rate and rhythm, S1, S2 normal, no murmur, click, rub or gallop GI: gravid, nttp Skin: warm and dry; no rash on legs Neurologic: Grossly normal  Disposition:  [facility]  45 minutes spent discharge planning and the discharge process  Signed: [place] Bing 10/04/2022, 11:16 AM

## 2022-10-04 NOTE — Progress Notes (Signed)
NAME:  Melissa Little, MRN:  [id number], DOB:  [birth year], LOS: 6 ADMISSION DATE:  09/28/2022, CONSULTATION DATE: 10/03/2022 REFERRING MD: Milas Hock, MD, CHIEF COMPLAINT:   Fevers, hypothermia  History of Present Illness:   27-week pregnant lady with no significant past medical history presenting with chronic fever of unknown origin for the past 3 months.  She has had an extensive workup with negative cultures, negative hepatitis, HIV, Epstein-Barr, RSV, COVID, influenza, RMSF, RPR, C. difficile, group A strep.  QuantiFERON is indeterminate.  Imaging including chest x-ray and MRI are unremarkable  Underwent ultrasound-guided biopsy of the enlarged right axillary node on 9/13.  Autoimmune testing with ANA, complement levels are normal.  She was treated with few days of antibiotics for possible UTI which was discontinued by infectious disease as her workup was negative.  She was started on Lovenox at therapeutic dose for possible treatment of septic thrombophlebitis on 9/15.  Today she is hypothermic and PCCM consulted  Pertinent  Medical History    has a past medical history of Anemia.   Significant Hospital Events: Including procedures, antibiotic start and stop dates in addition to other pertinent events   9/10 admit 9/13 right axillary lymph node biopsy 9/15 PCCM consult for hypothermia  Interim History / Subjective:  PCCM asked to see patient again today as she has remained hypothermic, temp as low as 93.4, persistently hypomagnesemic.  Now up to 97.62F, she has no complaints  Objective   Blood pressure 101/60, pulse 95, temperature (!) 97.5 F (36.4 C), temperature source Oral, resp. rate 18, height 5\' 2"  (1.575 m), weight 58.1 kg, last menstrual period 03/23/2022, SpO2 100%.       No intake or output data in the 24 hours ending 10/04/22 1122  Filed Weights   09/28/22 1724  Weight: 58.1 kg     General:  well-nourished F resting in bed in no acute distress HEENT: MM  pink/moist, sclera anicteric Neuro: alert and oriented, appropriately conversational  CV: s1s2 rrr, no m/r/g PULM:  clear bilaterally on room air GI: soft, gravid Extremities: warm/dry, 2+ pedal edema  Skin: no rashes or lesions    Lab/imaging reviewed WBC improved from 24k to 19k, platelets stable at 147, Na 130, magnesium 1.5   Resolved Hospital Problem list     Assessment & Plan:    Chronic fevers of unknown origin Now hypothermic Symptoms have been going on since July with intermittent rash on her lower extremity Work up so far as noted above is negative She has been hypothermic intermittently for the last few days, but otherwise stable With high ferritin possibilities are adult onset stills. Macrophage activation syndrome is also consideration though less likely given that she does not have splenomegaly, LFT abnormalities or cytopenias Sarcoidosis is possible with borderline elevation of angiotensin-converting enzyme but there are no imaging abnormalities to suggest CMV PCR pending   -Follow CMV PCR -Follow lymph node biopsy results -Temperature improved after Bair hugger, she could not tolerate for very long without feeling sweaty, encourage use if temperature dropping again -Agree with transfer to [facility] -no current indication for transfer to the ICU, but please re-engage PCCM as needed   Best Practice (right click and "Reselect all SmartList Selections" daily)   Per primary team  Signature:   Darcella Gasman Rochella Benner, PA-C [facility] See Amion for pager If no response to pager , please call [phone] until 7pm After 7:00 pm call Elink  [phone]

## 2022-10-04 NOTE — Progress Notes (Signed)
OB Note Consultants recommend transfer to tertiary care center for further management. Patient amenable to transfer. Will contact [facility] re: transfer given potential for rheumatologic disorder  Cornelia Copa MD Attending [facility] [employer] (Faculty Practice) 10/04/2022 Time: [phone]

## 2022-10-04 NOTE — Progress Notes (Addendum)
Daily Antepartum Note  Admission Date: 09/28/2022 Current Date: 10/04/2022 8:52 AM  Melissa Little is a 37 y.o. [id number] at [redacted]w[redacted]d, admitted for FUO.  Pregnancy complicated by: Patient Active Problem List   Diagnosis Date Noted   [redacted] weeks gestation of pregnancy 10/03/2022   Sepsis (HCC) 09/29/2022   Pyelonephritis affecting pregnancy in second trimester 09/28/2022   Pyelonephritis 09/28/2022   Fever, unknown origin 09/28/2022   Anemia affecting pregnancy in second trimester 08/17/2022   Leukocytosis 08/17/2022   Supervision of high risk pregnancy, antepartum 08/05/2022   AMA (advanced maternal age) multigravida 35+ 08/05/2022   Anxiety 03/15/2022    Overnight/24hr events:  none  Subjective:  No OB s/s, such as contractions, leakage of fluid, VB or decreased fetal movement Patient states she feels "sweaty" but not feverish, per se; no chest pain, sob, nausea, vomiting, headache, visual changes   Objective:    Current Vital Signs 24h Vital Sign Ranges  T (!) 94.1 F (34.5 C) Temp  Avg: 97.2 F (36.2 C)  Min: 93.4 F (34.1 C)  Max: 99.7 F (37.6 C)  BP 101/60 BP  Min: 97/50  Max: 115/45  HR 95 Pulse  Avg: 109.8  Min: 91  Max: 123  RR 18 Resp  Avg: 19.6  Min: 18  Max: 21  SaO2 98 % Nasal Cannula SpO2  Avg: 96.5 %  Min: 92 %  Max: 100 %       24 Hour I/O Current Shift I/O  Time Ins Outs 09/15 0701 - 09/16 0700 In: 1100.9 [I.V.:1000.9] Out: -  No intake/output data recorded.   Patient Vitals for the past 24 hrs:  BP Temp Temp src Pulse Resp SpO2  10/04/22 0811 101/60 (!) 94.1 F (34.5 C) Axillary 95 18 --  10/04/22 0621 -- (!) 96.5 F (35.8 C) Axillary -- -- --  10/04/22 0349 (!) 107/52 98.1 F (36.7 C) Oral (!) 107 20 --  10/04/22 0248 -- 99.3 F (37.4 C) Oral -- -- --  10/04/22 0115 -- 99.7 F (37.6 C) Oral -- -- 98 %  10/04/22 0005 -- -- -- -- -- 94 %  10/03/22 2301 (!) 111/52 98.8 F (37.1 C) Oral (!) 110 20 97 %  10/03/22 2144 -- 97.7 F (36.5 C) Oral -- --  --  10/03/22 1923 (!) 97/50 97.7 F (36.5 C) Oral (!) 116 -- 98 %  10/03/22 1854 -- 97.8 F (36.6 C) Oral -- -- --  10/03/22 1700 -- -- -- -- -- 95 %  10/03/22 1600 -- -- -- -- -- 97 %  10/03/22 1528 (!) 111/48 99 F (37.2 C) Oral (!) 123 18 98 %  10/03/22 1400 (!) 115/45 98.6 F (37 C) Oral (!) 122 20 96 %  10/03/22 1300 -- -- -- -- -- 96 %  10/03/22 1213 -- -- -- -- -- 96 %  10/03/22 1131 (!) 114/51 97.6 F (36.4 C) Oral (!) 114 20 97 %  10/03/22 1040 -- -- -- -- -- 92 %  10/03/22 1030 -- (!) 94 F (34.4 C) Oral -- -- --  10/03/22 0923 -- (!) 95.7 F (35.4 C) Rectal -- -- --  10/03/22 0915 -- (!) 93.4 F (34.1 C) Axillary -- -- --  10/03/22 0910 108/64 -- -- 91 (!) 21 100 %   Fetal Heart Tones: pending Tocometry: pending  Physical exam: General: Well nourished, well developed female in no acute distress. Abdomen: gravid nttp Cardiovascular: S1, S2 normal, no murmur, rub or gallop, regular rate  and rhythm Respiratory: CTAB Extremities: no clubbing, cyanosis or edema Skin: Warm and dry. No rash on legs  Medications: Current Facility-Administered Medications  Medication Dose Route Frequency Provider Last Rate Last Admin   0.9 %  sodium chloride infusion   Intravenous Continuous Reva Bores, MD 125 mL/hr at 10/02/22 1729 New Bag at 10/02/22 1729   acetaminophen (TYLENOL) tablet 650 mg  650 mg Oral Q6H PRN Constant, Peggy, MD   650 mg at 10/04/22 0144   calcium carbonate (TUMS - dosed in mg elemental calcium) chewable tablet 400 mg of elemental calcium  2 tablet Oral Q4H PRN Reva Bores, MD   400 mg of elemental calcium at 09/29/22 2211   cyclobenzaprine (FLEXERIL) tablet 5 mg  5 mg Oral TID PRN Milas Hock, MD       docusate sodium (COLACE) capsule 100 mg  100 mg Oral Daily Reva Bores, MD   100 mg at 09/29/22 0937   enoxaparin (LOVENOX) injection 60 mg  60 mg Subcutaneous Q12H Milas Hock, MD   60 mg at 10/03/22 2256   lactated ringers infusion   Intravenous  Continuous Myna Hidalgo, DO   Stopped at 10/03/22 0043   ondansetron (ZOFRAN) injection 4 mg  4 mg Intravenous Q6H PRN Milas Hock, MD   4 mg at 10/03/22 2220   potassium chloride SA (KLOR-CON M) CR tablet 40 mEq  40 mEq Oral BID Milas Hock, MD   40 mEq at 10/03/22 2252   prenatal multivitamin tablet 1 tablet  1 tablet Oral Q1200 Reva Bores, MD   1 tablet at 10/03/22 1421   promethazine (PHENERGAN) 25 mg in sodium chloride 0.9 % 50 mL IVPB  25 mg Intravenous Q6H PRN Constant, Peggy, MD 200 mL/hr at 10/02/22 1638 25 mg at 10/02/22 1638   zolpidem (AMBIEN) tablet 5 mg  5 mg Oral QHS PRN Reva Bores, MD        Labs:  In process: CMV dna quant pcr, repeat BCx x 2 (NGTD), stool culture, babesia microti ab panel Recent Labs  Lab 10/02/22 0416 10/03/22 0400 10/04/22 0432  WBC 28.4* 24.6* 19.8*  HGB 9.0* 9.1* 9.4*  HCT 27.6* 27.8* 29.0*  PLT 161 147* 147*    Recent Labs  Lab 09/28/22 1929 09/29/22 0408 09/30/22 0530 10/02/22 0416 10/03/22 0400 10/04/22 0432  NA 125* 126*   < > 129* 129* 130*  K 2.3* 3.0*   < > 3.2* 3.1* 3.5  CL 91* 96*   < > 103 102 102  CO2 23 22   < > 19* 19* 19*  BUN 5* <5*   < > <5* <5* 5*  CREATININE 0.53 0.53   < > 0.46 0.51 0.49  CALCIUM 7.8* 7.4*   < > 7.1* 7.3* 7.5*  PROT 6.1* 5.4*  --   --   --  4.3*  BILITOT 0.6 0.4  --   --   --  0.3  ALKPHOS 144* 145*  --   --   --  77  ALT 11 10  --   --   --  17  AST 40 35  --   --   --  73*  GLUCOSE 79 85   < > 84 74 84   < > = values in this interval not displayed.   Radiology:  No new imaging  Assessment & Plan:  Patient stable *Pregnancy: fetal status reassuring *FUO: [facility], ID, MFM following. Slightly elevated AST at 73  but no e/o pre-eclampsia. Consultants to touch base with rheum today. Continue treatment dose lovenox to treat for possible septic pelvic thrombophlebitis.  -9/13 IR FNA biopsy and labs still pending *Hypothermia: seen by PCCM yesterday for this and since  patient stable, ICU intervention not indicated. Pt declines baier hugger. Continue to follow and will ask if they can see her today for continuing care *Skin: No rash today. She states that the rash can be all over her body, only started this pregnancy, only lasts for a few days and then goes away. It is not pruritic or painful.  *Pulm: I took off her [place] which was at 2 and she stayed at 100% saturation and no issues with breathing and she stated that she felt fine. Will continue to leave off.  *Preterm: no issues *PPx: see above *FEN/GI: regular diet, saline lock IV *Dispo: per consultants  Cornelia Copa MD Attending [facility] GYN Consult Phone: [phone] (M-F, [phone]) & [phone]  (Off hours, weekends, holidays)

## 2022-10-05 LAB — STOOL CULTURE: E coli, Shiga toxin Assay: NEGATIVE

## 2022-10-05 LAB — STOOL CULTURE REFLEX - CMPCXR

## 2022-10-05 LAB — STOOL CULTURE REFLEX - RSASHR

## 2022-10-05 LAB — CMV DNA, QUANTITATIVE, PCR
CMV DNA Quant: POSITIVE [IU]/mL
Log10 CMV Qn DNA Pl: UNDETERMINED {Log_IU}/mL

## 2022-10-08 LAB — CULTURE, BLOOD (ROUTINE X 2)
Culture: NO GROWTH
Culture: NO GROWTH

## 2022-10-08 LAB — ACID FAST SMEAR (AFB, MYCOBACTERIA): Acid Fast Smear: NEGATIVE

## 2022-10-11 DIAGNOSIS — M061 Adult-onset Still's disease: Secondary | ICD-10-CM | POA: Insufficient documentation

## 2022-10-26 ENCOUNTER — Telehealth: Payer: Self-pay | Admitting: Family Medicine

## 2022-10-26 NOTE — Telephone Encounter (Signed)
I called the patient to get her scheduled for her prenatal care, and patient informed me that she was referred to [facility].

## 2022-11-03 LAB — FUNGAL ORGANISM REFLEX

## 2022-11-03 LAB — FUNGUS CULTURE WITH STAIN

## 2022-11-03 LAB — FUNGUS CULTURE RESULT

## 2022-11-09 ENCOUNTER — Ambulatory Visit: Payer: Medicaid Other | Attending: Maternal & Fetal Medicine

## 2022-11-11 ENCOUNTER — Encounter: Payer: Self-pay | Admitting: Obstetrics and Gynecology

## 2022-11-17 ENCOUNTER — Telehealth: Payer: Self-pay | Admitting: Family Medicine

## 2022-11-17 NOTE — Telephone Encounter (Signed)
I received a message through the in basket from Dr. Para March to reach out to the patient to confirm that she is getting care. I reached out to the patient and she said yes she is getting her prenatal care in [place].

## 2022-11-18 LAB — ACID FAST CULTURE WITH REFLEXED SENSITIVITIES (MYCOBACTERIA): Acid Fast Culture: NEGATIVE

## 2022-12-01 ENCOUNTER — Inpatient Hospital Stay (HOSPITAL_COMMUNITY)
Admission: AD | Admit: 2022-12-01 | Discharge: 2022-12-04 | DRG: 797 | Disposition: A | Discharge status: Home / Self Care | Payer: Medicaid Other | Attending: Obstetrics and Gynecology | Admitting: Obstetrics and Gynecology

## 2022-12-01 ENCOUNTER — Other Ambulatory Visit: Payer: Self-pay

## 2022-12-01 ENCOUNTER — Inpatient Hospital Stay (HOSPITAL_COMMUNITY): Payer: Medicaid Other

## 2022-12-01 ENCOUNTER — Encounter (HOSPITAL_COMMUNITY): Payer: Self-pay | Admitting: Obstetrics and Gynecology

## 2022-12-01 DIAGNOSIS — O0990 Supervision of high risk pregnancy, unspecified, unspecified trimester: Secondary | ICD-10-CM

## 2022-12-01 DIAGNOSIS — M061 Adult-onset Still's disease: Secondary | ICD-10-CM | POA: Diagnosis present

## 2022-12-01 DIAGNOSIS — O9902 Anemia complicating childbirth: Secondary | ICD-10-CM | POA: Diagnosis present

## 2022-12-01 DIAGNOSIS — O35BXX0 Maternal care for other (suspected) fetal abnormality and damage, fetal cardiac anomalies, not applicable or unspecified: Secondary | ICD-10-CM | POA: Diagnosis not present

## 2022-12-01 DIAGNOSIS — O4590 Premature separation of placenta, unspecified, unspecified trimester: Secondary | ICD-10-CM | POA: Diagnosis present

## 2022-12-01 DIAGNOSIS — O4693 Antepartum hemorrhage, unspecified, third trimester: Secondary | ICD-10-CM

## 2022-12-01 DIAGNOSIS — O09523 Supervision of elderly multigravida, third trimester: Secondary | ICD-10-CM

## 2022-12-01 DIAGNOSIS — O99892 Other specified diseases and conditions complicating childbirth: Secondary | ICD-10-CM | POA: Diagnosis present

## 2022-12-01 DIAGNOSIS — L02416 Cutaneous abscess of left lower limb: Secondary | ICD-10-CM | POA: Diagnosis present

## 2022-12-01 DIAGNOSIS — Z801 Family history of malignant neoplasm of trachea, bronchus and lung: Secondary | ICD-10-CM

## 2022-12-01 DIAGNOSIS — Z825 Family history of asthma and other chronic lower respiratory diseases: Secondary | ICD-10-CM | POA: Diagnosis not present

## 2022-12-01 DIAGNOSIS — O4593 Premature separation of placenta, unspecified, third trimester: Principal | ICD-10-CM | POA: Diagnosis present

## 2022-12-01 DIAGNOSIS — O4703 False labor before 37 completed weeks of gestation, third trimester: Secondary | ICD-10-CM

## 2022-12-01 DIAGNOSIS — O9972 Diseases of the skin and subcutaneous tissue complicating childbirth: Secondary | ICD-10-CM | POA: Diagnosis present

## 2022-12-01 DIAGNOSIS — Z302 Encounter for sterilization: Secondary | ICD-10-CM

## 2022-12-01 DIAGNOSIS — Z3A35 35 weeks gestation of pregnancy: Secondary | ICD-10-CM | POA: Diagnosis not present

## 2022-12-01 DIAGNOSIS — L0291 Cutaneous abscess, unspecified: Secondary | ICD-10-CM | POA: Insufficient documentation

## 2022-12-01 DIAGNOSIS — O99344 Other mental disorders complicating childbirth: Secondary | ICD-10-CM | POA: Diagnosis not present

## 2022-12-01 DIAGNOSIS — N939 Abnormal uterine and vaginal bleeding, unspecified: Secondary | ICD-10-CM

## 2022-12-01 HISTORY — DX: Rheumatoid arthritis, unspecified: M06.9

## 2022-12-01 HISTORY — DX: Juvenile rheumatoid arthritis with systemic onset, unspecified site: M08.20

## 2022-12-01 LAB — CBC
HCT: 36.7 % (ref 36.0–46.0)
Hemoglobin: 12.5 g/dL (ref 12.0–15.0)
MCH: 29.5 pg (ref 26.0–34.0)
MCHC: 34.1 g/dL (ref 30.0–36.0)
MCV: 86.6 fL (ref 80.0–100.0)
Platelets: 191 10*3/uL (ref 150–400)
RBC: 4.24 MIL/uL (ref 3.87–5.11)
RDW: 18.3 % — ABNORMAL HIGH (ref 11.5–15.5)
WBC: 12 10*3/uL — ABNORMAL HIGH (ref 4.0–10.5)
nRBC: 0 % (ref 0.0–0.2)

## 2022-12-01 LAB — URINALYSIS, ROUTINE W REFLEX MICROSCOPIC
Bilirubin Urine: NEGATIVE
Glucose, UA: NEGATIVE mg/dL
Ketones, ur: NEGATIVE mg/dL
Nitrite: NEGATIVE
Protein, ur: 30 mg/dL — AB
Specific Gravity, Urine: 1.016 (ref 1.005–1.030)
pH: 6 (ref 5.0–8.0)

## 2022-12-01 LAB — ABO/RH: ABO/RH(D): B POS

## 2022-12-01 LAB — PREPARE RBC (CROSSMATCH)

## 2022-12-01 MED ORDER — CALCIUM CARBONATE ANTACID 500 MG PO CHEW: 2.0000 | CHEWABLE_TABLET | ORAL | Status: DC | PRN

## 2022-12-01 MED ORDER — DOCUSATE SODIUM 100 MG PO CAPS: 100.0000 mg | ORAL_CAPSULE | Freq: Every day | ORAL | Status: DC

## 2022-12-01 MED ORDER — TERBUTALINE SULFATE 1 MG/ML IJ SOLN
0.2500 mg | Freq: Once | INTRAMUSCULAR | Status: AC
  Administered 2022-12-01: 0.25 mg via SUBCUTANEOUS
  Filled 2022-12-01: qty 1

## 2022-12-01 MED ORDER — FOLIC ACID 1 MG PO TABS
500.0000 ug | ORAL_TABLET | Freq: Every day | ORAL | Status: DC
  Administered 2022-12-02 – 2022-12-04 (×3): 0.5 mg via ORAL
  Filled 2022-12-01 (×2): qty 0.5
  Filled 2022-12-01: qty 1
  Filled 2022-12-01: qty 0.5
  Filled 2022-12-01: qty 1

## 2022-12-01 MED ORDER — ANAKINRA 100 MG/0.67ML ~~LOC~~ SOSY
100.0000 mg | PREFILLED_SYRINGE | Freq: Every morning | SUBCUTANEOUS | Status: DC
  Filled 2022-12-01 (×2): qty 0.67

## 2022-12-01 MED ORDER — ACETAMINOPHEN 325 MG PO TABS
650.0000 mg | ORAL_TABLET | ORAL | Status: DC | PRN
  Administered 2022-12-01: 650 mg via ORAL
  Filled 2022-12-01: qty 2

## 2022-12-01 MED ORDER — PREDNISONE 10 MG PO TABS
10.0000 mg | ORAL_TABLET | Freq: Every day | ORAL | Status: DC
  Administered 2022-12-02 – 2022-12-04 (×3): 10 mg via ORAL
  Filled 2022-12-01 (×4): qty 1

## 2022-12-01 MED ORDER — LACTATED RINGERS IV SOLN
125.0000 mL/h | INTRAVENOUS | Status: AC
  Administered 2022-12-02: 125 mL/h via INTRAVENOUS

## 2022-12-01 MED ORDER — PRENATAL MULTIVITAMIN CH
1.0000 | ORAL_TABLET | Freq: Every day | ORAL | Status: DC
Start: 2022-12-02 — End: 2022-12-02

## 2022-12-01 MED ORDER — FENTANYL CITRATE (PF) 100 MCG/2ML IJ SOLN
50.0000 ug | Freq: Once | INTRAMUSCULAR | Status: AC
  Administered 2022-12-01: 50 ug via INTRAVENOUS
  Filled 2022-12-01: qty 2

## 2022-12-01 NOTE — H&P (Signed)
FACULTY PRACTICE ANTEPARTUM ADMISSION HISTORY AND PHYSICAL NOTE   History of Present Illness: Melissa Little is a 37 y.o. [id number] at [redacted]w[redacted]d admitted for vaginal bleeding due to small placental abruption.    Patient reports spotting that started last night. This morning she woke up and had completed saturated a pad. Was passing clots. Has saturated 2 additional pads throughout the day today. Has associated cramping but notes that it has spaced out throughout the day and is now just occasional. Is feeling fetal movement but less than usual   Fetal presentation is cephalic.  In MAU, patient has been hemodynamically stable & non-tachycardic. On exam, a small amount of blood was cleared from the vault (~25cc). She was not actively bleeding but there was a trickle of blood from the cervical os with valsalva. Cervix visually closed.  Her Hgb is reassuring at 12.5.  Tracing has been reactive and reassuring.  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 She is taking anakinra (last dose today 11:30a), prednisone 10mg  daily, and folate. She has had 3 prior vaginal deliveries. She desires permanent sterilization.   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   Anemia affecting pregnancy in second trimester 08/17/2022   Supervision of high risk pregnancy, antepartum 08/05/2022   AMA (advanced maternal age) multigravida 35+ 08/05/2022   Anxiety 03/15/2022    Past Medical History:  Diagnosis Date   Anemia    Rheumatoid arthritis (HCC)    Still's disease (HCC)     Past Surgical History:  Procedure Laterality Date   IR US LIVER BIOPSY  10/01/2022   NO PAST SURGERIES      OB History  Gravida Para Term Preterm AB Living  6 4 4  0 1 4  SAB IAB Ectopic Multiple Live Births  1 0 0 0 4    # Outcome  Date GA Lbr Len/2nd Weight Sex Type Anes PTL Lv  6 Current           5 SAB 01/2021          4 Term 05/03/16 [redacted]w[redacted]d  3232 g M Vag-Spont EPI  LIV  3 Term 02/07/14   3345 g M Vag-Spont   LIV  2 Term 03/05/12 [redacted]w[redacted]d  2863 g M Vag-Spont EPI  LIV  1 Term 12/06/02 [redacted]w[redacted]d  2807 g F Vag-Spont EPI  LIV    Social History   Socioeconomic History   Marital status: Single    Spouse name: Not on file   Number of children: Not on file   Years of education: Not on file   Highest education level: Not on file  Occupational History   Not on file  Tobacco Use   Smoking status: Never   Smokeless tobacco: Never  Vaping Use   Vaping status: Never Used  Substance and Sexual Activity   Alcohol use: Not Currently    Comment: occasional   Drug use: Not Currently    Types: Marijuana   Sexual activity: Not Currently    Birth control/protection: None  Other Topics Concern   Not on file  Social History Narrative   Not on file   Social Determinants of Health   Financial Resource Strain: Low Risk  (10/26/2022)   Received from [facility]   Overall Financial Resource Strain (CARDIA)  Difficulty of Paying Living Expenses: Not hard at all  Food Insecurity: No Food Insecurity (12/01/2022)   Hunger Vital Sign    Worried About Running Out of Food in the Last Year: Never true    Ran Out of Food in the Last Year: Never true  Transportation Needs: No Transportation Needs (12/01/2022)   PRAPARE - Administrator, Civil Service (Medical): No    Lack of Transportation (Non-Medical): No  Physical Activity: Inactive (10/26/2022)   Received from [facility]   Exercise Vital Sign    Days of Exercise per Week: 0 days    Minutes of Exercise per Session: 0 min  Stress: Stress Concern Present (10/26/2022)   Received from [facility] - Occupational Stress Questionnaire    Feeling of Stress : To some extent   Social Connections: Moderately Isolated (10/26/2022)   Received from [facility]   Social Connection and Isolation Panel [NHANES]    Frequency of Communication with Friends and Family: More than three times a week    Frequency of Social Gatherings with Friends and Family: Once a week    Attends Religious Services: More than 4 times per year    Active Member of [employer] or Organizations: No    Attends Engineer, structural: Never    Marital Status: Never married    Family History  Problem Relation Age of Onset   COPD Father    Lung cancer Paternal Grandfather     No Known Allergies  Medications Prior to Admission  Medication Sig Dispense Refill Last Dose   anakinra Reita Cliche) 100 MG/0.67ML SOSY injection Inject into the vein.   12/01/2022 at 1130a   folic acid (FOLVITE) 400 MCG tablet Take 400 mcg by mouth daily.   12/01/2022 at 1130a   predniSONE (DELTASONE) 10 MG tablet Take 10 mg by mouth daily with breakfast.   12/01/2022 at 1130a    Review of Systems - Negative except as noted in HPI  Vitals:  BP 138/74 (BP Location: Right Arm)   Pulse 78   Temp 98.4 F (36.9 C) (Oral)   Resp 17   LMP 03/23/2022   SpO2 100%  Physical Examination: CONSTITUTIONAL: Well-developed, well-nourished female in no acute distress.  CARDIOVASCULAR: Normal heart rate noted RESPIRATORY: Effort normal, no problems with respiration noted ABDOMEN: Soft, nontender, nondistended, gravid.  Cervix: visually closed by Raelyn Mora CNM Membranes:intact Fetal Monitoring: 140bpm, moderate variability, +accels, no decels Tocometer: Contractions q5-64min  Labs:  Results for orders placed or performed during the hospital encounter of 12/01/22 (from the past 24 hour(s))  Urinalysis, Routine w reflex microscopic -Urine, Clean Catch   Collection Time: 12/01/22 12:57 PM  Result Value Ref Range   Color, Urine AMBER (A) YELLOW   APPearance CLOUDY (A) CLEAR   Specific Gravity, Urine  1.016 1.005 - 1.030   pH 6.0 5.0 - 8.0   Glucose, UA NEGATIVE NEGATIVE mg/dL   Hgb urine dipstick LARGE (A) NEGATIVE   Bilirubin Urine NEGATIVE NEGATIVE   Ketones, ur NEGATIVE NEGATIVE mg/dL   Protein, ur 30 (A) NEGATIVE mg/dL   Nitrite NEGATIVE NEGATIVE   Leukocytes,Ua MODERATE (A) NEGATIVE   RBC / HPF 11-20 0 - 5 RBC/hpf   WBC, UA 11-20 0 - 5 WBC/hpf   Bacteria, UA FEW (A) NONE SEEN   Squamous Epithelial / HPF 11-20 0 - 5 /HPF   Mucus PRESENT    Trichomonas, UA PRESENT (  A) NONE SEEN  Type and screen   Collection Time: 12/01/22  3:33 PM  Result Value Ref Range   ABO/RH(D) B POS    Antibody Screen NEG    Sample Expiration      12/04/2022,2359 Performed at [facility], [address], [place], [place] [address]   CBC   Collection Time: 12/01/22  3:36 PM  Result Value Ref Range   WBC 12.0 (H) 4.0 - 10.5 K/uL   RBC 4.24 3.87 - 5.11 MIL/uL   Hemoglobin 12.5 12.0 - 15.0 g/dL   HCT 91.4 78.2 - 95.6 %   MCV 86.6 80.0 - 100.0 fL   MCH 29.5 26.0 - 34.0 pg   MCHC 34.1 30.0 - 36.0 g/dL   RDW 21.3 (H) 08.6 - 57.8 %   Platelets 191 150 - 400 K/uL   nRBC 0.0 0.0 - 0.2 %    Imaging Studies: [place] MFM OB LIMITED  Result Date: 12/01/2022 ----------------------------------------------------------------------  OBSTETRICS REPORT                       (Signed Final 12/01/2022 03:21 pm) ---------------------------------------------------------------------- Patient Info  ID #:       [id number]                          D.O.B.:  Apr 15, 1985 (36 yrs)  Name:       Melissa Little                    Visit Date: 12/01/2022 02:35 pm ---------------------------------------------------------------------- Performed By  Attending:        Noralee Space MD        Ref. Address:     [address]                                                             [place], [place]                                                             [address]  Performed By:     Percell Boston          Secondary Phy.:   [facility] MAU/Triage                     RDMS  Referred By:      Reva Bores          [facility] ---------------------------------------------------------------------- Orders  #  Description                           Code  Ordered By  1  [place] MFM OB LIMITED                     [id number]    Raelyn Mora  2  [place] MFM FETAL BPP WO NON               [id number]    ROLITTA DAWSON     STRESS ----------------------------------------------------------------------  #  Order #                     Accession #                Episode #  1  [id number]                   [id number]                 [id number]  2  [id number]                   [id number]                 [id number] ---------------------------------------------------------------------- Indications  Vaginal bleeding in pregnancy, third trimester O46.93  Preterm contractions                           O47.00  Advanced maternal age primigravida 20+,        O59.512  second trimester  Echogenic intracardiac focus of the heart      O35.8XX0  (EIF)  LR NIPS, Neg horizon  [redacted] weeks gestation of pregnancy                Z3A.35 ---------------------------------------------------------------------- Fetal Evaluation  Num Of Fetuses:         1  Fetal Heart Rate(bpm):  150  Cardiac Activity:       Observed  Presentation:           Cephalic  Placenta:               Posterior Fundal  P. Cord Insertion:      Visualized, central  Amniotic Fluid  AFI FV:      Within normal limits  AFI Sum(cm)     %Tile       Largest Pocket(cm)  10.3            24          3.6  RUQ(cm)       RLQ(cm)       LUQ(cm)        LLQ(cm)  1.8           3.3           3.6            1.6  Comment:    Small abruption noted on anterior edge of placenta 2.0 x 1.4x 3.0              cm. ---------------------------------------------------------------------- Biophysical Evaluation  Amniotic F.V:   Within normal limits       F. Tone:        Observed  F. Movement:     Observed                   Score:          8/8  F. Breathing:   Observed ---------------------------------------------------------------------- OB History  Gravidity:    6         Term:  4        Prem:   0        SAB:   1  TOP:          0       Ectopic:  0        Living: 4 ---------------------------------------------------------------------- Gestational Age  LMP:           36w 1d        Date:  03/23/22                   EDD:   12/28/22  Best:          Consuello Closs 5d     Det. By:  Marcella Dubs         EDD:   12/31/22                                      (06/30/22) ---------------------------------------------------------------------- Anatomy  Cranium:               Appears normal         Stomach:                Appears normal, left                                                                        sided  Thoracic:              Appears normal         Kidneys:                Appear normal  Diaphragm:             Appears normal         Bladder:                Appears normal ---------------------------------------------------------------------- Cervix Uterus Adnexa  Cervix  Not visualized (advanced GA >24wks)  Uterus  No abnormality visualized.  Right Ovary  Not visualized.  Left Ovary  Not visualized.  Cul De Sac  No free fluid seen.  Adnexa  No abnormality visualized ---------------------------------------------------------------------- Impression  Patient presented to MAU with complaints of vaginal  bleeding.  She reports she passed clots this morning.  Amniotic fluid is normal good fetal activity seen.  Cephalic  presentation.  Antenatal testing is reassuring.  BPP 8/8.  A  small hematoma is seen at the inferior edge of the placenta  (measurements above).  Impression: Placental abruption.  Discussed with MAU team. ---------------------------------------------------------------------- Recommendations  -Recommend inpatient observation for 48 hours.  -Informed no further bleeding occurs, delivery may be  considered at  [redacted] weeks gestation. ----------------------------------------------------------------------                  Noralee Space, MD Electronically Signed Final Report   12/01/2022 03:21 pm ----------------------------------------------------------------------   [place] MFM FETAL BPP WO NON STRESS  Result Date: 12/01/2022 ----------------------------------------------------------------------  OBSTETRICS REPORT                       (Signed Final 12/01/2022 03:21 pm) ---------------------------------------------------------------------- Patient Info  ID #:  [id number]                          D.O.B.:  01/29/1985 (36 yrs)  Name:       Melissa Little                    Visit Date: 12/01/2022 02:35 pm ---------------------------------------------------------------------- Performed By  Attending:        Noralee Space MD        Ref. Address:     [address]                                                             [place], [place]                                                             [address]  Performed By:     Percell Boston          Secondary Phy.:   [facility] MAU/Triage                    RDMS  Referred By:      Reva Bores          [facility] ---------------------------------------------------------------------- Orders  #  Description                           Code        Ordered By  1  [place] MFM OB LIMITED                     76815.01    ROLITTA DAWSON  2  [place] MFM FETAL BPP WO NON               76819.01    ROLITTA DAWSON     STRESS ----------------------------------------------------------------------  #  Order #                     Accession #                Episode #  1  [id number]                   [id number]                 [id number]  2  [id number]                   [id number]                 [id number] ---------------------------------------------------------------------- Indications  Vaginal bleeding in pregnancy, third trimester  O46.93  Preterm contractions                           O47.00  Advanced maternal age primigravida 87+,        O28.512  second trimester  Echogenic intracardiac focus of the heart      O35.8XX0  (EIF)  LR NIPS, Neg horizon  [redacted] weeks gestation of pregnancy                Z3A.35 ---------------------------------------------------------------------- Fetal Evaluation  Num Of Fetuses:         1  Fetal Heart Rate(bpm):  150  Cardiac Activity:       Observed  Presentation:           Cephalic  Placenta:               Posterior Fundal  P. Cord Insertion:      Visualized, central  Amniotic Fluid  AFI FV:      Within normal limits  AFI Sum(cm)     %Tile       Largest Pocket(cm)  10.3            24          3.6  RUQ(cm)       RLQ(cm)       LUQ(cm)        LLQ(cm)  1.8           3.3           3.6            1.6  Comment:    Small abruption noted on anterior edge of placenta 2.0 x 1.4x 3.0              cm. ---------------------------------------------------------------------- Biophysical Evaluation  Amniotic F.V:   Within normal limits       F. Tone:        Observed  F. Movement:    Observed                   Score:          8/8  F. Breathing:   Observed ---------------------------------------------------------------------- OB History  Gravidity:    6         Term:   4        Prem:   0        SAB:   1  TOP:          0       Ectopic:  0        Living: 4 ---------------------------------------------------------------------- Gestational Age  LMP:           36w 1d        Date:  03/23/22                   EDD:   12/28/22  Best:          35w 5d     Det. ByMarcella Dubs         EDD:   12/31/22                                      (06/30/22) ---------------------------------------------------------------------- Anatomy  Cranium:               Appears normal         Stomach:  Appears normal, left                                                                        sided  Thoracic:              Appears normal          Kidneys:                Appear normal  Diaphragm:             Appears normal         Bladder:                Appears normal ---------------------------------------------------------------------- Cervix Uterus Adnexa  Cervix  Not visualized (advanced GA >24wks)  Uterus  No abnormality visualized.  Right Ovary  Not visualized.  Left Ovary  Not visualized.  Cul De Sac  No free fluid seen.  Adnexa  No abnormality visualized ---------------------------------------------------------------------- Impression  Patient presented to MAU with complaints of vaginal  bleeding.  She reports she passed clots this morning.  Amniotic fluid is normal good fetal activity seen.  Cephalic  presentation.  Antenatal testing is reassuring.  BPP 8/8.  A  small hematoma is seen at the inferior edge of the placenta  (measurements above).  Impression: Placental abruption.  Discussed with MAU team. ---------------------------------------------------------------------- Recommendations  -Recommend inpatient observation for 48 hours.  -Informed no further bleeding occurs, delivery may be  considered at [redacted] weeks gestation. 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 Maternal and fetal status currently reassuring - appropriate for expectant management for now - Continuous EFM/toco & clears overnight. If patient proves stability will deescalate -  T&C x 2u, PIV, trend Hgb daily - Discussed delivery by 37-38wk if she remains stable, but indication for sooner delivery if recurrent heavy bleeding, non-reassuring fetal testing, etc.  - Discussed potential cesarean delivery if life threatening heavy bleeding or fetal bradycardia. The risks of surgery were discussed with the patient including but were not limited to: bleeding which may require transfusion, reoperation, or additional procedures like hysterectomy in the event of life-threatening hemorrhage; infection which may require antibiotics; injury to bowel, bladder, ureters or other surrounding organs; injury to the fetus; formation of adhesions; placental abnormalities with subsequent pregnancies; incisional problems; thromboembolic phenomenon and other postoperative/anesthesia complications. Written consent obtained, consent on patient chart  #Sterilization request - She desires permanent sterilization. Discussed alternatives including LARC options (reversible & equally effective) and vasectomy. She declines these options.  - Discussed surgery of salpingectomy OR tubal ligation if salpingectomy is not feasible. This could be done at time of cesarean section or following vaginal delivery  - Risks of surgery include but are not limited  to: bleeding, infection, injury to surrounding organs/tissues (i.e. bowel/bladder/ureters), need for additional procedures, wound complications, hospital re-admission, and conversion to open surgery, VTE. We reviewed risk of contraceptive failure and risk of regret.  - Medicaid tubal papers signed 11/13, tubal sterilization on CS consent  #Still's disease - Continue home anakinra, prednisone & folate  #Routine - Third trimester HIV, RPR & GBS ordered - SCDS for VTE ppx  Dispo: admit to antepartum   Harvie Bridge, MD Obstetrician & Gynecologist, Faculty Practice Faculty Practice, [facility]

## 2022-12-01 NOTE — Plan of Care (Signed)
  Problem: Education: Goal: Knowledge of General Education information will improve Description: Including pain rating scale, medication(s)/side effects and non-pharmacologic comfort measures Outcome: Completed/Met

## 2022-12-01 NOTE — MAU Note (Addendum)
.  Melissa Little is a 37 y.o. at [redacted]w[redacted]d here in MAU reporting: she began having contractions every 11 minutes last night along with spotting. She continued having contractions into this morning, but also started bleeding heavily with large clots. Reports saturating a pad in 20 minutes. She has also had DFM since yesterday. Denies abnormal discharge or LOF.   She reports that she has been getting her [facility] care at [facility] after being referred to their Rheumatology department earlier in her pregnancy for new onset symptoms. She was diagnosed with Still's disease. She reports that her last [place] at [facility] was on the 11th and was normal.   RN notes blood on perineum after patient got into the bed.  LMP: N/A Onset of complaint: Last night Pain score: 8/10 Vitals:   12/01/22 1250  BP: 137/76  Pulse: 89  Resp: 16  Temp: 98.5 F (36.9 C)  SpO2: 100%     FHT:145 Lab orders placed from triage:  UA

## 2022-12-01 NOTE — MAU Provider Note (Signed)
History     CSN: [id number]  Arrival date and time: 12/01/22 1229   Event Date/Time   First Provider Initiated Contact with Patient 12/01/22 1408      Chief Complaint  Patient presents with   Contractions   Vaginal Bleeding   Decreased Fetal Movement   HPI Ms. Melissa Little is a 37 y.o. year old [id number] female at [redacted]w[redacted]d weeks gestation who presents to MAU reporting since last night with vaginal spotting.  This morning she woke up the bleeding was heavy with large clots.  She reports bleeding with cramping in the center.  Sanitary pad in 20 minutes.  She has had 2-3 testing today and she also complains of DFM since yesterday. She receives [facility] with [facility] since being diagnosed with Still's Disease; next appt is 12/06/2022.   OB History     Gravida  6   Para  4   Term  4   Preterm  0   AB  1   Living  4      SAB  1   IAB  0   Ectopic  0   Multiple  0   Live Births  4           Past Medical History:  Diagnosis Date   Anemia    Rheumatoid arthritis (HCC)    Still's disease (HCC)     Past Surgical History:  Procedure Laterality Date   IR US LIVER BIOPSY  10/01/2022   NO PAST SURGERIES      Family History  Problem Relation Age of Onset   COPD Father    Lung cancer Paternal Grandfather     Social History   Tobacco Use   Smoking status: Never   Smokeless tobacco: Never  Vaping Use   Vaping status: Never Used  Substance Use Topics   Alcohol use: Not Currently    Comment: occasional   Drug use: Not Currently    Types: Marijuana    Allergies: No Known Allergies  Medications Prior to Admission  Medication Sig Dispense Refill Last Dose   anakinra (KINERET) 100 MG/0.67ML SOSY injection Inject into the vein.   12/01/2022 at 1130a   folic acid (FOLVITE) 400 MCG tablet Take 400 mcg by mouth daily.   12/01/2022 at 1130a   predniSONE (DELTASONE) 10 MG tablet Take 10 mg by mouth daily with breakfast.   12/01/2022 at 1130a    Review of Systems   Constitutional: Negative.   HENT: Negative.    Eyes: Negative.   Respiratory: Negative.    Cardiovascular: Negative.   Gastrointestinal: Negative.   Endocrine: Negative.   Genitourinary:  Positive for pelvic pain and vaginal bleeding (heavy with clots since waking up this morning).  Musculoskeletal: Negative.   Skin: Negative.   Allergic/Immunologic: Negative.   Neurological: Negative.   Hematological: Negative.   Psychiatric/Behavioral: Negative.     Physical Exam   Blood pressure 137/76, pulse 89, temperature 98.5 F (36.9 C), temperature source Oral, resp. rate 16, last menstrual period 03/23/2022, SpO2 100%.  Physical Exam Vitals and nursing note reviewed. Exam conducted with a chaperone present.  Constitutional:      Appearance: Normal appearance. She is normal weight.  Pulmonary:     Effort: Pulmonary effort is normal.  Abdominal:     Palpations: Abdomen is soft.  Genitourinary:    General: Normal vulva.     Comments: Pelvic exam: External genitalia normal, SE: vaginal walls pink and well rugated, cervix is smooth, pink, no lesions,  small to moderate amt of dark, red blood in vaginal vault -- cleared out with several large cotton-tipped, cervix visually closed, cervical exam deferred. Musculoskeletal:        General: Normal range of motion.  Skin:    General: Skin is warm and dry.  Neurological:     Mental Status: She is alert and oriented to person, place, and time.  Psychiatric:        Mood and Affect: Mood normal.        Behavior: Behavior normal.        Thought Content: Thought content normal.        Judgment: Judgment normal.    REACTIVE NST - FHR: 145 bpm / moderate variability / accels present / decels absent / TOCO: Occ UC's with UI   MAU Course  Procedures  MDM CCUA EFM OB MFM Limited U/S [place] CBC RPR T&S  Results for orders placed or performed during the hospital encounter of 12/01/22 (from the past 24 hour(s))  Urinalysis, Routine w  reflex microscopic -Urine, Clean Catch     Status: Abnormal   Collection Time: 12/01/22 12:57 PM  Result Value Ref Range   Color, Urine AMBER (A) YELLOW   APPearance CLOUDY (A) CLEAR   Specific Gravity, Urine 1.016 1.005 - 1.030   pH 6.0 5.0 - 8.0   Glucose, UA NEGATIVE NEGATIVE mg/dL   Hgb urine dipstick LARGE (A) NEGATIVE   Bilirubin Urine NEGATIVE NEGATIVE   Ketones, ur NEGATIVE NEGATIVE mg/dL   Protein, ur 30 (A) NEGATIVE mg/dL   Nitrite NEGATIVE NEGATIVE   Leukocytes,Ua MODERATE (A) NEGATIVE   RBC / HPF 11-20 0 - 5 RBC/hpf   WBC, UA 11-20 0 - 5 WBC/hpf   Bacteria, UA FEW (A) NONE SEEN   Squamous Epithelial / HPF 11-20 0 - 5 /HPF   Mucus PRESENT    Trichomonas, UA PRESENT (A) NONE SEEN  Type and screen     Status: None   Collection Time: 12/01/22  3:33 PM  Result Value Ref Range   ABO/RH(D) B POS    Antibody Screen NEG    Sample Expiration      12/04/2022,2359 Performed at [facility], [address] [place], [place] [address]   CBC     Status: Abnormal   Collection Time: 12/01/22  3:36 PM  Result Value Ref Range   WBC 12.0 (H) 4.0 - 10.5 K/uL   RBC 4.24 3.87 - 5.11 MIL/uL   Hemoglobin 12.5 12.0 - 15.0 g/dL   HCT 60.4 54.0 - 98.1 %   MCV 86.6 80.0 - 100.0 fL   MCH 29.5 26.0 - 34.0 pg   MCHC 34.1 30.0 - 36.0 g/dL   RDW 19.1 (H) 47.8 - 29.5 %   Platelets 191 150 - 400 K/uL   nRBC 0.0 0.0 - 0.2 %   [place] MFM OB LIMITED  Result Date: 12/01/2022 ----------------------------------------------------------------------  OBSTETRICS REPORT                       (Signed Final 12/01/2022 03:21 pm) ---------------------------------------------------------------------- Patient Info  ID #:       [id number]                          D.O.B.:  28-Aug-1985 (36 yrs)  Name:       Melissa Little  Visit Date: 12/01/2022 02:35 pm ---------------------------------------------------------------------- Performed By  Attending:        Noralee Space MD        Ref. Address:     [address]                                                             [place], [place]                                                             [address]  Performed By:     Percell Boston          Secondary Phy.:   [facility] MAU/Triage                    RDMS  Referred By:      Reva Bores          [facility] ---------------------------------------------------------------------- Orders  #  Description                           Code        Ordered By  1  [place] MFM OB LIMITED                     76815.01    Unika Nazareno  2  [place] MFM FETAL BPP WO NON               76819.01    Derrick Orris     STRESS ----------------------------------------------------------------------  #  Order #                     Accession #                Episode #  1  [id number]                   [id number]                 [id number]  2  [id number]                   [id number]                 [id number] ---------------------------------------------------------------------- Indications  Vaginal bleeding in pregnancy, third trimester O46.93  Preterm contractions                           O47.00  Advanced maternal age primigravida 1+,        O62.512  second trimester  Echogenic intracardiac focus of the heart      O35.8XX0  (  EIF)  LR NIPS, Neg horizon  [redacted] weeks gestation of pregnancy                Z3A.35 ---------------------------------------------------------------------- Fetal Evaluation  Num Of Fetuses:         1  Fetal Heart Rate(bpm):  150  Cardiac Activity:       Observed  Presentation:           Cephalic  Placenta:               Posterior Fundal  P. Cord Insertion:      Visualized, central  Amniotic Fluid  AFI FV:      Within normal limits  AFI Sum(cm)     %Tile       Largest Pocket(cm)  10.3            24          3.6  RUQ(cm)       RLQ(cm)       LUQ(cm)        LLQ(cm)  1.8           3.3           3.6            1.6  Comment:    Small abruption noted on  anterior edge of placenta 2.0 x 1.4x 3.0              cm. ---------------------------------------------------------------------- Biophysical Evaluation  Amniotic F.V:   Within normal limits       F. Tone:        Observed  F. Movement:    Observed                   Score:          8/8  F. Breathing:   Observed ---------------------------------------------------------------------- OB History  Gravidity:    6         Term:   4        Prem:   0        SAB:   1  TOP:          0       Ectopic:  0        Living: 4 ---------------------------------------------------------------------- Gestational Age  LMP:           36w 1d        Date:  03/23/22                   EDD:   12/28/22  Best:          35w 5d     Det. ByMarcella Dubs         EDD:   12/31/22                                      (06/30/22) ---------------------------------------------------------------------- Anatomy  Cranium:               Appears normal         Stomach:                Appears normal, left  sided  Thoracic:              Appears normal         Kidneys:                Appear normal  Diaphragm:             Appears normal         Bladder:                Appears normal ---------------------------------------------------------------------- Cervix Uterus Adnexa  Cervix  Not visualized (advanced GA >24wks)  Uterus  No abnormality visualized.  Right Ovary  Not visualized.  Left Ovary  Not visualized.  Cul De Sac  No free fluid seen.  Adnexa  No abnormality visualized ---------------------------------------------------------------------- Impression  Patient presented to MAU with complaints of vaginal  bleeding.  She reports she passed clots this morning.  Amniotic fluid is normal good fetal activity seen.  Cephalic  presentation.  Antenatal testing is reassuring.  BPP 8/8.  A  small hematoma is seen at the inferior edge of the placenta  (measurements above).  Impression: Placental  abruption.  Discussed with MAU team. ---------------------------------------------------------------------- Recommendations  -Recommend inpatient observation for 48 hours.  -Informed no further bleeding occurs, delivery may be  considered at [redacted] weeks gestation. ----------------------------------------------------------------------                  Noralee Space, MD Electronically Signed Final Report   12/01/2022 03:21 pm ----------------------------------------------------------------------   [place] MFM FETAL BPP WO NON STRESS  Result Date: 12/01/2022 ----------------------------------------------------------------------  OBSTETRICS REPORT                       (Signed Final 12/01/2022 03:21 pm) ---------------------------------------------------------------------- Patient Info  ID #:       [id number]                          D.O.B.:  04/24/1985 (36 yrs)  Name:       Melissa Little                    Visit Date: 12/01/2022 02:35 pm ---------------------------------------------------------------------- Performed By  Attending:        Noralee Space MD        Ref. Address:     [address]                                                             [place], [place]                                                             [address]  Performed By:     Percell Boston          Secondary Phy.:   [facility] MAU/Triage                    RDMS  Referred By:      Reva Bores          [facility] ---------------------------------------------------------------------- Orders  #  Description                           Code        Ordered By  1  [place] MFM OB LIMITED                     [id number]    Raelyn Mora  2  [place] MFM FETAL BPP WO NON               76819.01    Murrell Dome     STRESS ----------------------------------------------------------------------  #  Order #                     Accession #                Episode #  1  [id number]                    [id number]                 [id number]  2  [id number]                   [id number]                 [id number] ---------------------------------------------------------------------- Indications  Vaginal bleeding in pregnancy, third trimester O46.93  Preterm contractions                           O47.00  Advanced maternal age primigravida 76+,        O72.512  second trimester  Echogenic intracardiac focus of the heart      O35.8XX0  (EIF)  LR NIPS, Neg horizon  [redacted] weeks gestation of pregnancy                Z3A.35 ---------------------------------------------------------------------- Fetal Evaluation  Num Of Fetuses:         1  Fetal Heart Rate(bpm):  150  Cardiac Activity:       Observed  Presentation:           Cephalic  Placenta:               Posterior Fundal  P. Cord Insertion:      Visualized, central  Amniotic Fluid  AFI FV:      Within normal limits  AFI Sum(cm)     %Tile       Largest Pocket(cm)  10.3            24          3.6  RUQ(cm)       RLQ(cm)       LUQ(cm)        LLQ(cm)  1.8           3.3           3.6            1.6  Comment:    Small abruption noted on anterior edge of placenta 2.0 x 1.4x  3.0              cm. ---------------------------------------------------------------------- Biophysical Evaluation  Amniotic F.V:   Within normal limits       F. Tone:        Observed  F. Movement:    Observed                   Score:          8/8  F. Breathing:   Observed ---------------------------------------------------------------------- OB History  Gravidity:    6         Term:   4        Prem:   0        SAB:   1  TOP:          0       Ectopic:  0        Living: 4 ---------------------------------------------------------------------- Gestational Age  LMP:           36w 1d        Date:  03/23/22                   EDD:   12/28/22  Best:          35w 5d     Det. By:  Marcella Dubs         EDD:   12/31/22                                      (06/30/22)  ---------------------------------------------------------------------- Anatomy  Cranium:               Appears normal         Stomach:                Appears normal, left                                                                        sided  Thoracic:              Appears normal         Kidneys:                Appear normal  Diaphragm:             Appears normal         Bladder:                Appears normal ---------------------------------------------------------------------- Cervix Uterus Adnexa  Cervix  Not visualized (advanced GA >24wks)  Uterus  No abnormality visualized.  Right Ovary  Not visualized.  Left Ovary  Not visualized.  Cul De Sac  No free fluid seen.  Adnexa  No abnormality visualized ---------------------------------------------------------------------- Impression  Patient presented to MAU with complaints of vaginal  bleeding.  She reports she passed clots this morning.  Amniotic fluid is normal good fetal activity seen.  Cephalic  presentation.  Antenatal testing is reassuring.  BPP 8/8.  A  small hematoma is seen at the inferior edge of the placenta  (measurements above).  Impression: Placental abruption.  Discussed with MAU team. ---------------------------------------------------------------------- Recommendations  -Recommend  inpatient observation for 48 hours.  -Informed no further bleeding occurs, delivery may be  considered at [redacted] weeks gestation. ----------------------------------------------------------------------                  Noralee Space, MD Electronically Signed Final Report   12/01/2022 03:21 pm ----------------------------------------------------------------------     *Consult with Dr. Berton Lan @ [phone] - notified of patient's complaints, assessments & U/S results, tx plan admit to [facility] - agrees with plan  1550: Dr. Berton Lan on MAU to see patient and discuss admission and POC Assessment and Plan  1. Placental abruption in third trimester  2. Vaginal bleeding  3. [redacted]  weeks gestation of pregnancy - Admit to [facility] - Dr. Berton Lan assumes care at the time of admission  Raelyn Mora, CNM 12/01/2022, 2:08 PM

## 2022-12-02 ENCOUNTER — Inpatient Hospital Stay (HOSPITAL_COMMUNITY): Payer: Medicaid Other | Admitting: Anesthesiology

## 2022-12-02 ENCOUNTER — Encounter (HOSPITAL_COMMUNITY)
Admission: AD | Disposition: A | Discharge status: Home / Self Care | Payer: Self-pay | Source: Home / Self Care | Attending: Family Medicine

## 2022-12-02 ENCOUNTER — Encounter (HOSPITAL_COMMUNITY): Payer: Self-pay | Admitting: Obstetrics and Gynecology

## 2022-12-02 ENCOUNTER — Other Ambulatory Visit: Payer: Self-pay

## 2022-12-02 DIAGNOSIS — Z302 Encounter for sterilization: Secondary | ICD-10-CM

## 2022-12-02 DIAGNOSIS — O09523 Supervision of elderly multigravida, third trimester: Secondary | ICD-10-CM

## 2022-12-02 DIAGNOSIS — Z3A35 35 weeks gestation of pregnancy: Secondary | ICD-10-CM | POA: Diagnosis not present

## 2022-12-02 DIAGNOSIS — O99344 Other mental disorders complicating childbirth: Secondary | ICD-10-CM

## 2022-12-02 DIAGNOSIS — O4593 Premature separation of placenta, unspecified, third trimester: Secondary | ICD-10-CM | POA: Diagnosis not present

## 2022-12-02 HISTORY — PX: TUBAL LIGATION: SHX77

## 2022-12-02 LAB — CBC
HCT: 32.5 % — ABNORMAL LOW (ref 36.0–46.0)
HCT: 31.9 % — ABNORMAL LOW (ref 36.0–46.0)
Hemoglobin: 11 g/dL — ABNORMAL LOW (ref 12.0–15.0)
Hemoglobin: 10.7 g/dL — ABNORMAL LOW (ref 12.0–15.0)
MCH: 29.1 pg (ref 26.0–34.0)
MCH: 28.4 pg (ref 26.0–34.0)
MCHC: 33.8 g/dL (ref 30.0–36.0)
MCHC: 33.5 g/dL (ref 30.0–36.0)
MCV: 86 fL (ref 80.0–100.0)
MCV: 84.6 fL (ref 80.0–100.0)
Platelets: 201 10*3/uL (ref 150–400)
Platelets: 194 10*3/uL (ref 150–400)
RBC: 3.78 MIL/uL — ABNORMAL LOW (ref 3.87–5.11)
RBC: 3.77 MIL/uL — ABNORMAL LOW (ref 3.87–5.11)
RDW: 18.3 % — ABNORMAL HIGH (ref 11.5–15.5)
RDW: 18.3 % — ABNORMAL HIGH (ref 11.5–15.5)
WBC: 11.7 10*3/uL — ABNORMAL HIGH (ref 4.0–10.5)
WBC: 10 10*3/uL (ref 4.0–10.5)
nRBC: 0 % (ref 0.0–0.2)
nRBC: 0 % (ref 0.0–0.2)

## 2022-12-02 LAB — RAPID HIV SCREEN (HIV 1/2 AB+AG)
HIV 1/2 Antibodies: NONREACTIVE
HIV-1 P24 Antigen - HIV24: NONREACTIVE

## 2022-12-02 LAB — RPR: RPR Ser Ql: NONREACTIVE

## 2022-12-02 SURGERY — LIGATION, FALLOPIAN TUBE, POSTPARTUM
Anesthesia: Epidural | Laterality: Bilateral

## 2022-12-02 MED ORDER — SODIUM CHLORIDE 0.9% FLUSH: 3.0000 mL | INTRAVENOUS | Status: DC | PRN

## 2022-12-02 MED ORDER — EPHEDRINE 5 MG/ML INJ: 10.0000 mg | INTRAVENOUS | Status: DC | PRN

## 2022-12-02 MED ORDER — FENTANYL CITRATE (PF) 100 MCG/2ML IJ SOLN
INTRAMUSCULAR | Status: AC
  Filled 2022-12-02: qty 2

## 2022-12-02 MED ORDER — FENTANYL CITRATE (PF) 100 MCG/2ML IJ SOLN
INTRAMUSCULAR | Status: DC | PRN
  Administered 2022-12-02: 100 ug via INTRAVENOUS

## 2022-12-02 MED ORDER — SIMETHICONE 80 MG PO CHEW: 80.0000 mg | CHEWABLE_TABLET | ORAL | Status: DC | PRN

## 2022-12-02 MED ORDER — DIBUCAINE (PERIANAL) 1 % EX OINT: 1.0000 | TOPICAL_OINTMENT | CUTANEOUS | Status: DC | PRN

## 2022-12-02 MED ORDER — METHYLPREDNISOLONE SODIUM SUCC 125 MG IJ SOLR: 50.0000 mg | Freq: Four times a day (QID) | INTRAMUSCULAR | Status: DC

## 2022-12-02 MED ORDER — LIDOCAINE HCL (PF) 1 % IJ SOLN
INTRAMUSCULAR | Status: DC | PRN
  Administered 2022-12-02: 5 mL via EPIDURAL
  Administered 2022-12-02: 4 mL via EPIDURAL

## 2022-12-02 MED ORDER — FENTANYL CITRATE (PF) 100 MCG/2ML IJ SOLN
100.0000 ug | Freq: Once | INTRAMUSCULAR | Status: AC
  Administered 2022-12-02: 100 ug via INTRAVENOUS
  Filled 2022-12-02: qty 2

## 2022-12-02 MED ORDER — SODIUM CHLORIDE 0.9 % IV SOLN: 250.0000 mL | INTRAVENOUS | Status: DC | PRN

## 2022-12-02 MED ORDER — LIDOCAINE HCL (PF) 1 % IJ SOLN: 30.0000 mL | INTRAMUSCULAR | Status: DC | PRN

## 2022-12-02 MED ORDER — ONDANSETRON HCL 4 MG/2ML IJ SOLN
INTRAMUSCULAR | Status: AC
  Filled 2022-12-02: qty 2

## 2022-12-02 MED ORDER — TRANEXAMIC ACID-NACL 1000-0.7 MG/100ML-% IV SOLN: 1000.0000 mg | INTRAVENOUS | Status: AC

## 2022-12-02 MED ORDER — TRANEXAMIC ACID-NACL 1000-0.7 MG/100ML-% IV SOLN
INTRAVENOUS | Status: AC
  Administered 2022-12-02: 1000 mg
  Filled 2022-12-02: qty 100

## 2022-12-02 MED ORDER — SODIUM CHLORIDE 0.9% FLUSH: 3.0000 mL | Freq: Two times a day (BID) | INTRAVENOUS | Status: DC

## 2022-12-02 MED ORDER — SENNOSIDES-DOCUSATE SODIUM 8.6-50 MG PO TABS
2.0000 | ORAL_TABLET | ORAL | Status: DC
  Administered 2022-12-03 – 2022-12-04 (×2): 2 via ORAL
  Filled 2022-12-02 (×2): qty 2

## 2022-12-02 MED ORDER — OXYTOCIN-SODIUM CHLORIDE 30-0.9 UT/500ML-% IV SOLN
1.0000 m[IU]/min | INTRAVENOUS | Status: DC
  Administered 2022-12-02: 1 m[IU]/min via INTRAVENOUS

## 2022-12-02 MED ORDER — FENTANYL CITRATE (PF) 100 MCG/2ML IJ SOLN: INTRAMUSCULAR | Status: DC | PRN

## 2022-12-02 MED ORDER — TERBUTALINE SULFATE 1 MG/ML IJ SOLN: 0.2500 mg | Freq: Once | INTRAMUSCULAR | Status: DC | PRN

## 2022-12-02 MED ORDER — LACTATED RINGERS IV SOLN: 500.0000 mL | INTRAVENOUS | Status: DC | PRN

## 2022-12-02 MED ORDER — FENTANYL CITRATE (PF) 100 MCG/2ML IJ SOLN
INTRAMUSCULAR | Status: DC | PRN
  Administered 2022-12-02: 100 ug via EPIDURAL

## 2022-12-02 MED ORDER — ONDANSETRON HCL 4 MG PO TABS: 4.0000 mg | ORAL_TABLET | ORAL | Status: DC | PRN

## 2022-12-02 MED ORDER — SODIUM CHLORIDE 0.9 % IV SOLN
5.0000 10*6.[IU] | Freq: Once | INTRAVENOUS | Status: AC
  Administered 2022-12-02: 5 10*6.[IU] via INTRAVENOUS
  Filled 2022-12-02 (×3): qty 5

## 2022-12-02 MED ORDER — BUPIVACAINE HCL (PF) 0.25 % IJ SOLN
INTRAMUSCULAR | Status: AC
  Filled 2022-12-02: qty 10

## 2022-12-02 MED ORDER — FENTANYL CITRATE (PF) 100 MCG/2ML IJ SOLN
50.0000 ug | Freq: Once | INTRAMUSCULAR | Status: AC
  Administered 2022-12-02: 50 ug via INTRAVENOUS
  Filled 2022-12-02: qty 2

## 2022-12-02 MED ORDER — LACTATED RINGERS IV SOLN: INTRAVENOUS | Status: DC

## 2022-12-02 MED ORDER — COCONUT OIL OIL
1.0000 | TOPICAL_OIL | Status: DC | PRN
  Administered 2022-12-03: 1 via TOPICAL

## 2022-12-02 MED ORDER — PENICILLIN G POT IN DEXTROSE 60000 UNIT/ML IV SOLN
3.0000 10*6.[IU] | INTRAVENOUS | Status: DC
  Filled 2022-12-02: qty 50

## 2022-12-02 MED ORDER — LACTATED RINGERS IV SOLN
500.0000 mL | Freq: Once | INTRAVENOUS | Status: AC
  Administered 2022-12-02: 500 mL via INTRAVENOUS

## 2022-12-02 MED ORDER — ONDANSETRON HCL 4 MG/2ML IJ SOLN
INTRAMUSCULAR | Status: DC | PRN
  Administered 2022-12-02: 4 mg via INTRAVENOUS

## 2022-12-02 MED ORDER — ONDANSETRON HCL 4 MG/2ML IJ SOLN: 4.0000 mg | INTRAMUSCULAR | Status: DC | PRN

## 2022-12-02 MED ORDER — NIFEDIPINE 10 MG PO CAPS
10.0000 mg | ORAL_CAPSULE | Freq: Once | ORAL | Status: AC
  Administered 2022-12-02: 10 mg via ORAL
  Filled 2022-12-02: qty 1

## 2022-12-02 MED ORDER — OXYTOCIN-SODIUM CHLORIDE 30-0.9 UT/500ML-% IV SOLN
2.5000 [IU]/h | INTRAVENOUS | Status: DC
Start: 2022-12-02 — End: 2022-12-02
  Administered 2022-12-02: 2.5 [IU]/h via INTRAVENOUS
  Filled 2022-12-02: qty 500

## 2022-12-02 MED ORDER — ZOLPIDEM TARTRATE 5 MG PO TABS: 5.0000 mg | ORAL_TABLET | Freq: Every evening | ORAL | Status: DC | PRN

## 2022-12-02 MED ORDER — BENZOCAINE-MENTHOL 20-0.5 % EX AERO: 1.0000 | INHALATION_SPRAY | CUTANEOUS | Status: DC | PRN

## 2022-12-02 MED ORDER — DEXAMETHASONE SODIUM PHOSPHATE 10 MG/ML IJ SOLN
INTRAMUSCULAR | Status: AC
  Filled 2022-12-02: qty 1

## 2022-12-02 MED ORDER — DIPHENHYDRAMINE HCL 25 MG PO CAPS: 25.0000 mg | ORAL_CAPSULE | Freq: Four times a day (QID) | ORAL | Status: DC | PRN

## 2022-12-02 MED ORDER — BUPIVACAINE HCL (PF) 0.25 % IJ SOLN: 40.0000 mL | Freq: Once | INTRAMUSCULAR | Status: DC

## 2022-12-02 MED ORDER — LACTATED RINGERS IV SOLN: INTRAVENOUS | Status: DC | PRN

## 2022-12-02 MED ORDER — OXYCODONE HCL 5 MG PO TABS
5.0000 mg | ORAL_TABLET | ORAL | Status: DC | PRN
  Administered 2022-12-02 – 2022-12-03 (×4): 5 mg via ORAL
  Filled 2022-12-02 (×4): qty 1

## 2022-12-02 MED ORDER — DIPHENHYDRAMINE HCL 50 MG/ML IJ SOLN: 12.5000 mg | INTRAMUSCULAR | Status: DC | PRN

## 2022-12-02 MED ORDER — OXYTOCIN BOLUS FROM INFUSION
333.0000 mL | Freq: Once | INTRAVENOUS | Status: AC
  Administered 2022-12-02: 333 mL via INTRAVENOUS

## 2022-12-02 MED ORDER — SOD CITRATE-CITRIC ACID 500-334 MG/5ML PO SOLN
30.0000 mL | ORAL | Status: DC | PRN
Start: 2022-12-02 — End: 2022-12-02

## 2022-12-02 MED ORDER — FENTANYL-BUPIVACAINE-NACL 0.5-0.125-0.9 MG/250ML-% EP SOLN
12.0000 mL/h | EPIDURAL | Status: DC | PRN
  Administered 2022-12-02: 12 mL/h via EPIDURAL
  Filled 2022-12-02: qty 250

## 2022-12-02 MED ORDER — STERILE WATER FOR IRRIGATION IR SOLN
Status: DC | PRN
  Administered 2022-12-02: 1000 mL

## 2022-12-02 MED ORDER — METHYLPREDNISOLONE SODIUM SUCC 125 MG IJ SOLR
100.0000 mg | Freq: Once | INTRAMUSCULAR | Status: AC
  Administered 2022-12-02: 100 mg via INTRAVENOUS
  Filled 2022-12-02: qty 2

## 2022-12-02 MED ORDER — ACETAMINOPHEN 325 MG PO TABS
650.0000 mg | ORAL_TABLET | ORAL | Status: DC | PRN
Start: 2022-12-02 — End: 2022-12-02

## 2022-12-02 MED ORDER — IBUPROFEN 600 MG PO TABS
600.0000 mg | ORAL_TABLET | Freq: Four times a day (QID) | ORAL | Status: DC
Start: 2022-12-02 — End: 2022-12-04
  Administered 2022-12-02 – 2022-12-04 (×7): 600 mg via ORAL
  Filled 2022-12-02 (×7): qty 1

## 2022-12-02 MED ORDER — PRENATAL MULTIVITAMIN CH
1.0000 | ORAL_TABLET | Freq: Every day | ORAL | Status: DC
  Administered 2022-12-03 – 2022-12-04 (×2): 1 via ORAL
  Filled 2022-12-02 (×2): qty 1

## 2022-12-02 MED ORDER — WITCH HAZEL-GLYCERIN EX PADS: 1.0000 | MEDICATED_PAD | CUTANEOUS | Status: DC | PRN

## 2022-12-02 MED ORDER — ANAKINRA 100 MG/0.67ML ~~LOC~~ SOSY
100.0000 mg | PREFILLED_SYRINGE | SUBCUTANEOUS | Status: DC
  Administered 2022-12-02 – 2022-12-03 (×2): 100 mg via SUBCUTANEOUS

## 2022-12-02 MED ORDER — SODIUM CHLORIDE 0.9 % IR SOLN
Status: DC | PRN
  Administered 2022-12-02: 1000 mL

## 2022-12-02 MED ORDER — KETOROLAC TROMETHAMINE 30 MG/ML IJ SOLN
INTRAMUSCULAR | Status: DC | PRN
  Administered 2022-12-02: 30 mg via INTRAVENOUS

## 2022-12-02 MED ORDER — SODIUM BICARBONATE 8.4 % IV SOLN
INTRAVENOUS | Status: DC | PRN
  Administered 2022-12-02: 2 mL via EPIDURAL
  Administered 2022-12-02: 3 mL via EPIDURAL
  Administered 2022-12-02: 2 mL via EPIDURAL
  Administered 2022-12-02: 5 mL via EPIDURAL
  Administered 2022-12-02: 3 mL via EPIDURAL

## 2022-12-02 MED ORDER — LIDOCAINE-EPINEPHRINE (PF) 2 %-1:200000 IJ SOLN
INTRAMUSCULAR | Status: AC
Start: 2022-12-02 — End: ?
  Filled 2022-12-02: qty 20

## 2022-12-02 MED ORDER — PHENYLEPHRINE 80 MCG/ML (10ML) SYRINGE FOR IV PUSH (FOR BLOOD PRESSURE SUPPORT): 80.0000 ug | PREFILLED_SYRINGE | INTRAVENOUS | Status: DC | PRN

## 2022-12-02 MED ORDER — MIDAZOLAM HCL 2 MG/2ML IJ SOLN
INTRAMUSCULAR | Status: DC | PRN
  Administered 2022-12-02 (×2): 1 mg via INTRAVENOUS

## 2022-12-02 MED ORDER — TERBUTALINE SULFATE 1 MG/ML IJ SOLN
0.2500 mg | Freq: Once | INTRAMUSCULAR | Status: AC
  Administered 2022-12-02: 0.25 mg via SUBCUTANEOUS
  Filled 2022-12-02: qty 1

## 2022-12-02 MED ORDER — TETANUS-DIPHTH-ACELL PERTUSSIS 5-2.5-18.5 LF-MCG/0.5 IM SUSY
0.5000 mL | PREFILLED_SYRINGE | Freq: Once | INTRAMUSCULAR | Status: DC
Start: 2022-12-03 — End: 2022-12-04

## 2022-12-02 MED ORDER — ACETAMINOPHEN 10 MG/ML IV SOLN
INTRAVENOUS | Status: AC
Start: 2022-12-02 — End: ?
  Filled 2022-12-02: qty 100

## 2022-12-02 MED ORDER — BUPIVACAINE HCL (PF) 0.25 % IJ SOLN
INTRAMUSCULAR | Status: DC | PRN
  Administered 2022-12-02: 40 mL

## 2022-12-02 MED ORDER — ACETAMINOPHEN 325 MG PO TABS
650.0000 mg | ORAL_TABLET | ORAL | Status: DC | PRN
  Administered 2022-12-04: 650 mg via ORAL
  Filled 2022-12-02: qty 2

## 2022-12-02 MED ORDER — CEFAZOLIN SODIUM-DEXTROSE 2-3 GM-%(50ML) IV SOLR
INTRAVENOUS | Status: DC | PRN
  Administered 2022-12-02: 2 g via INTRAVENOUS

## 2022-12-02 MED ORDER — BUPIVACAINE HCL (PF) 0.25 % IJ SOLN
INTRAMUSCULAR | Status: AC
  Filled 2022-12-02: qty 30

## 2022-12-02 MED ORDER — KETOROLAC TROMETHAMINE 30 MG/ML IJ SOLN
INTRAMUSCULAR | Status: AC
  Filled 2022-12-02: qty 1

## 2022-12-02 MED ORDER — ONDANSETRON HCL 4 MG/2ML IJ SOLN: 4.0000 mg | Freq: Four times a day (QID) | INTRAMUSCULAR | Status: DC | PRN

## 2022-12-02 MED ORDER — MIDAZOLAM HCL 2 MG/2ML IJ SOLN
INTRAMUSCULAR | Status: AC
  Filled 2022-12-02: qty 2

## 2022-12-02 SURGICAL SUPPLY — 29 items
BLADE SURG 15 STRL LF C SS BP (BLADE) ×2 IMPLANT
BLADE SURG 15 STRL SS (BLADE) ×1
CLOTH BEACON ORANGE TIMEOUT ST (SAFETY) ×2 IMPLANT
DERMABOND ADVANCED .7 DNX12 (GAUZE/BANDAGES/DRESSINGS) ×2 IMPLANT
DRSG OPSITE POSTOP 3X4 (GAUZE/BANDAGES/DRESSINGS) ×2 IMPLANT
ELECT REM PT RETURN 9FT ADLT (ELECTROSURGICAL)
ELECTRODE REM PT RTRN 9FT ADLT (ELECTROSURGICAL) IMPLANT
GLOVE BIOGEL PI IND STRL 7.0 (GLOVE) ×2 IMPLANT
GLOVE BIOGEL PI IND STRL 8 (GLOVE) ×2 IMPLANT
GLOVE ECLIPSE 8.0 STRL XLNG CF (GLOVE) ×2 IMPLANT
GOWN STRL REUS W/TWL LRG LVL3 (GOWN DISPOSABLE) ×4 IMPLANT
NDL HYPO 22X1.5 SAFETY MO (MISCELLANEOUS) IMPLANT
NEEDLE HYPO 22GX1.5 SAFETY (NEEDLE) IMPLANT
NEEDLE HYPO 22X1.5 SAFETY MO (MISCELLANEOUS) ×1 IMPLANT
NS IRRIG 1000ML POUR BTL (IV SOLUTION) ×2 IMPLANT
PACK ABDOMINAL MINOR (CUSTOM PROCEDURE TRAY) ×2 IMPLANT
PENCIL BUTTON HOLSTER BLD 10FT (ELECTRODE) IMPLANT
PROTECTOR NERVE ULNAR (MISCELLANEOUS) ×2 IMPLANT
SPONGE LAP 4X18 RFD (DISPOSABLE) IMPLANT
SUT PLAIN 2 0 (SUTURE) ×3
SUT PLAIN ABS 2-0 CT1 27XMFL (SUTURE) ×4 IMPLANT
SUT VIC AB 0 CT1 27 (SUTURE) ×1
SUT VIC AB 0 CT1 27XBRD ANBCTR (SUTURE) ×2 IMPLANT
SUT VIC AB 4-0 KS 27 (SUTURE) ×2 IMPLANT
SUT VIC AB CT1 27XBRD ANBCTRL (SUTURE) ×1
SUT VICRYL 0 UR6 27IN ABS (SUTURE) IMPLANT
SYR CONTROL 10ML LL (SYRINGE) IMPLANT
TOWEL OR 17X24 6PK STRL BLUE (TOWEL DISPOSABLE) ×4 IMPLANT
TRAY FOLEY CATH SILVER 14FR (SET/KITS/TRAYS/PACK) ×2 IMPLANT

## 2022-12-02 NOTE — Anesthesia Preprocedure Evaluation (Signed)
Anesthesia Evaluation  Patient identified by MRN, date of birth, ID band Patient awake    Reviewed: Allergy & Precautions, NPO status , Patient's Chart, lab work & pertinent test results  History of Anesthesia Complications Negative for: history of anesthetic complications  Airway Mallampati: II  TM Distance: >3 FB Neck ROM: Full    Dental  (+) Dental Advisory Given   Pulmonary neg pulmonary ROS   Pulmonary exam normal breath sounds clear to auscultation       Cardiovascular negative cardio ROS  Rhythm:Regular Rate:Normal     Neuro/Psych  PSYCHIATRIC DISORDERS Anxiety     negative neurological ROS     GI/Hepatic negative GI ROS, Neg liver ROS,,,  Endo/Other  negative endocrine ROS    Renal/GU negative Renal ROS     Musculoskeletal  (+) Arthritis  (Still's disease), Rheumatoid disorders,    Abdominal   Peds  Hematology  (+) Blood dyscrasia, anemia Lab Results      Component                Value               Date                      WBC                      10.0                12/02/2022                HGB                      10.7 (L)            12/02/2022                HCT                      31.9 (L)            12/02/2022                MCV                      84.6                12/02/2022                PLT                      194                 12/02/2022              Anesthesia Other Findings Chronic steroid use. Receiving stress dose steroids.  Reproductive/Obstetrics                             Anesthesia Physical Anesthesia Plan  ASA: 2  Anesthesia Plan: Epidural   Post-op Pain Management:    Induction:   PONV Risk Score and Plan: 2 and Ondansetron and Treatment may vary due to age or medical condition  Airway Management Planned: Natural Airway  Additional Equipment:   Intra-op Plan:   Post-operative Plan:   Informed Consent: I have reviewed the patients  History and Physical, chart, labs and discussed the procedure including  the risks, benefits and alternatives for the proposed anesthesia with the patient or authorized representative who has indicated his/her understanding and acceptance.       Plan Discussed with: CRNA and Anesthesiologist  Anesthesia Plan Comments: (Will use patient's existing epidural as she has been comfortable with it.)       Anesthesia Quick Evaluation

## 2022-12-02 NOTE — Discharge Summary (Signed)
Postpartum Discharge Summary     Patient Name: Melissa Little DOB: 1985-12-24 MRN: [id number]  Date of admission: 12/01/2022 Delivery date:12/02/2022 Delivering provider: CHUBB, CASEY C Date of discharge: 12/04/2022  Admitting diagnosis: Antepartum placental abruption [O45.90] Intrauterine pregnancy: [redacted]w[redacted]d     Secondary diagnosis:  Principal Problem:   Antepartum placental abruption Active Problems:   Adult-onset Still's disease (HCC)   Request for sterilization   Skin abscess  Additional problems: Posterior thigh abscess    Discharge diagnosis: Term Pregnancy Delivered and marginal placental abrutpion, Still's Disease                                               Post partum procedures: None Augmentation: Pitocin Complications: Placental Abruption  Hospital course: Admitted for VB 2/2 small marginal placental abruption at leading edge of placenta.  Originally admitted to Antepartum for observation at [redacted]w[redacted]d. Patient received doses of terb overnight in addition to procardia and fentanyl but continued bleeding overnight.  Spoke with MFM Dr. Judeth Cornfield who agrees with proceeding with delivery.  Pt was moved to L&D 11/14 with augmentation of pitocin only.  Pt SROM at time of delivery and confirmed finding of placental abruption (see delivery note for additional details).  Delivery was otherwise uncomplicated and she tolerated stress dosed steroids at time of delivery. On PPD#1, she was found to have a draining abscess of her left posterior thigh for which she was placed on Clindamycin for a 5 day course. In regards to her Still's disease, the plan is to continue Prednisone 10mg  daily until she is seen by her rheumatologist for followup.  Magnesium Sulfate received: No BMZ received: No Rhophylac:N/A MMR:N/A T-DaP: Patient declined   Flu: No RSV Vaccine received: No Transfusion:No  Immunizations received: Immunization History  Administered Date(s) Administered    Influenza-Unspecified 12/04/2015   Tdap 02/09/2014    Physical exam  Vitals:   12/03/22 0527 12/03/22 0830 12/03/22 1400 12/03/22 2044  BP: 132/78 123/70 129/83 115/68  Pulse: 79 85 82 80  Resp: 18 16 18 18   Temp:  97.8 F (36.6 C) 98.1 F (36.7 C) 98.9 F (37.2 C)  TempSrc:  Oral Oral Oral  SpO2: 100% 100%  100%  Weight:      Height:       General: alert, cooperative, and no distress Lochia: appropriate Uterine Fundus: firm Incision: Dressing is clean, dry, and intact DVT Evaluation: No evidence of DVT seen on physical exam. No cords or calf tenderness. No significant calf/ankle edema. Labs: Lab Results  Component Value Date   WBC 12.7 (H) 12/03/2022   HGB 10.2 (L) 12/03/2022   HCT 29.7 (L) 12/03/2022   MCV 84.1 12/03/2022   PLT 189 12/03/2022      Latest Ref Rng & Units 10/04/2022    4:32 AM  CMP  Glucose 70 - 99 mg/dL 84   BUN 6 - 20 mg/dL 5   Creatinine 4.09 - 8.11 mg/dL 9.14   Sodium 782 - 956 mmol/L 130   Potassium 3.5 - 5.1 mmol/L 3.5   Chloride 98 - 111 mmol/L 102   CO2 22 - 32 mmol/L 19   Calcium 8.9 - 10.3 mg/dL 7.5   Total Protein 6.5 - 8.1 g/dL 4.3   Total Bilirubin 0.3 - 1.2 mg/dL 0.3   Alkaline Phos 38 - 126 U/L 77   AST 15 -  41 U/L 73   ALT 0 - 44 U/L 17    Edinburgh Score:    12/03/2022    8:30 AM  Edinburgh Postnatal Depression Scale Screening Tool  I have been able to laugh and see the funny side of things. 0  I have looked forward with enjoyment to things. 0  I have blamed myself unnecessarily when things went wrong. 1  I have been anxious or worried for no good reason. 0  I have felt scared or panicky for no good reason. 0  Things have been getting on top of me. 1  I have been so unhappy that I have had difficulty sleeping. 0  I have felt sad or miserable. 0  I have been so unhappy that I have been crying. 0  The thought of harming myself has occurred to me. 0  Edinburgh Postnatal Depression Scale Total 2   Edinburgh Postnatal  Depression Scale Total: 2   After visit meds:  Allergies as of 12/04/2022   No Known Allergies      Medication List     TAKE these medications    anakinra 100 MG/0.67ML Sosy injection Commonly known as: KINERET Inject into the vein.   clindamycin 150 MG capsule Commonly known as: CLEOCIN Take 3 capsules (450 mg total) by mouth every 8 (eight) hours. First dose afternoon of 11/16   folic acid 400 MCG tablet Commonly known as: FOLVITE Take 400 mcg by mouth daily.   ibuprofen 600 MG tablet Commonly known as: ADVIL Take 1 tablet (600 mg total) by mouth every 6 (six) hours.   oxyCODONE 5 MG immediate release tablet Commonly known as: Oxy IR/ROXICODONE Take 1 tablet (5 mg total) by mouth every 6 (six) hours as needed for severe pain (pain score 7-10) or breakthrough pain.   predniSONE 10 MG tablet Commonly known as: DELTASONE Take 1 tablet (10 mg total) by mouth daily with breakfast.   senna-docusate 8.6-50 MG tablet Commonly known as: Senokot-S Take 2 tablets by mouth daily.         Discharge home in stable condition Infant Feeding:  Formula Infant Disposition:home with mother Discharge instruction: per After Visit Summary and Postpartum booklet. Activity: Advance as tolerated. Pelvic rest for 6 weeks.  Diet: routine diet Future Appointments:No future appointments. Follow up Visit: Message to [facility] for 11/14  Please schedule this patient for a In person postpartum visit in 4 weeks with the following provider: Any provider. Additional Postpartum F/U:Incision check 1 week  High risk pregnancy complicated by:  Still's Disease, AMA, Anemia, Marginal placental abruption  Delivery mode:  Vaginal, SpontaneousSVD Anticipated Birth Control:  BTL done [facility]   12/04/2022 Sundra Aland, MD

## 2022-12-02 NOTE — Anesthesia Procedure Notes (Signed)
Epidural Patient location during procedure: OB Start time: 12/02/2022 11:57 AM End time: 12/02/2022 12:00 PM  Staffing Anesthesiologist: Beryle Lathe, MD Performed: anesthesiologist   Preanesthetic Checklist Completed: patient identified, IV checked, risks and benefits discussed, monitors and equipment checked, pre-op evaluation and timeout performed  Epidural Patient position: sitting Prep: DuraPrep Patient monitoring: continuous pulse ox and blood pressure Approach: midline Location: L2-L3 Injection technique: LOR saline  Needle:  Needle type: Tuohy  Needle gauge: 17 G Needle length: 9 cm Needle insertion depth: 5.5 cm Catheter size: 19 Gauge Catheter at skin depth: 10.5 cm Test dose: negative and Other (1% lidocaine)  Assessment Events: blood not aspirated and no cerebrospinal fluid  Additional Notes Patient identified. Risks including, but not limited to, bleeding, infection, nerve damage, paralysis, inadequate analgesia, blood pressure changes, nausea, vomiting, allergic reaction, postpartum back pain, itching, and headache were discussed. Patient expressed understanding and wished to proceed. Sterile prep and drape, including hand hygiene, mask, and sterile gloves were used. The patient was positioned and the spine was prepped. The skin was anesthetized with lidocaine. No paraesthesia or other complication noted. The patient did not experience any signs of intravascular injection such as tinnitus or metallic taste in mouth, nor signs of intrathecal spread such as rapid motor block. Please see nursing notes for vital signs. The patient tolerated the procedure well.   Leslye Peer, MDReason for block:procedure for pain

## 2022-12-02 NOTE — Op Note (Addendum)
Melissa Little 12/01/2022 - 12/02/2022  PREOPERATIVE DIAGNOSES: Multiparity, undesired fertility  POSTOPERATIVE DIAGNOSES: Multiparity, undesired fertility  PROCEDURE:  Postpartum Bilateral Fimbriectomy  SURGEON: Dr. Despina Hidden Assist: Mittie Bodo, MD  An experienced assistant was required given the standard of surgical care given the complexity of the case.  This assistant was needed for exposure, dissection, suctioning, retraction, instrument exchange, and for overall help during the procedure.  ANESTHESIA:  Epidural and local analgesia using 30 ml of 0.5% Marcaine  COMPLICATIONS:  None immediate.  ESTIMATED BLOOD LOSS: <20cc   INDICATIONS:  37 y.o. [id number] with undesired fertility, status post vaginal delivery, desires permanent sterilization.  Other reversible forms of contraception were discussed with patient; she declines all other modalities. Risks of procedure discussed with patient including but not limited to: risk of regret, permanence of method, bleeding, infection, injury to surrounding organs and need for additional procedures.  Failure risk of 1% with increased risk of ectopic gestation if pregnancy occurs was also discussed with patient.      FINDINGS:  Normal uterus, tubes, and ovaries, but engorged b/l ovarian and uterine veins  PROCEDURE DETAILS: The patient was taken to the operating room where her epidural anesthesia was dosed up to surgical level and found to be adequate.  She was then placed in the dorsal supine position and prepped and draped in sterile fashion.  After an adequate timeout was performed, attention was turned to the patient's abdomen where a small transverse skin incision was made under the umbilical fold. The incision was taken down to the layer of fascia using the scalpel, and fascia was incised using scalpel, and it as well as peritoneum were extended bilaterally using bluntly. Attention was then turned to the patient's uterus, the right fallopian tube was  grasped with Babcock clamps and followed out to the fimbriated end. The distal 2-1/2 cm portion of the tube removed using the modified Pomeroy technique. Two figure of 8 sutures thrown to help obtain good hemostasis.  Attention was then turned to the left fallopian tube which was identified including the fimbriated end. A 2-1/2 cm segment in the distal isthmic and ampullary region portion of the tube was removed again using a modified Pomeroy technique.  A single figure of 8 suture thrown to help obtain good hemostasis. The instruments were then removed from the patient's abdomen and the fascial incision was repaired with 0 Vicryl, and the skin was closed with a 4-0 Vicryl subcuticular stitch. The patient tolerated the procedure well.  Instrument, sponge, and needle counts were correct times two.  The patient was then taken to the recovery room awake and in stable condition.   Hessie Dibble, MD FMOB Fellow, Faculty practice [facility], Center for [facility]    Bilateral fimbriectomy was performed because of the large parametrial veins that were present, there was no opportunity to safely remove more of the length of the tube, only the fimbria, even ligasure would have been unreasonable.  Attestation of Attending Supervision of Advanced Practitioner (CNM/NP/PA): Evaluation and management procedures were performed by the Advanced Practitioner under my supervision and collaboration.  I have reviewed the Advanced Practitioner's note and chart, and I agree with the management and plan.  Rockne Coons MD Attending Physician for the Center for [facility] 12/03/2022 5:11 AM

## 2022-12-02 NOTE — Progress Notes (Signed)
Patient ID: Melissa Little, female   DOB: 08/09/1985, 37 y.o.   MRN: [id number] FACULTY PRACTICE ANTEPARTUM(COMPREHENSIVE) NOTE  Melissa Little is a 37 y.o. V3X1062 with Estimated Date of Delivery: 12/31/22   By  early scan [redacted]w[redacted]d  who is admitted for 3rd trimester bleeding, small abruption at leading edge of the placenta.    Fetal presentation is cephalic. Length of Stay:  1  Days  Date of admission:12/01/2022  Subjective: Reports her contractions had settled down but they are starting to pick up again, bleeding is minimal on pad I observed Patient reports the fetal movement as active. Patient reports uterine contraction  activity as irregular. Patient reports  vaginal bleeding as spotting. Patient describes fluid per vagina as None.  Vitals:  Blood pressure (!) 109/50, pulse 81, temperature 98 F (36.7 C), temperature source Oral, resp. rate 16, last menstrual period 03/23/2022, SpO2 97%. Vitals:   12/01/22 1250 12/01/22 1627 12/01/22 2000 12/02/22 0636  BP: 137/76 138/74 128/79 (!) 109/50  Pulse: 89 78 84 81  Resp: 16 17 16 16   Temp: 98.5 F (36.9 C) 98.4 F (36.9 C) 98.6 F (37 C) 98 F (36.7 C)  TempSrc: Oral Oral Oral Oral  SpO2: 100% 100% 100% 97%   Physical Examination:  General appearance - alert, well appearing, and in no distress Abdomen is soft and non tender Fundal Height:  size equals dates Pelvic Exam:  examination not indicated Cervical Exam: Not evaluated.  Extremities: extremities normal, atraumatic, no cyanosis or edema with DTRs 2+ bilaterally Membranes:intact  Fetal Monitoring:  Baseline: 140s bpm, Variability: Good {> 6 bpm), Accelerations: Reactive, and Decelerations: rare variable 1 an hour    reactive  Labs:  Results for orders placed or performed during the hospital encounter of 12/01/22 (from the past 24 hour(s))  Urinalysis, Routine w reflex microscopic -Urine, Clean Catch   Collection Time: 12/01/22 12:57 PM  Result Value Ref Range   Color, Urine  AMBER (A) YELLOW   APPearance CLOUDY (A) CLEAR   Specific Gravity, Urine 1.016 1.005 - 1.030   pH 6.0 5.0 - 8.0   Glucose, UA NEGATIVE NEGATIVE mg/dL   Hgb urine dipstick LARGE (A) NEGATIVE   Bilirubin Urine NEGATIVE NEGATIVE   Ketones, ur NEGATIVE NEGATIVE mg/dL   Protein, ur 30 (A) NEGATIVE mg/dL   Nitrite NEGATIVE NEGATIVE   Leukocytes,Ua MODERATE (A) NEGATIVE   RBC / HPF 11-20 0 - 5 RBC/hpf   WBC, UA 11-20 0 - 5 WBC/hpf   Bacteria, UA FEW (A) NONE SEEN   Squamous Epithelial / HPF 11-20 0 - 5 /HPF   Mucus PRESENT    Trichomonas, UA PRESENT (A) NONE SEEN  Type and screen   Collection Time: 12/01/22  3:33 PM  Result Value Ref Range   ABO/RH(D) B POS    Antibody Screen NEG    Sample Expiration      12/04/2022,2359 Performed at [facility], [address] [place], [place] [address]    Unit Number [id number]    Blood Component Type RED CELLS,LR    Unit division 00    Status of Unit ALLOCATED    Transfusion Status OK TO TRANSFUSE    Crossmatch Result Compatible    Unit Number [id number]    Blood Component Type RED CELLS,LR    Unit division 00    Status of Unit ALLOCATED    Transfusion Status OK TO TRANSFUSE    Crossmatch Result Compatible   BPAM RBC   Collection Time: 12/01/22  3:33 PM  Result Value Ref Range   Blood Product Unit Number [id number]    PRODUCT CODE E0382V00    Unit Type and Rh 7300    Blood Product Expiration Date 119147829562    Blood Product Unit Number [id number]    PRODUCT CODE E0382V00    Unit Type and Rh 7300    Blood Product Expiration Date [id number]   CBC   Collection Time: 12/01/22  3:36 PM  Result Value Ref Range   WBC 12.0 (H) 4.0 - 10.5 K/uL   RBC 4.24 3.87 - 5.11 MIL/uL   Hemoglobin 12.5 12.0 - 15.0 g/dL   HCT 02.7 25.3 - 66.4 %   MCV 86.6 80.0 - 100.0 fL   MCH 29.5 26.0 - 34.0 pg   MCHC 34.1 30.0 - 36.0 g/dL   RDW 40.3 (H) 47.4 - 25.9 %   Platelets 191 150 - 400 K/uL   nRBC 0.0 0.0 - 0.2 %  Prepare RBC  (crossmatch)   Collection Time: 12/01/22  5:40 PM  Result Value Ref Range   Order Confirmation      ORDER PROCESSED BY BLOOD BANK Performed at [facility], [address], [place], [place] [address]   ABO/Rh   Collection Time: 12/01/22  5:40 PM  Result Value Ref Range   ABO/RH(D)      B POS Performed at [facility], [address] [place], [place] [address]   CBC   Collection Time: 12/02/22  4:38 AM  Result Value Ref Range   WBC 11.7 (H) 4.0 - 10.5 K/uL   RBC 3.78 (L) 3.87 - 5.11 MIL/uL   Hemoglobin 11.0 (L) 12.0 - 15.0 g/dL   HCT 29.5 (L) 18.8 - 41.6 %   MCV 86.0 80.0 - 100.0 fL   MCH 29.1 26.0 - 34.0 pg   MCHC 33.8 30.0 - 36.0 g/dL   RDW 60.6 (H) 30.1 - 60.1 %   Platelets 201 150 - 400 K/uL   nRBC 0.0 0.0 - 0.2 %  Rapid HIV screen (HIV 1/2 Ab+Ag)   Collection Time: 12/02/22  4:38 AM  Result Value Ref Range   HIV-1 P24 Antigen - HIV24 NON REACTIVE NON REACTIVE   HIV 1/2 Antibodies NON REACTIVE NON REACTIVE   Interpretation (HIV Ag Ab)      A non reactive test result means that HIV 1 or HIV 2 antibodies and HIV 1 p24 antigen were not detected in the specimen.    Imaging Studies:    [place] MFM OB LIMITED  Result Date: 12/01/2022 ----------------------------------------------------------------------  OBSTETRICS REPORT                       (Signed Final 12/01/2022 03:21 pm) ---------------------------------------------------------------------- Patient Info  ID #:       [id number]                          D.O.B.:  December 28, 1985 (37 yrs)  Name:       Melissa Little                    Visit Date: 12/01/2022 02:35 pm ---------------------------------------------------------------------- Performed By  Attending:        Noralee Space MD        Ref. Address:     [address]  [place], [place]                                                             [address]  Performed By:     Percell Boston          Secondary Phy.:   [facility]  MAU/Triage                    RDMS  Referred By:      Reva Bores          [facility] ---------------------------------------------------------------------- Orders  #  Description                           Code        Ordered By  1  [place] MFM OB LIMITED                     76815.01    ROLITTA DAWSON  2  [place] MFM FETAL BPP WO NON               76819.01    ROLITTA DAWSON     STRESS ----------------------------------------------------------------------  #  Order #                     Accession #                Episode #  1  [id number]                   [id number]                 [id number]  2  [id number]                   [id number]                 [id number] ---------------------------------------------------------------------- Indications  Vaginal bleeding in pregnancy, third trimester O46.93  Preterm contractions                           O47.00  Advanced maternal age primigravida 100+,        O81.512  second trimester  Echogenic intracardiac focus of the heart      O35.8XX0  (EIF)  LR NIPS, Neg horizon  [redacted] weeks gestation of pregnancy                Z3A.35 ---------------------------------------------------------------------- Fetal Evaluation  Num Of Fetuses:         1  Fetal Heart Rate(bpm):  150  Cardiac Activity:       Observed  Presentation:           Cephalic  Placenta:               Posterior Fundal  P. Cord Insertion:  Visualized, central  Amniotic Fluid  AFI FV:      Within normal limits  AFI Sum(cm)     %Tile       Largest Pocket(cm)  10.3            24          3.6  RUQ(cm)       RLQ(cm)       LUQ(cm)        LLQ(cm)  1.8           3.3           3.6            1.6  Comment:    Small abruption noted on anterior edge of placenta 2.0 x 1.4x 3.0              cm. ---------------------------------------------------------------------- Biophysical Evaluation  Amniotic F.V:   Within normal limits       F. Tone:         Observed  F. Movement:    Observed                   Score:          8/8  F. Breathing:   Observed ---------------------------------------------------------------------- OB History  Gravidity:    6         Term:   4        Prem:   0        SAB:   1  TOP:          0       Ectopic:  0        Living: 4 ---------------------------------------------------------------------- Gestational Age  LMP:           36w 1d        Date:  03/23/22                   EDD:   12/28/22  Best:          35w 5d     Det. By:  Marcella Dubs         EDD:   12/31/22                                      (06/30/22) ---------------------------------------------------------------------- Anatomy  Cranium:               Appears normal         Stomach:                Appears normal, left                                                                        sided  Thoracic:              Appears normal         Kidneys:                Appear normal  Diaphragm:             Appears normal         Bladder:  Appears normal ---------------------------------------------------------------------- Cervix Uterus Adnexa  Cervix  Not visualized (advanced GA >24wks)  Uterus  No abnormality visualized.  Right Ovary  Not visualized.  Left Ovary  Not visualized.  Cul De Sac  No free fluid seen.  Adnexa  No abnormality visualized ---------------------------------------------------------------------- Impression  Patient presented to MAU with complaints of vaginal  bleeding.  She reports she passed clots this morning.  Amniotic fluid is normal good fetal activity seen.  Cephalic  presentation.  Antenatal testing is reassuring.  BPP 8/8.  A  small hematoma is seen at the inferior edge of the placenta  (measurements above).  Impression: Placental abruption.  Discussed with MAU team. ---------------------------------------------------------------------- Recommendations  -Recommend inpatient observation for 48 hours.  -Informed no further bleeding occurs,  delivery may be  considered at [redacted] weeks gestation. ----------------------------------------------------------------------                  Noralee Space, MD Electronically Signed Final Report   12/01/2022 03:21 pm ----------------------------------------------------------------------   [place] MFM FETAL BPP WO NON STRESS  Result Date: 12/01/2022 ----------------------------------------------------------------------  OBSTETRICS REPORT                       (Signed Final 12/01/2022 03:21 pm) ---------------------------------------------------------------------- Patient Info  ID #:       [id number]                          D.O.B.:  [birth year] (37 yrs)  Name:       Melissa Little                    Visit Date: 12/01/2022 02:35 pm ---------------------------------------------------------------------- Performed By  Attending:        Noralee Space MD        Ref. Address:     [address]                                                             [place], [place]                                                             [address]  Performed By:     Percell Boston          Secondary Phy.:   [facility] MAU/Triage                    RDMS  Referred By:      Reva Bores          [facility] ---------------------------------------------------------------------- Orders  #  Description  Code        Ordered By  1  [place] MFM OB LIMITED                     [id number]    Raelyn Mora  2  [place] MFM FETAL BPP WO NON               [id number]    ROLITTA DAWSON     STRESS ----------------------------------------------------------------------  #  Order #                     Accession #                Episode #  1  [id number]                   [id number]                 [id number]  2  [id number]                   [id number]                 [id number] ---------------------------------------------------------------------- Indications  Vaginal bleeding  in pregnancy, third trimester O46.93  Preterm contractions                           O47.00  Advanced maternal age primigravida 42+,        O71.512  second trimester  Echogenic intracardiac focus of the heart      O35.8XX0  (EIF)  LR NIPS, Neg horizon  [redacted] weeks gestation of pregnancy                Z3A.35 ---------------------------------------------------------------------- Fetal Evaluation  Num Of Fetuses:         1  Fetal Heart Rate(bpm):  150  Cardiac Activity:       Observed  Presentation:           Cephalic  Placenta:               Posterior Fundal  P. Cord Insertion:      Visualized, central  Amniotic Fluid  AFI FV:      Within normal limits  AFI Sum(cm)     %Tile       Largest Pocket(cm)  10.3            24          3.6  RUQ(cm)       RLQ(cm)       LUQ(cm)        LLQ(cm)  1.8           3.3           3.6            1.6  Comment:    Small abruption noted on anterior edge of placenta 2.0 x 1.4x 3.0              cm. ---------------------------------------------------------------------- Biophysical Evaluation  Amniotic F.V:   Within normal limits       F. Tone:        Observed  F. Movement:    Observed                   Score:          8/8  F. Breathing:   Observed ---------------------------------------------------------------------- OB History  Gravidity:    6  Term:   4        Prem:   0        SAB:   1  TOP:          0       Ectopic:  0        Living: 4 ---------------------------------------------------------------------- Gestational Age  LMP:           36w 1d        Date:  03/23/22                   EDD:   12/28/22  Best:          Consuello Closs 5d     Det. By:  Marcella Dubs         EDD:   12/31/22                                      (06/30/22) ---------------------------------------------------------------------- Anatomy  Cranium:               Appears normal         Stomach:                Appears normal, left                                                                        sided  Thoracic:               Appears normal         Kidneys:                Appear normal  Diaphragm:             Appears normal         Bladder:                Appears normal ---------------------------------------------------------------------- Cervix Uterus Adnexa  Cervix  Not visualized (advanced GA >24wks)  Uterus  No abnormality visualized.  Right Ovary  Not visualized.  Left Ovary  Not visualized.  Cul De Sac  No free fluid seen.  Adnexa  No abnormality visualized ---------------------------------------------------------------------- Impression  Patient presented to MAU with complaints of vaginal  bleeding.  She reports she passed clots this morning.  Amniotic fluid is normal good fetal activity seen.  Cephalic  presentation.  Antenatal testing is reassuring.  BPP 8/8.  A  small hematoma is seen at the inferior edge of the placenta  (measurements above).  Impression: Placental abruption.  Discussed with MAU team. ---------------------------------------------------------------------- Recommendations  -Recommend inpatient observation for 48 hours.  -Informed no further bleeding occurs, delivery may be  considered at [redacted] weeks gestation. ----------------------------------------------------------------------                  Noralee Space, MD Electronically Signed Final Report   12/01/2022 03:21 pm ----------------------------------------------------------------------     Medications:  Scheduled  anakinra  100 mg Subcutaneous q AM   folic acid  500 mcg Oral Daily   predniSONE  10 mg Oral Q breakfast   prenatal multivitamin  1 tablet Oral Q1200   I have reviewed the patient's current  medications.  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

## 2022-12-02 NOTE — Progress Notes (Signed)
Postpartum tubal consent:  37 y.o. [id number]  with undesired fertility,status post vaginal delivery, expressed desire for permanent sterilization.  I reviewed with the patient her expressed plan for tubal sterilization.  We dicussed other reversible forms of contraception including LARC options with similar effectiveness of BTS. She expressed that she strongly desire permanent sterilization. She declines all other modalities. We discussed method of sterilization with the preferred method being salpingectomy. Risks of procedure discussed with patient including but not limited to: risk of regret, permanence of method, bleeding, infection, injury to surrounding organs and need for additional procedures.  Failure risk of 0.5-1% with increased risk of ectopic gestation if pregnancy occurs was also discussed with patient.    NPO since midnight Posted for 17:15PM with Dr. Jaclynn Major, MD

## 2022-12-02 NOTE — Lactation Note (Signed)
This note was copied from a baby's chart. Lactation Consultation Note  Patient Name: Melissa Little Today's Date: 12/02/2022 Age:38 hours Reason for consult: Initial assessment;Late-preterm 34-36.6wks;Infant < 6lbs RN stated she got the baby to latch for a few minutes. LC attempted to get baby to latch and she wasn't interested at all. Baby alert. LC attempted to give formula. Baby not interested. Tongue thrusting nipple out of mouth or chewing in it. Suckled 2 times and got choked both times. Using white nipple held baby straight upright. Tried to do upright side lying but baby wouldn't suckle. Mom in a lot of pain, cramping. RN gave mom pain medication. Newborn feeding habits, STS, I&O, positioning, support, props, LPI sheet and supplementation reviewed. Mom agrees to do pump but not right now because she is in a lot of pain from BTL today. Mom shown how to use DEBP & how to disassemble, clean, & reassemble parts. Encouraged mom to call for assistance or questions. Maternal Data Has patient been taught Hand Expression?: Yes Does the patient have breastfeeding experience prior to this delivery?: Yes How long did the patient breastfeed?: not long,  Feeding Nipple Type: Nfant Standard Flow (white)  LATCH Score Latch: Too sleepy or reluctant, no latch achieved, no sucking elicited.  Audible Swallowing: None  Type of Nipple: Everted at rest and after stimulation  Comfort (Breast/Nipple): Soft / non-tender  Hold (Positioning): Full assist, staff holds infant at breast  LATCH Score: 4   Lactation Tools Discussed/Used Tools: Pump;Flanges Flange Size: 21 Breast pump type: Double-Electric Breast Pump Pump Education: Setup, frequency, and cleaning;Milk Storage Reason for Pumping: LPI Pumping frequency: q 3hr  Interventions Interventions: Breast feeding basics reviewed;Assisted with latch;Skin to skin;Breast massage;Hand express;Breast compression;Adjust position;Support  pillows;Position options;DEBP;Education;Pace feeding;LC Psychologist, educational;Infant Driven Feeding Algorithm education;LPT handout/interventions  Discharge    Consult Status Consult Status: Follow-up Date: 12/03/22 Follow-up type: In-patient    Charyl Dancer 12/02/2022, 9:25 PM

## 2022-12-02 NOTE — Progress Notes (Signed)
OB Note 12/02/2022 10am CBC stable, as well as fetal monitoring. D/w her re: delivery route and patient desires to try for vaginal delivery. Will start stress dose steroids and PCN, leave NPO and move to L&D. D/w Dr. Alvester Morin.    Cornelia Copa MD Attending Center for [facility] (Faculty Practice) GYN Consult Phone: [phone] (M-F, [phone]) & [phone] (Off hours, weekends, holidays)

## 2022-12-02 NOTE — Progress Notes (Signed)
OB Note  Patient received doses of terb overnight, in addition to procardia and fentanyl; pt also had some lower amount but continued bleeding overnight.  I d/w Dr. Judeth Cornfield and he agrees proceeding with delivery.  Patient endorses contractions q57m and endorsed the bleeding overnight.  VS normal and stable Category II due to slight variables with contractions, ? Accels, mod variability Q46m toco NAD but mild distress with contraction while I was in the room SVE: 2-2.5/50/ballotable. scant old blood on glove  Bedside u/s: cephalic     Latest Ref Rng & Units 12/02/2022    4:38 AM 12/01/2022    3:36 PM 10/04/2022    4:32 AM  CBC  WBC 4.0 - 10.5 K/uL 11.7  12.0  19.8   Hemoglobin 12.0 - 15.0 g/dL 34.7  42.5  9.4   Hematocrit 36.0 - 46.0 % 32.5  36.7  29.0   Platelets 150 - 400 K/uL 201  191  147    A/P: pt with continued abruption and ?early labor I told her that I recommend proceeding with delivery. If H/H continues to show drop (patient is not and has not been on IVF) that I recommend proceeding with c-section. If H/H stable, then can consider vaginal delivery. With IOL, I told her that patient is at risk for having a worsened abruption and needing an emergency c/s for maternal or fetal indications. I also d/w her re: risks of c-section, as well  Stat CBC ordered and pt made NPO officially; pt has only has sips with meds today.   Patient will need stress dose with delivery regardless of route  Cornelia Copa MD Attending Center for [employer] (Faculty Practice) 12/02/2022 Time: 0900

## 2022-12-02 NOTE — Transfer of Care (Signed)
Immediate Anesthesia Transfer of Care Note  Patient: Melissa Little  Procedure(s) Performed: POST PARTUM TUBAL LIGATION (Bilateral)  Patient Location: PACU  Anesthesia Type:Epidural  Level of Consciousness: awake, alert , and oriented  Airway & Oxygen Therapy: Patient Spontanous Breathing  Post-op Assessment: Report given to RN and Post -op Vital signs reviewed and stable  Post vital signs: Reviewed and stable  Last Vitals:  Vitals Value Taken Time  BP 130/80 12/02/22 1836  Temp    Pulse 71 12/02/22 1838  Resp 16 12/02/22 1838  SpO2 97 % 12/02/22 1838  Vitals shown include unfiled device data.  Last Pain:  Vitals:   12/02/22 1700  TempSrc: Oral  PainSc: 0-No pain      Patients Stated Pain Goal: 4 (12/01/22 1627)  Complications: No notable events documented.

## 2022-12-02 NOTE — Anesthesia Preprocedure Evaluation (Signed)
Anesthesia Evaluation  Patient identified by MRN, date of birth, ID band Patient awake    Reviewed: Allergy & Precautions, NPO status , Patient's Chart, lab work & pertinent test results  History of Anesthesia Complications Negative for: history of anesthetic complications  Airway Mallampati: II   Neck ROM: Full    Dental   Pulmonary neg pulmonary ROS   Pulmonary exam normal        Cardiovascular negative cardio ROS Normal cardiovascular exam     Neuro/Psych  PSYCHIATRIC DISORDERS Anxiety     negative neurological ROS     GI/Hepatic negative GI ROS, Neg liver ROS,,,  Endo/Other  negative endocrine ROS    Renal/GU negative Renal ROS     Musculoskeletal  (+) Arthritis , Rheumatoid disorders,   Still's disease    Abdominal   Peds  Hematology  (+) Blood dyscrasia, anemia  Plt 194k    Anesthesia Other Findings   Reproductive/Obstetrics (+) Pregnancy  Placental abruption                              Anesthesia Physical Anesthesia Plan  ASA: 2  Anesthesia Plan: Epidural   Post-op Pain Management:    Induction:   PONV Risk Score and Plan: 2 and Treatment may vary due to age or medical condition  Airway Management Planned: Natural Airway  Additional Equipment: None  Intra-op Plan:   Post-operative Plan:   Informed Consent: I have reviewed the patients History and Physical, chart, labs and discussed the procedure including the risks, benefits and alternatives for the proposed anesthesia with the patient or authorized representative who has indicated his/her understanding and acceptance.       Plan Discussed with: Anesthesiologist  Anesthesia Plan Comments: (Labs reviewed. Platelets acceptable, patient not taking any blood thinning medications. Per RN, FHR tracing reported to be stable enough for sitting procedure. Risks and benefits discussed with patient, including PDPH,  backache, epidural hematoma, failed epidural, blood pressure changes, allergic reaction, and nerve injury. Patient expressed understanding and wished to proceed.)       Anesthesia Quick Evaluation

## 2022-12-03 ENCOUNTER — Encounter (HOSPITAL_COMMUNITY): Payer: Self-pay | Admitting: Obstetrics & Gynecology

## 2022-12-03 DIAGNOSIS — L0291 Cutaneous abscess, unspecified: Secondary | ICD-10-CM | POA: Insufficient documentation

## 2022-12-03 LAB — CULTURE, BETA STREP (GROUP B ONLY)

## 2022-12-03 LAB — CBC
HCT: 29.7 % — ABNORMAL LOW (ref 36.0–46.0)
Hemoglobin: 10.2 g/dL — ABNORMAL LOW (ref 12.0–15.0)
MCH: 28.9 pg (ref 26.0–34.0)
MCHC: 34.3 g/dL (ref 30.0–36.0)
MCV: 84.1 fL (ref 80.0–100.0)
Platelets: 189 10*3/uL (ref 150–400)
RBC: 3.53 MIL/uL — ABNORMAL LOW (ref 3.87–5.11)
RDW: 18 % — ABNORMAL HIGH (ref 11.5–15.5)
WBC: 12.7 10*3/uL — ABNORMAL HIGH (ref 4.0–10.5)
nRBC: 0 % (ref 0.0–0.2)

## 2022-12-03 MED ORDER — CLINDAMYCIN HCL 300 MG PO CAPS
450.0000 mg | ORAL_CAPSULE | Freq: Three times a day (TID) | ORAL | Status: DC
  Administered 2022-12-03 – 2022-12-04 (×3): 450 mg via ORAL
  Filled 2022-12-03 (×6): qty 1

## 2022-12-03 NOTE — Lactation Note (Signed)
This note was copied from a baby's chart. Lactation Consultation Note  Patient Name: Melissa Little Today's Date: 12/03/2022 Age:38 hours  Reason for consult: Follow-up assessment;1st time breastfeeding;Late-preterm 34-36.6wks;Infant < 6lbs;Infant weight loss  P5, [redacted]w[redacted]d, 5.24% weight loss,today: 2170g  (4 lbs 12 oz)  Follow up LC visit with P5 mother. Mother states she did not breastfeed her other children but recalls her milk coming. Mother is wanting to breastfeed this baby. Mother reports, she attempts to latch/nuzzle baby at the breast prior to formula feeding. SLP has worked with infant and baby is bottle feeding from Dr. Theora Gianotti bottle and tolerating well.  Mother has not been pumping and requesting a review. Instructed mother on the use, frequency, cleaning of breast pump and storage of breast milk. Flange size adjusted to 18 mm and coconut oil given. Mother informed if the 18 mm is too snug to change flange to 21 mm. Discussed the benefits of pumping every 3 hours to establish milk production and provide EBM.   Mother to continue with SLP feeding plan with paced-formula feeding.  Allow baby to nuzzle and latch with cues.  Encouraged to pump every 3 hours.  Feed baby mother's expressed milk  Feeding Mother's Current Feeding Choice: Breast Milk and Formula Nipple Type: Dr. Lorne Skeens   Lactation Tools Discussed/Used Tools: Pump;Flanges Flange Size: 18 Breast pump type: Double-Electric Breast Pump Reason for Pumping: LPI Pumping frequency: every 3 hrs for 15 min in the iniate setting  Interventions Interventions: DEBP;Education  Discharge Pump: Manual  Consult Status Consult Status: Follow-up Date: 12/04/22 Follow-up type: In-patient    Christella Hartigan M 12/03/2022, 5:57 PM

## 2022-12-03 NOTE — Anesthesia Postprocedure Evaluation (Signed)
Anesthesia Post Note  Patient: [place] Brickell  Procedure(s) Performed: POST PARTUM TUBAL LIGATION (Bilateral)     Patient location during evaluation: Mother Baby Anesthesia Type: Epidural Level of consciousness: awake and alert Pain management: pain level controlled Vital Signs Assessment: post-procedure vital signs reviewed and stable Respiratory status: spontaneous breathing, nonlabored ventilation and respiratory function stable Cardiovascular status: stable Postop Assessment: no headache, no backache and epidural receding Anesthetic complications: no   No notable events documented.  Last Vitals:  Vitals:   12/02/22 2105 12/03/22 0032  BP: 132/70 126/80  Pulse: 72 84  Resp: 18 18  Temp: 37.2 C   SpO2: 98% 99%    Last Pain:  Vitals:   12/03/22 0038  TempSrc:   PainSc: 2    Pain Goal: Patients Stated Pain Goal: 4 (12/01/22 1627)                 Zackry Deines

## 2022-12-03 NOTE — Anesthesia Postprocedure Evaluation (Signed)
Anesthesia Post Note  Patient: Melissa Little  Procedure(s) Performed: AN AD HOC LABOR EPIDURAL     Patient location during evaluation: Mother Baby Anesthesia Type: Epidural Level of consciousness: awake and alert Pain management: pain level controlled Vital Signs Assessment: post-procedure vital signs reviewed and stable Respiratory status: spontaneous breathing, nonlabored ventilation and respiratory function stable Cardiovascular status: stable Postop Assessment: no headache, no backache, epidural receding, no apparent nausea or vomiting, patient able to bend at knees, able to ambulate and adequate PO intake Anesthetic complications: no   No notable events documented.  Last Vitals:  Vitals:   12/03/22 0032 12/03/22 0527  BP: 126/80 132/78  Pulse: 84 79  Resp: 18 18  Temp: 36.7 C   SpO2: 99% 100%    Last Pain:  Vitals:   12/03/22 0619  TempSrc:   PainSc: 2    Pain Goal: Patients Stated Pain Goal: 4 (12/01/22 1627)                 Laban Emperor

## 2022-12-03 NOTE — Lactation Note (Signed)
This note was copied from a baby's chart. Lactation Consultation Note  Patient Name: Melissa Little Today's Date: 12/03/2022 Age:38 hours  LC asked RN how baby was doing and if she had fed. RN stated that baby has latched to breast ans has took 10 ml of formula from bottle and glucose was WDL.    Maternal Data    Feeding    LATCH Score                    Lactation Tools Discussed/Used    Interventions    Discharge    Consult Status      Charyl Dancer 12/03/2022, 2:42 AM

## 2022-12-03 NOTE — Progress Notes (Signed)
POSTPARTUM PROGRESS NOTE  Post Partum Day 1 Subjective:  Melissa Little is a 37 y.o. [id number] [redacted]w[redacted]d s/p nsvd.  No acute events overnight.  Pt denies problems with ambulating, voiding or po intake.  She denies nausea or vomiting.  Pain is well controlled.  She has had flatus. She has not had bowel movement.  Lochia Small.   Objective: Blood pressure 132/78, pulse 79, temperature 98 F (36.7 C), temperature source Oral, resp. rate 18, height 5\' 2"  (1.575 m), weight 63.5 kg, last menstrual period 03/23/2022, SpO2 100%, unknown if currently breastfeeding.  Physical Exam:  General: alert, cooperative and no distress Lochia:normal flow Chest: CTAB Heart: RRR no m/r/g Abdomen: +BS, soft, nontender, c/d/I dressing over umbilicus Uterine Fundus: firm,  DVT Evaluation: No calf swelling or tenderness Extremities: trace edema Skin: draining abscess posterior thigh 2 cm in diameter  Recent Labs    12/02/22 0908 12/03/22 0433  HGB 10.7* 10.2*  HCT 31.9* 29.7*    Assessment/Plan:  ASSESSMENT: Melissa Little is a 37 y.o. [id number] [redacted]w[redacted]d s/p nsvd, doing well. Pain controlled and bleeding is appropriate. She is s/p btl. 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 She has an abscess on her left superior posterior thigh that is spontaneously draining. It is relatively small. I will start her on oral clindamycin avoiding bactrim as she is breastfeeding. Plan for discharge tomorrow.    LOS: 2 days   Silvano Bilis 12/03/2022, 8:50 AM

## 2022-12-04 ENCOUNTER — Ambulatory Visit (HOSPITAL_COMMUNITY): Payer: Self-pay

## 2022-12-04 ENCOUNTER — Other Ambulatory Visit (HOSPITAL_COMMUNITY): Payer: Self-pay

## 2022-12-04 MED ORDER — IBUPROFEN 600 MG PO TABS
600.0000 mg | ORAL_TABLET | Freq: Four times a day (QID) | ORAL | 0 refills | Status: AC
  Filled 2022-12-04: qty 30, 8d supply, fill #0

## 2022-12-04 MED ORDER — CLINDAMYCIN HCL 150 MG PO CAPS
450.0000 mg | ORAL_CAPSULE | Freq: Three times a day (TID) | ORAL | 0 refills | Status: DC
  Filled 2022-12-04: qty 12, 2d supply, fill #0

## 2022-12-04 MED ORDER — SENNOSIDES-DOCUSATE SODIUM 8.6-50 MG PO TABS
2.0000 | ORAL_TABLET | ORAL | 0 refills | Status: AC
  Filled 2022-12-04: qty 60, 30d supply, fill #0

## 2022-12-04 MED ORDER — OXYCODONE HCL 5 MG PO TABS
5.0000 mg | ORAL_TABLET | Freq: Four times a day (QID) | ORAL | 0 refills | Status: AC | PRN
  Filled 2022-12-04: qty 10, 3d supply, fill #0

## 2022-12-04 MED ORDER — PREDNISONE 10 MG PO TABS
10.0000 mg | ORAL_TABLET | Freq: Every day | ORAL | 0 refills | Status: AC
  Filled 2022-12-04: qty 30, 30d supply, fill #0

## 2022-12-04 NOTE — Progress Notes (Signed)
CSW received consult for hx of Anxiety and Depression.  CSW met with [name] to offer support and complete assessment.    When CSW arrived [name] was bonding with infant as evidence by holding infant and engaging in infant massages; [name] and infant appeared happy comfortable.   CSW asked about [name]'s MH hx and [name] denied having any MH hx.  CSW asked about  a hx of anxiety, and [name] stated, "No, I've never had that or any symptoms." Although [name] denied having any MH hx, she was open to education and resources.   CSW provided education regarding the baby blues period vs. perinatal mood disorders, discussed treatment and gave resources for mental health follow up if concerns arise.  CSW recommends self-evaluation during the postpartum time period using the New Mom Checklist from Postpartum Progress and encouraged [name] to contact a medical professional if symptoms are noted at any time; [name] agreed   CSW identifies no further need for intervention and no barriers to discharge at this time.   Blaine Hamper, MSW, LCSW Clinical Social Work [phone]

## 2022-12-04 NOTE — Lactation Note (Signed)
This note was copied from a baby's chart. Lactation Consultation Note  Patient Name: Melissa Little Today's Date: 12/04/2022 Age:38 hours Reason for consult: Follow-up assessment;Infant weight loss;Late-preterm 34-36.6wks;Infant < 6lbs  P5- [name] reports that infant is eating well from both the breasts and the bottle. [name] also reports that she has been following the LPI/Low birth weight feeding guidelines to make sure infant is eating enough. [name] believes that her milk is starting to transition in because her breasts are fuller and she is starting to see volume when she pumps. LC congratulated [name] and praised her for her hard work.   LC reviewed the LPI/low birth weight feeding plan, CDC milk storage guidelines, LC services handout and engorgement/breast care. LC encouraged [name] to call for further assistance as needed.  Maternal Data Does the patient have breastfeeding experience prior to this delivery?: Yes  Feeding Mother's Current Feeding Choice: Breast Milk and Formula Nipple Type: Dr. Levert Feinstein Preemie  Lactation Tools Discussed/Used Tools: Pump;Flanges Breast pump type: Double-Electric Breast Pump;Manual Pump Education: Setup, frequency, and cleaning;Milk Storage Reason for Pumping: LPI/low birth weight baby Pumping frequency: 15-20 min every 2-3 hrs  Interventions Interventions: Breast feeding basics reviewed;Education;LC Services brochure  Discharge Discharge Education: Warning signs for feeding baby;Outpatient recommendation WIC Program: Yes  Consult Status Consult Status: Follow-up Date: 12/05/22 Follow-up type: In-patient    Dema Severin BS, IBCLC 12/04/2022, 7:19 PM

## 2022-12-05 ENCOUNTER — Ambulatory Visit (HOSPITAL_COMMUNITY): Payer: Self-pay

## 2022-12-05 LAB — BPAM RBC
Blood Product Expiration Date: 202412042359
Blood Product Expiration Date: 202412042359
Unit Type and Rh: 7300
Unit Type and Rh: 7300

## 2022-12-05 LAB — TYPE AND SCREEN
ABO/RH(D): B POS
Antibody Screen: NEGATIVE
Unit division: 0
Unit division: 0

## 2022-12-05 NOTE — Lactation Note (Signed)
This note was copied from a baby's chart. Lactation Consultation Note  Patient Name: Melissa Little Today's Date: 12/05/2022 Age:38 hours Reason for consult: Follow-up assessment;Maternal discharge;Late-preterm 34-36.6wks  P5: performed discharge education. Patient reports breastfeeding has been going well and she has been able to follow the LPI feeding guidelines. Patient agreed to receive outpatient lactation support and was educated on engorgement and breast care.    Maternal Data Has patient been taught Hand Expression?: Yes Does the patient have breastfeeding experience prior to this delivery?: No  Feeding Mother's Current Feeding Choice: Breast Milk and Formula   Lactation Tools Discussed/Used    Interventions Interventions: Breast feeding basics reviewed;Education  Discharge Discharge Education: Outpatient recommendation;Outpatient Epic message sent;Engorgement and breast care  Consult Status Consult Status: Complete Date: 12/05/22    [name] 12/05/2022, 10:04 AM

## 2022-12-06 LAB — SURGICAL PATHOLOGY

## 2022-12-13 ENCOUNTER — Telehealth: Payer: Self-pay | Admitting: *Deleted

## 2022-12-13 ENCOUNTER — Ambulatory Visit: Payer: Medicaid Other

## 2022-12-13 ENCOUNTER — Encounter: Payer: Self-pay | Admitting: *Deleted

## 2022-12-13 NOTE — Telephone Encounter (Signed)
[name]. I called and left a message notifying her she missed a scheduled appointment and we recommend she call and reschedule. I will also send a MyChart message. [name]

## 2022-12-14 ENCOUNTER — Telehealth (HOSPITAL_COMMUNITY): Payer: Self-pay | Admitting: *Deleted

## 2022-12-14 NOTE — Telephone Encounter (Signed)
12/14/2022  Name: [place] Thebeau MRN: [id number] DOB: Nov 28, 1985  Reason for Call:  Transition of Care Hospital Discharge Call  Contact Status: Patient Contact Status: Message  Language assistant needed:          Follow-Up Questions:    Inocente Salles Postnatal Depression Scale:  In the Past 7 Days:    PHQ2-9 Depression Scale:     Discharge Follow-up:    Post-discharge interventions: NA  Salena Saner, RN 12/14/2022 10:18

## 2023-04-14 ENCOUNTER — Inpatient Hospital Stay (HOSPITAL_COMMUNITY)
Admission: EM | Admit: 2023-04-14 | Discharge: 2023-04-21 | DRG: 177 | Disposition: A | Discharge status: Other Acute Inpatient Hospital | Attending: Internal Medicine | Admitting: Internal Medicine

## 2023-04-14 ENCOUNTER — Other Ambulatory Visit: Payer: Self-pay

## 2023-04-14 ENCOUNTER — Emergency Department (HOSPITAL_COMMUNITY)

## 2023-04-14 ENCOUNTER — Encounter (HOSPITAL_COMMUNITY): Payer: Self-pay

## 2023-04-14 DIAGNOSIS — E8809 Other disorders of plasma-protein metabolism, not elsewhere classified: Secondary | ICD-10-CM | POA: Diagnosis present

## 2023-04-14 DIAGNOSIS — D72829 Elevated white blood cell count, unspecified: Secondary | ICD-10-CM | POA: Diagnosis not present

## 2023-04-14 DIAGNOSIS — R7402 Elevation of levels of lactic acid dehydrogenase (LDH): Secondary | ICD-10-CM | POA: Diagnosis not present

## 2023-04-14 DIAGNOSIS — R6510 Systemic inflammatory response syndrome (SIRS) of non-infectious origin without acute organ dysfunction: Secondary | ICD-10-CM

## 2023-04-14 DIAGNOSIS — B9620 Unspecified Escherichia coli [E. coli] as the cause of diseases classified elsewhere: Secondary | ICD-10-CM | POA: Diagnosis present

## 2023-04-14 DIAGNOSIS — E871 Hypo-osmolality and hyponatremia: Secondary | ICD-10-CM | POA: Diagnosis present

## 2023-04-14 DIAGNOSIS — N390 Urinary tract infection, site not specified: Secondary | ICD-10-CM | POA: Diagnosis present

## 2023-04-14 DIAGNOSIS — F419 Anxiety disorder, unspecified: Secondary | ICD-10-CM | POA: Diagnosis present

## 2023-04-14 DIAGNOSIS — E2740 Unspecified adrenocortical insufficiency: Secondary | ICD-10-CM | POA: Diagnosis present

## 2023-04-14 DIAGNOSIS — E43 Unspecified severe protein-calorie malnutrition: Secondary | ICD-10-CM | POA: Diagnosis present

## 2023-04-14 DIAGNOSIS — G9341 Metabolic encephalopathy: Secondary | ICD-10-CM | POA: Diagnosis present

## 2023-04-14 DIAGNOSIS — G9340 Encephalopathy, unspecified: Secondary | ICD-10-CM | POA: Diagnosis not present

## 2023-04-14 DIAGNOSIS — K529 Noninfective gastroenteritis and colitis, unspecified: Secondary | ICD-10-CM | POA: Diagnosis present

## 2023-04-14 DIAGNOSIS — D65 Disseminated intravascular coagulation [defibrination syndrome]: Secondary | ICD-10-CM | POA: Diagnosis present

## 2023-04-14 DIAGNOSIS — N3000 Acute cystitis without hematuria: Secondary | ICD-10-CM | POA: Diagnosis not present

## 2023-04-14 DIAGNOSIS — K760 Fatty (change of) liver, not elsewhere classified: Secondary | ICD-10-CM | POA: Diagnosis present

## 2023-04-14 DIAGNOSIS — Z833 Family history of diabetes mellitus: Secondary | ICD-10-CM

## 2023-04-14 DIAGNOSIS — E781 Pure hyperglyceridemia: Secondary | ICD-10-CM | POA: Diagnosis not present

## 2023-04-14 DIAGNOSIS — D688 Other specified coagulation defects: Secondary | ICD-10-CM | POA: Diagnosis not present

## 2023-04-14 DIAGNOSIS — J90 Pleural effusion, not elsewhere classified: Secondary | ICD-10-CM | POA: Diagnosis present

## 2023-04-14 DIAGNOSIS — R188 Other ascites: Secondary | ICD-10-CM | POA: Diagnosis present

## 2023-04-14 DIAGNOSIS — Z7952 Long term (current) use of systemic steroids: Secondary | ICD-10-CM

## 2023-04-14 DIAGNOSIS — E861 Hypovolemia: Secondary | ICD-10-CM | POA: Diagnosis present

## 2023-04-14 DIAGNOSIS — E872 Acidosis, unspecified: Secondary | ICD-10-CM | POA: Diagnosis present

## 2023-04-14 DIAGNOSIS — D509 Iron deficiency anemia, unspecified: Secondary | ICD-10-CM | POA: Diagnosis present

## 2023-04-14 DIAGNOSIS — E860 Dehydration: Secondary | ICD-10-CM | POA: Diagnosis present

## 2023-04-14 DIAGNOSIS — D696 Thrombocytopenia, unspecified: Secondary | ICD-10-CM | POA: Diagnosis not present

## 2023-04-14 DIAGNOSIS — U071 COVID-19: Principal | ICD-10-CM | POA: Diagnosis present

## 2023-04-14 DIAGNOSIS — B954 Other streptococcus as the cause of diseases classified elsewhere: Secondary | ICD-10-CM | POA: Diagnosis present

## 2023-04-14 DIAGNOSIS — R601 Generalized edema: Secondary | ICD-10-CM | POA: Diagnosis not present

## 2023-04-14 DIAGNOSIS — D3502 Benign neoplasm of left adrenal gland: Secondary | ICD-10-CM | POA: Diagnosis present

## 2023-04-14 DIAGNOSIS — M061 Adult-onset Still's disease: Secondary | ICD-10-CM | POA: Diagnosis present

## 2023-04-14 DIAGNOSIS — Z825 Family history of asthma and other chronic lower respiratory diseases: Secondary | ICD-10-CM | POA: Diagnosis not present

## 2023-04-14 DIAGNOSIS — Z6823 Body mass index (BMI) 23.0-23.9, adult: Secondary | ICD-10-CM

## 2023-04-14 DIAGNOSIS — E876 Hypokalemia: Secondary | ICD-10-CM | POA: Diagnosis present

## 2023-04-14 DIAGNOSIS — R627 Adult failure to thrive: Secondary | ICD-10-CM | POA: Diagnosis present

## 2023-04-14 DIAGNOSIS — D761 Hemophagocytic lymphohistiocytosis: Secondary | ICD-10-CM | POA: Diagnosis present

## 2023-04-14 DIAGNOSIS — D721 Eosinophilia, unspecified: Secondary | ICD-10-CM | POA: Diagnosis not present

## 2023-04-14 DIAGNOSIS — D849 Immunodeficiency, unspecified: Secondary | ICD-10-CM | POA: Diagnosis present

## 2023-04-14 DIAGNOSIS — E538 Deficiency of other specified B group vitamins: Secondary | ICD-10-CM | POA: Diagnosis present

## 2023-04-14 DIAGNOSIS — R7989 Other specified abnormal findings of blood chemistry: Secondary | ICD-10-CM | POA: Diagnosis not present

## 2023-04-14 LAB — URINALYSIS, ROUTINE W REFLEX MICROSCOPIC
Bilirubin Urine: NEGATIVE
Glucose, UA: NEGATIVE mg/dL
Ketones, ur: NEGATIVE mg/dL
Nitrite: NEGATIVE
Protein, ur: 100 mg/dL — AB
Specific Gravity, Urine: 1.015 (ref 1.005–1.030)
pH: 6 (ref 5.0–8.0)

## 2023-04-14 LAB — SODIUM, URINE, RANDOM: Sodium, Ur: 25 mmol/L

## 2023-04-14 LAB — CBC WITH DIFFERENTIAL/PLATELET
Abs Immature Granulocytes: 0 10*3/uL (ref 0.00–0.07)
Band Neutrophils: 18 %
Basophils Absolute: 0.2 10*3/uL — ABNORMAL HIGH (ref 0.0–0.1)
Basophils Relative: 1 %
Eosinophils Absolute: 1.5 10*3/uL — ABNORMAL HIGH (ref 0.0–0.5)
Eosinophils Relative: 7 %
HCT: 39.1 % (ref 36.0–46.0)
Hemoglobin: 13 g/dL (ref 12.0–15.0)
Lymphocytes Relative: 1 %
Lymphs Abs: 0.2 10*3/uL — ABNORMAL LOW (ref 0.7–4.0)
MCH: 25 pg — ABNORMAL LOW (ref 26.0–34.0)
MCHC: 33.2 g/dL (ref 30.0–36.0)
MCV: 75 fL — ABNORMAL LOW (ref 80.0–100.0)
Monocytes Absolute: 0.2 10*3/uL (ref 0.1–1.0)
Monocytes Relative: 1 %
Neutro Abs: 19.8 10*3/uL — ABNORMAL HIGH (ref 1.7–7.7)
Neutrophils Relative %: 72 %
Platelets: 84 10*3/uL — ABNORMAL LOW (ref 150–400)
RBC: 5.21 MIL/uL — ABNORMAL HIGH (ref 3.87–5.11)
RDW: 19.7 % — ABNORMAL HIGH (ref 11.5–15.5)
Smear Review: NORMAL
WBC: 22 10*3/uL — ABNORMAL HIGH (ref 4.0–10.5)
nRBC: 0 % (ref 0.0–0.2)

## 2023-04-14 LAB — RESP PANEL BY RT-PCR (RSV, FLU A&B, COVID)  RVPGX2
Influenza A by PCR: NEGATIVE
Influenza B by PCR: NEGATIVE
Resp Syncytial Virus by PCR: NEGATIVE
SARS Coronavirus 2 by RT PCR: POSITIVE — AB

## 2023-04-14 LAB — COMPREHENSIVE METABOLIC PANEL WITH GFR
ALT: 25 U/L (ref 0–44)
AST: 109 U/L — ABNORMAL HIGH (ref 15–41)
Albumin: 1.6 g/dL — ABNORMAL LOW (ref 3.5–5.0)
Alkaline Phosphatase: 128 U/L — ABNORMAL HIGH (ref 38–126)
Anion gap: 11 (ref 5–15)
BUN: 8 mg/dL (ref 6–20)
CO2: 21 mmol/L — ABNORMAL LOW (ref 22–32)
Calcium: 6.9 mg/dL — ABNORMAL LOW (ref 8.9–10.3)
Chloride: 95 mmol/L — ABNORMAL LOW (ref 98–111)
Creatinine, Ser: 0.65 mg/dL (ref 0.44–1.00)
GFR, Estimated: >60 mL/min (ref >60)
Glucose, Bld: 78 mg/dL (ref 70–99)
Potassium: 3.2 mmol/L — ABNORMAL LOW (ref 3.5–5.1)
Sodium: 127 mmol/L — ABNORMAL LOW (ref 135–145)
Total Bilirubin: 0.4 mg/dL (ref 0.0–1.2)
Total Protein: 5.5 g/dL — ABNORMAL LOW (ref 6.5–8.1)

## 2023-04-14 LAB — I-STAT CG4 LACTIC ACID, ED
Lactic Acid, Venous: 4.3 mmol/L (ref 0.5–1.9)
Lactic Acid, Venous: 2 mmol/L (ref 0.5–1.9)

## 2023-04-14 LAB — CREATININE, URINE, RANDOM: Creatinine, Urine: 111 mg/dL

## 2023-04-14 LAB — MAGNESIUM: Magnesium: 1.4 mg/dL — ABNORMAL LOW (ref 1.7–2.4)

## 2023-04-14 LAB — C-REACTIVE PROTEIN: CRP: 9.2 mg/dL — ABNORMAL HIGH (ref <1.0)

## 2023-04-14 LAB — OSMOLALITY: Osmolality: 276 mosm/kg (ref 275–295)

## 2023-04-14 LAB — OSMOLALITY, URINE: Osmolality, Ur: 406 mosm/kg (ref 300–900)

## 2023-04-14 MED ORDER — ONDANSETRON HCL 4 MG/2ML IJ SOLN: 4.0000 mg | Freq: Four times a day (QID) | INTRAMUSCULAR | Status: DC | PRN

## 2023-04-14 MED ORDER — POTASSIUM CHLORIDE 10 MEQ/100ML IV SOLN
10.0000 meq | INTRAVENOUS | Status: AC
  Administered 2023-04-14 (×4): 10 meq via INTRAVENOUS
  Filled 2023-04-14 (×4): qty 100

## 2023-04-14 MED ORDER — ACETAMINOPHEN 325 MG PO TABS
650.0000 mg | ORAL_TABLET | Freq: Four times a day (QID) | ORAL | Status: DC | PRN
  Administered 2023-04-15 – 2023-04-19 (×6): 650 mg via ORAL
  Filled 2023-04-14 (×8): qty 2

## 2023-04-14 MED ORDER — VANCOMYCIN HCL IN DEXTROSE 750-5 MG/150ML-% IV SOLN
750.0000 mg | Freq: Two times a day (BID) | INTRAVENOUS | Status: DC
  Filled 2023-04-14 (×3): qty 150

## 2023-04-14 MED ORDER — LEVALBUTEROL HCL 0.63 MG/3ML IN NEBU: 0.6300 mg | INHALATION_SOLUTION | RESPIRATORY_TRACT | Status: DC | PRN

## 2023-04-14 MED ORDER — SODIUM CHLORIDE 0.9 % IV BOLUS
1000.0000 mL | Freq: Once | INTRAVENOUS | Status: AC
  Administered 2023-04-14: 1000 mL via INTRAVENOUS

## 2023-04-14 MED ORDER — SENNOSIDES-DOCUSATE SODIUM 8.6-50 MG PO TABS
1.0000 | ORAL_TABLET | Freq: Two times a day (BID) | ORAL | Status: DC
  Administered 2023-04-16 – 2023-04-17 (×4): 1 via ORAL
  Filled 2023-04-14 (×11): qty 1

## 2023-04-14 MED ORDER — ANAKINRA 100 MG/0.67ML ~~LOC~~ SOSY: 100.0000 mg | PREFILLED_SYRINGE | SUBCUTANEOUS | Status: DC

## 2023-04-14 MED ORDER — OXYCODONE HCL 5 MG PO TABS
5.0000 mg | ORAL_TABLET | Freq: Four times a day (QID) | ORAL | Status: DC | PRN
  Administered 2023-04-15 – 2023-04-19 (×7): 5 mg via ORAL
  Filled 2023-04-14 (×7): qty 1

## 2023-04-14 MED ORDER — ENOXAPARIN SODIUM 40 MG/0.4ML IJ SOSY: 40.0000 mg | PREFILLED_SYRINGE | INTRAMUSCULAR | Status: DC

## 2023-04-14 MED ORDER — FOLIC ACID 1 MG PO TABS
500.0000 ug | ORAL_TABLET | Freq: Every day | ORAL | Status: DC
  Administered 2023-04-15: 0.5 mg via ORAL
  Filled 2023-04-14: qty 1

## 2023-04-14 MED ORDER — ACETAMINOPHEN 650 MG RE SUPP: 650.0000 mg | Freq: Four times a day (QID) | RECTAL | Status: DC | PRN

## 2023-04-14 MED ORDER — SODIUM CHLORIDE 0.9 % IV SOLN
2.0000 g | Freq: Three times a day (TID) | INTRAVENOUS | Status: DC
  Administered 2023-04-14 – 2023-04-16 (×6): 2 g via INTRAVENOUS
  Filled 2023-04-14 (×6): qty 12.5

## 2023-04-14 MED ORDER — KETOROLAC TROMETHAMINE 30 MG/ML IJ SOLN: 30.0000 mg | Freq: Once | INTRAMUSCULAR | Status: DC

## 2023-04-14 MED ORDER — VANCOMYCIN HCL 1.25 G IV SOLR
1250.0000 mg | Freq: Once | INTRAVENOUS | Status: AC
  Administered 2023-04-14: 1250 mg via INTRAVENOUS
  Filled 2023-04-14: qty 25

## 2023-04-14 MED ORDER — SODIUM CHLORIDE 0.9 % IV SOLN: INTRAVENOUS | Status: DC

## 2023-04-14 MED ORDER — METHOCARBAMOL 1000 MG/10ML IJ SOLN: 500.0000 mg | Freq: Four times a day (QID) | INTRAMUSCULAR | Status: DC | PRN

## 2023-04-14 MED ORDER — NIRMATRELVIR/RITONAVIR (PAXLOVID)TABLET
3.0000 | ORAL_TABLET | Freq: Two times a day (BID) | ORAL | Status: AC
  Administered 2023-04-14 – 2023-04-18 (×9): 3 via ORAL
  Filled 2023-04-14: qty 30

## 2023-04-14 MED ORDER — ONDANSETRON HCL 4 MG/2ML IJ SOLN
4.0000 mg | Freq: Four times a day (QID) | INTRAMUSCULAR | Status: DC | PRN
  Administered 2023-04-14 – 2023-04-19 (×2): 4 mg via INTRAVENOUS
  Filled 2023-04-14 (×2): qty 2

## 2023-04-14 MED ORDER — PANTOPRAZOLE SODIUM 40 MG IV SOLR
40.0000 mg | INTRAVENOUS | Status: DC
  Administered 2023-04-14 – 2023-04-20 (×7): 40 mg via INTRAVENOUS
  Filled 2023-04-14 (×7): qty 10

## 2023-04-14 MED ORDER — HYDRALAZINE HCL 20 MG/ML IJ SOLN: 10.0000 mg | Freq: Four times a day (QID) | INTRAMUSCULAR | Status: DC | PRN

## 2023-04-14 MED ORDER — SODIUM CHLORIDE 0.9% FLUSH
3.0000 mL | Freq: Two times a day (BID) | INTRAVENOUS | Status: DC
  Administered 2023-04-14 – 2023-04-20 (×10): 3 mL via INTRAVENOUS

## 2023-04-14 MED ORDER — PREDNISONE 20 MG PO TABS
20.0000 mg | ORAL_TABLET | Freq: Every day | ORAL | Status: DC
  Administered 2023-04-15 – 2023-04-16 (×2): 20 mg via ORAL
  Filled 2023-04-14 (×2): qty 1

## 2023-04-14 MED ORDER — SORBITOL 70 % SOLN: 30.0000 mL | Freq: Every day | Status: DC | PRN

## 2023-04-14 MED ORDER — MAGNESIUM CITRATE PO SOLN: 1.0000 | Freq: Once | ORAL | Status: DC | PRN

## 2023-04-14 MED ORDER — KETOROLAC TROMETHAMINE 30 MG/ML IJ SOLN
15.0000 mg | Freq: Once | INTRAMUSCULAR | Status: AC
  Administered 2023-04-14: 15 mg via INTRAVENOUS
  Filled 2023-04-14: qty 1

## 2023-04-14 MED ORDER — KETOROLAC TROMETHAMINE 30 MG/ML IJ SOLN
30.0000 mg | Freq: Four times a day (QID) | INTRAMUSCULAR | Status: AC | PRN
  Administered 2023-04-14: 30 mg via INTRAVENOUS
  Filled 2023-04-14: qty 1

## 2023-04-14 MED ORDER — ONDANSETRON HCL 4 MG PO TABS: 4.0000 mg | ORAL_TABLET | Freq: Four times a day (QID) | ORAL | Status: DC | PRN

## 2023-04-14 MED ORDER — ONDANSETRON HCL 4 MG/2ML IJ SOLN
4.0000 mg | Freq: Once | INTRAMUSCULAR | Status: AC
  Administered 2023-04-14: 4 mg via INTRAVENOUS
  Filled 2023-04-14: qty 2

## 2023-04-14 MED ORDER — ACETAMINOPHEN 500 MG PO TABS
1000.0000 mg | ORAL_TABLET | Freq: Once | ORAL | Status: AC
  Administered 2023-04-14: 1000 mg via ORAL
  Filled 2023-04-14: qty 2

## 2023-04-14 NOTE — ED Triage Notes (Signed)
 Pt BIB [facility] from home with c/o SHOB, flu-like symptoms, decreased appetite x3 days. Pt is tachy at 116 and tachypneic at 26 RR per EMS. BP 118/76, temp 99.1. NS and 4mg  Zofran given by EMS.

## 2023-04-14 NOTE — Progress Notes (Signed)
 Pharmacy Antibiotic Note  Melissa Little is a 38 y.o. female admitted on 04/14/2023 with sepsis.  Pharmacy has been consulted for cefepime and vancomycin dosing.  Patient presented with body aches, N/V, and shortness of breath. Was found to be COVID positive. 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 Lactic acid was initially 4.2 but improved to 2.0 following fluid resuscitation. WBC elevated to 22, band neutrophils 18, afebrile. In setting on immunosuppression, patient is being started on broad spectrum antibiotics.   Plan: Cefepime 2 grams IV Q8h Vancomycin 1250 mg IV x 1 dose followed by 750 mg IV Q12h (eAUC 570, Vd 0.72, Scr 0.8) Monitor cultures, renal function, and obtain vancomycin levels as indicated      Temp (24hrs), Avg:97.7 F (36.5 C), Min:97.5 F (36.4 C), Max:97.9 F (36.6 C)  Recent Labs  Lab 04/14/23 1237 04/14/23 1304 04/14/23 1456  WBC 22.0*  --   --   CREATININE 0.65  --   --   LATICACIDVEN  --  4.3* 2.0*    CrCl cannot be calculated (Unknown ideal weight.).    No Known Allergies  Antimicrobials this admission: Cefepime 3/27 >> Vancomycin 3/27 >>  Dose adjustments this admission: N/A  Microbiology results: 3/27 BCx: pending 3/27 UCx: pending   Thank you for allowing pharmacy to be a part of this patient's care.  Lennie Muckle, PharmD PGY1 Pharmacy Resident 04/14/2023 5:03 PM

## 2023-04-14 NOTE — ED Notes (Signed)
 Attempted pt IV x2 unsuccessfully. Phlebotomy contacted for blood culture draw.

## 2023-04-14 NOTE — ED Notes (Signed)
 Patient's istat lactic acid of 4.27 was called to [place], [place].

## 2023-04-14 NOTE — ED Provider Notes (Signed)
 [place] [facility] Provider Note   CSN: [id number] Arrival date & time: 04/14/23  1219     History  Chief Complaint  Patient presents with   Nausea   Shortness of Breath    Melissa Little is a 38 y.o. female.  Melissa Little is a 38 y.o. female with a history of rheumatoid arthritis and stills disease, who presents to the ED via EMS for evaluation of chills, body aches, cough, congestion, shortness of breath, nausea and vomiting.  Symptoms have been present for 3 days.  With EMS patient was noted to be tachycardic and mildly tachypneic but afebrile and normotensive.  Patient received 500 mL of normal saline and 4 mg of Zofran with EMS.  Patient denies any known sick contacts.  She reports severe generalized body aches that feel different than her rheumatoid arthritis but no focal pain.  She reports she has had nausea and vomiting and not been able to keep much of anything down over the past 3 days but denies abdominal pain.  Reports sore throat and hoarse voice as well as cough.  Patient is on chronic daily steroids as well as anakinra for management of her rheumatologic disease.  The history is provided by the patient and medical records.  Shortness of Breath Associated symptoms: cough, sore throat and vomiting   Associated symptoms: no abdominal pain, no chest pain, no fever, no headaches and no neck pain        Home Medications Prior to Admission medications   Medication Sig Start Date End Date Taking? Authorizing Provider  anakinra Reita Cliche) 100 MG/0.67ML SOSY injection Inject 100 mg into the vein daily.   Yes [provider]  folic acid (FOLVITE) 400 MCG tablet Take 400 mcg by mouth daily.   Yes [provider]  ibuprofen (ADVIL) 600 MG tablet Take 1 tablet (600 mg total) by mouth every 6 (six) hours. 12/04/22  Yes Sundra Aland, MD  oxyCODONE (OXY IR/ROXICODONE) 5 MG immediate release tablet Take 1 tablet (5 mg total) by  mouth every 6 (six) hours as needed for severe pain (pain score 7-10) or breakthrough pain. 12/04/22  Yes Sundra Aland, MD  predniSONE (DELTASONE) 10 MG tablet Take 1 tablet (10 mg total) by mouth daily with breakfast. 12/04/22  Yes Sundra Aland, MD  senna-docusate (SENOKOT-S) 8.6-50 MG tablet Take 2 tablets by mouth daily. Patient not taking: Reported on 04/14/2023 12/04/22   Sundra Aland, MD      Allergies    Patient has no known allergies.    Review of Systems   Review of Systems  Constitutional:  Positive for chills. Negative for fever.  HENT:  Positive for congestion, rhinorrhea and sore throat.   Respiratory:  Positive for cough and shortness of breath.   Cardiovascular:  Negative for chest pain.  Gastrointestinal:  Positive for nausea and vomiting. Negative for abdominal pain.  Musculoskeletal:  Positive for myalgias. Negative for neck pain and neck stiffness.  Neurological:  Negative for headaches.  All other systems reviewed and are negative.   Physical Exam Updated Vital Signs BP 118/77 (BP Location: Right Arm)   Pulse (!) 108   Temp 97.9 F (36.6 C) (Oral) Comment: Simultaneous filing. User may not have seen previous data. Comment (Src): Simultaneous filing. User may not have seen previous data.  Resp (!) 23   SpO2 98%  Physical Exam Vitals and nursing note reviewed.  Constitutional:      General: She is not in acute  distress.    Appearance: Normal appearance. She is well-developed. She is ill-appearing. She is not diaphoretic.     Comments: Alert, somewhat ill-appearing but in no acute distress  HENT:     Head: Normocephalic and atraumatic.     Nose: Congestion and rhinorrhea present.     Mouth/Throat:     Mouth: Mucous membranes are dry.     Pharynx: Oropharynx is clear. No oropharyngeal exudate or posterior oropharyngeal erythema.     Comments: Mucous membranes somewhat dry, posterior oropharynx clear without erythema, edema or exudates Eyes:      General:        Right eye: No discharge.        Left eye: No discharge.     Pupils: Pupils are equal, round, and reactive to light.  Cardiovascular:     Rate and Rhythm: Regular rhythm. Tachycardia present.     Pulses: Normal pulses.     Heart sounds: Normal heart sounds.     Comments: Tachycardia in the 110s with regular rhythm Pulmonary:     Effort: Pulmonary effort is normal. No respiratory distress.     Breath sounds: Normal breath sounds. No wheezing or rales.     Comments: Respirations equal and unlabored, patient able to speak in full sentences, lungs clear to auscultation bilaterally  Abdominal:     General: Bowel sounds are normal. There is no distension.     Palpations: Abdomen is soft. There is no mass.     Tenderness: There is no abdominal tenderness. There is no guarding.     Comments: Abdomen soft, nondistended, nontender to palpation in all quadrants without guarding or peritoneal signs  Musculoskeletal:        General: No deformity.     Cervical back: Neck supple.  Skin:    General: Skin is warm and dry.     Capillary Refill: Capillary refill takes less than 2 seconds.  Neurological:     Mental Status: She is alert and oriented to person, place, and time.     Coordination: Coordination normal.     Comments: Speech is clear, able to follow commands Moves extremities without ataxia, coordination intact  Psychiatric:        Mood and Affect: Mood normal.        Behavior: Behavior normal.     ED Results / Procedures / Treatments   Labs (all labs ordered are listed, but only abnormal results are displayed) Labs Reviewed  RESP PANEL BY RT-PCR (RSV, FLU A&B, COVID)  RVPGX2 - Abnormal; Notable for the following components:      Result Value   SARS Coronavirus 2 by RT PCR POSITIVE (*)    All other components within normal limits  COMPREHENSIVE METABOLIC PANEL WITH GFR - Abnormal; Notable for the following components:   Sodium 127 (*)    Potassium 3.2 (*)     Chloride 95 (*)    CO2 21 (*)    Calcium 6.9 (*)    Total Protein 5.5 (*)    Albumin 1.6 (*)    AST 109 (*)    Alkaline Phosphatase 128 (*)    All other components within normal limits  CBC WITH DIFFERENTIAL/PLATELET - Abnormal; Notable for the following components:   WBC 22.0 (*)    RBC 5.21 (*)    MCV 75.0 (*)    MCH 25.0 (*)    RDW 19.7 (*)    Platelets 84 (*)    Neutro Abs 19.8 (*)  Lymphs Abs 0.2 (*)    Eosinophils Absolute 1.5 (*)    Basophils Absolute 0.2 (*)    All other components within normal limits  I-STAT CG4 LACTIC ACID, ED - Abnormal; Notable for the following components:   Lactic Acid, Venous 4.3 (*)    All other components within normal limits  I-STAT CG4 LACTIC ACID, ED - Abnormal; Notable for the following components:   Lactic Acid, Venous 2.0 (*)    All other components within normal limits  CULTURE, BLOOD (ROUTINE X 2)  CULTURE, BLOOD (ROUTINE X 2)  URINE CULTURE  URINALYSIS, ROUTINE W REFLEX MICROSCOPIC  POC URINE PREG, ED    EKG EKG Interpretation Date/Time:  Thursday April 14 2023 12:21:15 EDT Ventricular Rate:  120 PR Interval:  108 QRS Duration:  88 QT Interval:  331 QTC Calculation: 468 R Axis:   81  Text Interpretation: Sinus tachycardia Abnormal ECG Confirmed by Gerhard Munch [phone]) on 04/14/2023 2:36:34 PM  Radiology DG Chest 2 View Result Date: 04/14/2023 CLINICAL DATA:  cough, rhonchi, possible flu/PNA. EXAM: CHEST - 2 VIEW COMPARISON:  09/28/2022. FINDINGS: Bilateral lung fields are clear. Bilateral costophrenic angles are clear. Normal cardio-mediastinal silhouette. No acute osseous abnormalities. The soft tissues are within normal limits. IMPRESSION: No active cardiopulmonary disease. Electronically Signed   By: Jules Schick M.D.   On: 04/14/2023 13:38    Procedures Procedures    Medications Ordered in ED Medications  acetaminophen (TYLENOL) tablet 1,000 mg (1,000 mg Oral Given 04/14/23 1255)  sodium chloride 0.9 % bolus  1,000 mL (0 mLs Intravenous Stopped 04/14/23 1455)  ondansetron (ZOFRAN) injection 4 mg (4 mg Intravenous Given 04/14/23 1258)  sodium chloride 0.9 % bolus 1,000 mL (1,000 mLs Intravenous New Bag/Given 04/14/23 1441)  ketorolac (TORADOL) 30 MG/ML injection 15 mg (15 mg Intravenous Given 04/14/23 1447)    ED Course/ Medical Decision Making/ A&P                               Medical Decision Making Amount and/or Complexity of Data Reviewed Labs: ordered. Radiology: ordered.  Risk OTC drugs. Prescription drug management. Decision regarding hospitalization.   38 y.o. female presents to the ED with complaints of chills, body aches, sore throat, cough, shortness of breath, nausea, vomiting, this involves an extensive number of treatment options, and is a complaint that carries with it a high risk of complications and morbidity.  The differential diagnosis includes COVID flu, other viral illness, sepsis, pneumonia, UTI, viral gastroenteritis, bacteremia  On arrival pt is nontoxic, vitals significant for tachycardia and mild tachypnea but patient is afebrile and normotensive, not requiring any supplemental oxygen. Exam significant for dry mucous membranes and signs of dehydration  Additional history obtained from chart review. Previous records obtained and reviewed   I ordered medication including Tylenol, Zofran, Toradol and 2 L IV fluid bolus  Lab Tests:  I Ordered, reviewed, and interpreted labs, which included: Respiratory panel positive for COVID-19 infection, lactic acid of 4.27, although I suspect that dehydration is driving a large amount of this given that the patient remains normotensive and afebrile I have lower suspicion for severe sepsis.  Patient with leukocytosis of 22 which I suspect is likely multifactorial in the setting of chronic steroids ongoing vomiting, dehydration and infection, normal hemoglobin, patient with hyponatremia of 127, mild hypokalemia of 3.2, creatinine and BUN  are within normal limits, AST and alk phos mildly elevated at 109 and 128.  Blood cultures, urinalysis and urine culture pending  Imaging Studies ordered:  I ordered imaging studies which included chest x-ray, I independently visualized and interpreted imaging which showed no evidence of pneumonia  ED Course:   Critical interventions: IV fluid bolus  With IV fluids and symptom management patient's tachycardia is improving and she reports pain body aches are improving as well.  I considered antibiotics but feel that symptoms and presentation is more consistent with viral illness, patient does have blood cultures, urinalysis and culture pending and if she is not continuing to improve given underlying immunocompromise I would have low threshold to initiate antibiotic therapy.  I consulted Dr. Ramiro Harvest with [facility] and discussed lab and imaging findings, will see and admit the patient for treatment of COVID-19 infection and dehydration with electrolyte derangements.    Portions of this note were generated with Scientist, clinical (histocompatibility and immunogenetics). Dictation errors may occur despite best attempts at proofreading.         Final Clinical Impression(s) / ED Diagnoses Final diagnoses:  COVID-19 virus infection    Rx / DC Orders ED Discharge Orders     None         Velda Shell 04/14/23 1611    Gerhard Munch, MD 04/24/23 1704

## 2023-04-14 NOTE — H&P (Addendum)
 History and Physical    Melissa Little WRU:[id number] DOB: 01-16-1986 DOA: 04/14/2023  PCP: [facility] Patient coming from: Home  I have personally briefly reviewed patient's old medical records in [facility]  Chief Complaint: Body aches, nausea, vomiting, shortness of breath  HPI: Melissa Bekele is a 38 y.o. female with medical history significant of adult stills disease, rheumatoid arthritis ( on chronic steroids and anakinra) presenting to the ED with a 3-day history of diffuse bodyaches/myalgias, nonproductive cough, nausea and emesis, sore throat, decreased oral intake, palpitations, and generalized weakness.  Patient does endorse some chills.  Patient denies any fevers, no chest pain, no abdominal pain, no diarrhea, no constipation, no melena, no hematemesis, no hematochezia, no syncopal episodes, no significant lightheadedness, no hemoptysis, no other associated symptoms.  ED Course: Patient seen in the ED noted to initially have elevated lactic acid of 4.3 which went down to 2.0 after some fluid resuscitation.  SARS coronavirus 2 PCR positive, influenza A and B PCR negative, RSV by PCR negative.  Comprehensive metabolic profile with a sodium of 127, potassium of 3.2, chloride of 95, bicarb of 21, calcium of 6.9, alk phosphatase 128, albumin of 1.6, AST of 109, total protein of 5.5 otherwise within normal limits.  CBC with a white count of 22 with a left shift with hemoglobin of 13, platelet count of 84.  Urinalysis pending.  Blood cultures ordered and pending.  Patient noted on presentation with tachycardia and heart rates in the 120s on presentation, tachypnea with respiratory rate of 40.  Review of Systems: As per HPI otherwise all other systems reviewed and are negative.  Past Medical History:  Diagnosis Date   Anemia    Rheumatoid arthritis (HCC)    Still's disease [facility])     Past Surgical History:  Procedure Laterality Date   IR US LIVER BIOPSY   10/01/2022   TUBAL LIGATION Bilateral 12/02/2022   Procedure: POST PARTUM TUBAL LIGATION;  Surgeon: Lazaro Arms, MD;  Location: [facility] LD ORS;  Service: Gynecology;  Laterality: Bilateral;    Social History  reports that she has never smoked. She has never used smokeless tobacco. She reports that she does not currently use alcohol. She reports that she does not currently use drugs after having used the following drugs: Marijuana.  No Known Allergies  Family History  Problem Relation Age of Onset   Diabetes Mother    Diabetes Father    COPD Father    Lung cancer Paternal Grandfather      Prior to Admission medications   Medication Sig Start Date End Date Taking? Authorizing Provider  anakinra Reita Cliche) 100 MG/0.67ML SOSY injection Inject 100 mg into the vein daily.   Yes [provider]  folic acid (FOLVITE) 400 MCG tablet Take 400 mcg by mouth daily.   Yes [provider]  ibuprofen (ADVIL) 600 MG tablet Take 1 tablet (600 mg total) by mouth every 6 (six) hours. 12/04/22  Yes Sundra Aland, MD  oxyCODONE (OXY IR/ROXICODONE) 5 MG immediate release tablet Take 1 tablet (5 mg total) by mouth every 6 (six) hours as needed for severe pain (pain score 7-10) or breakthrough pain. 12/04/22  Yes Sundra Aland, MD  predniSONE (DELTASONE) 10 MG tablet Take 1 tablet (10 mg total) by mouth daily with breakfast. 12/04/22  Yes Sundra Aland, MD  senna-docusate (SENOKOT-S) 8.6-50 MG tablet Take 2 tablets by mouth daily. Patient not taking: Reported on 04/14/2023 12/04/22   Sundra Aland, MD  Physical Exam: Vitals:   04/14/23 1345 04/14/23 1350 04/14/23 1445 04/14/23 1604  BP:    108/70  Pulse: (!) 113 (!) 113 (!) 108 99  Resp: (!) 23 (!) 26 (!) 23 (!) 23  Temp:    (!) 97.5 F (36.4 C)  TempSrc:    Oral  SpO2: 100% 100% 98% 97%    Constitutional: NAD, calm, comfortable Vitals:   04/14/23 1345 04/14/23 1350 04/14/23 1445 04/14/23 1604  BP:    108/70  Pulse: (!)  113 (!) 113 (!) 108 99  Resp: (!) 23 (!) 26 (!) 23 (!) 23  Temp:    (!) 97.5 F (36.4 C)  TempSrc:    Oral  SpO2: 100% 100% 98% 97%   Eyes: PERRL, lids and conjunctivae normal ENMT: Mucous membranes are extremely dry. Posterior pharynx clear of any exudate or lesions.Normal dentition.  Neck: normal, supple, no masses, no thyromegaly Respiratory: clear to auscultation bilaterally, no wheezing, no crackles. Normal respiratory effort. No accessory muscle use.  Cardiovascular: Tachycardia.  No murmurs rubs or gallops.  No lower extremity edema. 2+ pedal pulses. No carotid bruits.  Abdomen: no tenderness, no masses palpated. No hepatosplenomegaly. Bowel sounds positive.  Musculoskeletal: no clubbing / cyanosis. No joint deformity upper and lower extremities. Good ROM, no contractures. Normal muscle tone.  Skin: no rashes, lesions, ulcers. No induration Neurologic: CN 2-12 grossly intact. Sensation intact, DTR normal. Strength 5/5 in all 4.  Psychiatric: Normal judgment and insight. Alert and oriented x 3. Normal mood.   Labs on Admission: I have personally reviewed following labs and imaging studies  CBC: Recent Labs  Lab 04/14/23 1237  WBC 22.0*  NEUTROABS 19.8*  HGB 13.0  HCT 39.1  MCV 75.0*  PLT 84*    Basic Metabolic Panel: Recent Labs  Lab 04/14/23 1237  NA 127*  K 3.2*  CL 95*  CO2 21*  GLUCOSE 78  BUN 8  CREATININE 0.65  CALCIUM 6.9*    GFR: CrCl cannot be calculated (Unknown ideal weight.).  Liver Function Tests: Recent Labs  Lab 04/14/23 1237  AST 109*  ALT 25  ALKPHOS 128*  BILITOT 0.4  PROT 5.5*  ALBUMIN 1.6*    Urine analysis:    Component Value Date/Time   COLORURINE YELLOW 04/14/2023 1610   APPEARANCEUR HAZY (A) 04/14/2023 1610   LABSPEC 1.015 04/14/2023 1610   PHURINE 6.0 04/14/2023 1610   GLUCOSEU NEGATIVE 04/14/2023 1610   HGBUR SMALL (A) 04/14/2023 1610   BILIRUBINUR NEGATIVE 04/14/2023 1610   KETONESUR NEGATIVE 04/14/2023 1610    PROTEINUR 100 (A) 04/14/2023 1610   UROBILINOGEN >=8.0 08/12/2022 1034   NITRITE NEGATIVE 04/14/2023 1610   LEUKOCYTESUR MODERATE (A) 04/14/2023 1610    Radiological Exams on Admission: DG Chest 2 View Result Date: 04/14/2023 CLINICAL DATA:  cough, rhonchi, possible flu/PNA. EXAM: CHEST - 2 VIEW COMPARISON:  09/28/2022. FINDINGS: Bilateral lung fields are clear. Bilateral costophrenic angles are clear. Normal cardio-mediastinal silhouette. No acute osseous abnormalities. The soft tissues are within normal limits. IMPRESSION: No active cardiopulmonary disease. Electronically Signed   By: Jules Schick M.D.   On: 04/14/2023 13:38    EKG: Independently reviewed.  Sinus tachycardia.  Assessment/Plan Principal Problem:   COVID-19 Active Problems:   SIRS (systemic inflammatory response syndrome) (HCC)   Anxiety   Adult-onset Still's disease (HCC)   Dehydration   Hyponatremia   Hypokalemia   #1 COVID-19 infection -Patient presenting with 3-day history of myalgias, chills, shortness of breath, nonproductive cough, sore throat,  decreased oral intake. -COVID-19 PCR positive.  Influenza A and B PCR negative.  RSV by PCR negative. -Check a CRP. -Patient with history of stills disease on chronic steroids and anakinra and as such we will place on Paxlovid. -Double home regimen of prednisone to 20 mg daily. -Supportive care.  2.  SIRS -Patient on presentation met criteria for SIRS as patient noted to be tachycardic, tachypneic, have a significant leukocytosis and the elevated lactic acid level. -Likely secondary to problem #1. -Chest x-ray done with no acute infiltrate. -Blood cultures ordered and pending. -UA ordered and pending. -Lactic acid level trending down with hydration. -Due to history of stills disease, on chronic steroid and DMARD we will place empirically on IV vancomycin IV cefepime pending culture results.  3.  Dehydration -IV fluids.  4.  Hypokalemia -Secondary to GI  losses. -Check a magnesium level. -KCl 10 mEq IV every hour x 5 runs.  5.  Hyponatremia -Likely secondary to hypovolemic hyponatremia. -Check urine sodium, urine creatinine, urine and serum osmolality. -IV fluids. -Repeat labs in the AM.  6.  History of stills disease/rheumatoid arthritis -Due to concern for acute infection we will double home dose prednisone to 20 mg daily and hold patient's anakinra. -Outpatient follow-up with rheumatology.  7.  Thrombocytopenia -Likely secondary to acute infection. -Patient with no overt bleeding. -Follow.   DVT prophylaxis: Lovenox Code Status:   Full Family Communication:  Updated patient.  No of family at bedside. Disposition Plan:   Patient is from:  Home  Anticipated DC to:  Home  Anticipated DC date:  3 to 4 days.  Anticipated DC barriers: Clinical improvement  Consults called: None Admission status:  Place in progressive care unit.  Severity of Illness: The appropriate patient status for this patient is INPATIENT. Inpatient status is judged to be reasonable and necessary in order to provide the required intensity of service to ensure the patient's safety. The patient's presenting symptoms, physical exam findings, and initial radiographic and laboratory data in the context of their chronic comorbidities is felt to place them at high risk for further clinical deterioration. Furthermore, it is not anticipated that the patient will be medically stable for discharge from the hospital within 2 midnights of admission.   * I certify that at the point of admission it is my clinical judgment that the patient will require inpatient hospital care spanning beyond 2 midnights from the point of admission due to high intensity of service, high risk for further deterioration and high frequency of surveillance required.*    Ramiro Harvest MD [facility]  How to contact the [facility] Attending or Consulting provider 7A - 7P or covering provider  during after hours 7P -7A, for this patient?   Check the care team in [facility] and look for a) attending/consulting [facility] provider listed and b) the [facility] team listed Log into [email or web address] and use [place]'s universal password to access. If you do not have the password, please contact the hospital operator. Locate the [facility] provider you are looking for under [facility] and page to a number that you can be directly reached. If you still have difficulty reaching the provider, please page the [facility] (Director on Call) for the Hospitalists listed on amion for assistance.  04/14/2023, 4:56 PM

## 2023-04-15 DIAGNOSIS — E872 Acidosis, unspecified: Secondary | ICD-10-CM

## 2023-04-15 DIAGNOSIS — N390 Urinary tract infection, site not specified: Secondary | ICD-10-CM | POA: Insufficient documentation

## 2023-04-15 DIAGNOSIS — N3000 Acute cystitis without hematuria: Secondary | ICD-10-CM

## 2023-04-15 DIAGNOSIS — E860 Dehydration: Secondary | ICD-10-CM | POA: Diagnosis not present

## 2023-04-15 DIAGNOSIS — U071 COVID-19: Secondary | ICD-10-CM | POA: Diagnosis not present

## 2023-04-15 DIAGNOSIS — R6510 Systemic inflammatory response syndrome (SIRS) of non-infectious origin without acute organ dysfunction: Secondary | ICD-10-CM | POA: Diagnosis not present

## 2023-04-15 DIAGNOSIS — F419 Anxiety disorder, unspecified: Secondary | ICD-10-CM

## 2023-04-15 LAB — CBC WITH DIFFERENTIAL/PLATELET
Abs Immature Granulocytes: 0.94 10*3/uL — ABNORMAL HIGH (ref 0.00–0.07)
Basophils Absolute: 0.3 10*3/uL — ABNORMAL HIGH (ref 0.0–0.1)
Basophils Relative: 1 %
Eosinophils Absolute: 4.3 10*3/uL — ABNORMAL HIGH (ref 0.0–0.5)
Eosinophils Relative: 19 %
HCT: 32.2 % — ABNORMAL LOW (ref 36.0–46.0)
Hemoglobin: 10.9 g/dL — ABNORMAL LOW (ref 12.0–15.0)
Immature Granulocytes: 4 %
Lymphocytes Relative: 7 %
Lymphs Abs: 1.6 10*3/uL (ref 0.7–4.0)
MCH: 25.5 pg — ABNORMAL LOW (ref 26.0–34.0)
MCHC: 33.9 g/dL (ref 30.0–36.0)
MCV: 75.2 fL — ABNORMAL LOW (ref 80.0–100.0)
Monocytes Absolute: 1.9 10*3/uL — ABNORMAL HIGH (ref 0.1–1.0)
Monocytes Relative: 8 %
Neutro Abs: 14.2 10*3/uL — ABNORMAL HIGH (ref 1.7–7.7)
Neutrophils Relative %: 61 %
Platelets: 76 10*3/uL — ABNORMAL LOW (ref 150–400)
RBC: 4.28 MIL/uL (ref 3.87–5.11)
RDW: 19.4 % — ABNORMAL HIGH (ref 11.5–15.5)
Smear Review: NORMAL
WBC: 23.2 10*3/uL — ABNORMAL HIGH (ref 4.0–10.5)
nRBC: 0.1 % (ref 0.0–0.2)

## 2023-04-15 LAB — COMPREHENSIVE METABOLIC PANEL WITH GFR
ALT: 25 U/L (ref 0–44)
AST: 93 U/L — ABNORMAL HIGH (ref 15–41)
Albumin: <1.5 g/dL — ABNORMAL LOW (ref 3.5–5.0)
Alkaline Phosphatase: 105 U/L (ref 38–126)
Anion gap: 4 — ABNORMAL LOW (ref 5–15)
BUN: 8 mg/dL (ref 6–20)
CO2: 20 mmol/L — ABNORMAL LOW (ref 22–32)
Calcium: 6.7 mg/dL — ABNORMAL LOW (ref 8.9–10.3)
Chloride: 108 mmol/L (ref 98–111)
Creatinine, Ser: 0.69 mg/dL (ref 0.44–1.00)
GFR, Estimated: >60 mL/min (ref >60)
Glucose, Bld: 73 mg/dL (ref 70–99)
Potassium: 3.6 mmol/L (ref 3.5–5.1)
Sodium: 132 mmol/L — ABNORMAL LOW (ref 135–145)
Total Bilirubin: 0.6 mg/dL (ref 0.0–1.2)
Total Protein: 4.7 g/dL — ABNORMAL LOW (ref 6.5–8.1)

## 2023-04-15 LAB — IRON AND TIBC: Iron: 38 ug/dL (ref 28–170)

## 2023-04-15 LAB — PATHOLOGIST SMEAR REVIEW

## 2023-04-15 LAB — FOLATE: Folate: 3.4 ng/mL — ABNORMAL LOW (ref >5.9)

## 2023-04-15 LAB — LACTIC ACID, PLASMA: Lactic Acid, Venous: 1.3 mmol/L (ref 0.5–1.9)

## 2023-04-15 LAB — VITAMIN B12: Vitamin B-12: 196 pg/mL (ref 180–914)

## 2023-04-15 LAB — FERRITIN: Ferritin: >7500 ng/mL — ABNORMAL HIGH (ref 11–307)

## 2023-04-15 LAB — PHOSPHORUS: Phosphorus: 2.4 mg/dL — ABNORMAL LOW (ref 2.5–4.6)

## 2023-04-15 LAB — MAGNESIUM: Magnesium: 1.5 mg/dL — ABNORMAL LOW (ref 1.7–2.4)

## 2023-04-15 LAB — C-REACTIVE PROTEIN: CRP: 10 mg/dL — ABNORMAL HIGH (ref <1.0)

## 2023-04-15 MED ORDER — VANCOMYCIN HCL 750 MG/150ML IV SOLN
750.0000 mg | Freq: Two times a day (BID) | INTRAVENOUS | Status: DC
  Administered 2023-04-15 – 2023-04-17 (×5): 750 mg via INTRAVENOUS
  Filled 2023-04-15 (×5): qty 150

## 2023-04-15 MED ORDER — MELATONIN 3 MG PO TABS
3.0000 mg | ORAL_TABLET | Freq: Every day | ORAL | Status: DC
  Administered 2023-04-15 – 2023-04-20 (×6): 3 mg via ORAL
  Filled 2023-04-15 (×6): qty 1

## 2023-04-15 MED ORDER — GUAIFENESIN-DM 100-10 MG/5ML PO SYRP
10.0000 mL | ORAL_SOLUTION | ORAL | Status: DC | PRN
  Administered 2023-04-15: 10 mL via ORAL
  Filled 2023-04-15: qty 10

## 2023-04-15 MED ORDER — LEVALBUTEROL HCL 0.63 MG/3ML IN NEBU: 0.6300 mg | INHALATION_SOLUTION | Freq: Three times a day (TID) | RESPIRATORY_TRACT | Status: DC

## 2023-04-15 MED ORDER — ANAKINRA 100 MG/0.67ML ~~LOC~~ SOSY
100.0000 mg | PREFILLED_SYRINGE | SUBCUTANEOUS | Status: DC
Start: 2023-04-15 — End: 2023-04-15

## 2023-04-15 MED ORDER — FOLIC ACID 1 MG PO TABS
1.0000 mg | ORAL_TABLET | Freq: Every day | ORAL | Status: DC
  Administered 2023-04-16 – 2023-04-20 (×5): 1 mg via ORAL
  Filled 2023-04-15 (×5): qty 1

## 2023-04-15 MED ORDER — DIPHENHYDRAMINE HCL 25 MG PO CAPS
25.0000 mg | ORAL_CAPSULE | Freq: Four times a day (QID) | ORAL | Status: DC | PRN
  Administered 2023-04-15 – 2023-04-17 (×2): 25 mg via ORAL
  Filled 2023-04-15 (×2): qty 1

## 2023-04-15 MED ORDER — ANAKINRA 100 MG/0.67ML ~~LOC~~ SOSY
100.0000 mg | PREFILLED_SYRINGE | SUBCUTANEOUS | Status: DC
  Administered 2023-04-15 – 2023-04-20 (×6): 100 mg via SUBCUTANEOUS
  Filled 2023-04-15 (×9): qty 0.67

## 2023-04-15 MED ORDER — MAGNESIUM SULFATE 4 GM/100ML IV SOLN
4.0000 g | Freq: Once | INTRAVENOUS | Status: AC
  Administered 2023-04-15: 4 g via INTRAVENOUS
  Filled 2023-04-15: qty 100

## 2023-04-15 MED ORDER — LEVALBUTEROL HCL 0.63 MG/3ML IN NEBU
0.6300 mg | INHALATION_SOLUTION | Freq: Four times a day (QID) | RESPIRATORY_TRACT | Status: DC
Start: 2023-04-15 — End: 2023-04-15
  Administered 2023-04-15: 0.63 mg via RESPIRATORY_TRACT
  Filled 2023-04-15: qty 3

## 2023-04-15 MED ORDER — K PHOS MONO-SOD PHOS DI & MONO 155-852-130 MG PO TABS
250.0000 mg | ORAL_TABLET | Freq: Two times a day (BID) | ORAL | Status: AC
  Administered 2023-04-15 – 2023-04-17 (×6): 250 mg via ORAL
  Filled 2023-04-15 (×6): qty 1

## 2023-04-15 NOTE — Progress Notes (Signed)
 PROGRESS NOTE    Melissa Little  ZOX:[id number] DOB: 01-09-1986 DOA: 04/14/2023 PCP: [facility]    Chief Complaint  Patient presents with   Nausea   Shortness of Breath    Brief Narrative:  Patient 38 year old female history of adult stills disease, rheumatoid arthritis on chronic steroids and anakinra presented to ED with 3-day history of flulike symptoms.  Workup in the ED consistent with COVID-19 infection.  Patient also noted to have a SIRS picture and pancultured and placed empirically on IV antibiotics pending culture results.  Patient also started on Paxlovid.   Assessment & Plan:   Principal Problem:   COVID-19 Active Problems:   SIRS (systemic inflammatory response syndrome) (HCC)   Anxiety   Adult-onset Still's disease (HCC)   Dehydration   Hyponatremia   Hypokalemia   UTI (urinary tract infection)   Hypomagnesemia   Hypophosphatemia   Lactic acidosis  #1 COVID-19 infection -Patient presented with 3-day history of myalgias, chills, shortness of breath, nonproductive cough, sore throat, decreased oral intake. -COVID-19 PCR positive.  Influenza A and B PCR negative.  RSV by PCR negative. -CRP elevated currently at 10.0 from 9.2.] -Patient afebrile, patient with sats of 95% on room air. -Patient with history of stills disease on chronic steroids and anakinra and as such patient started on Paxlovid which we will continue.   -Continue double home regimen of prednisone 20 mg daily.   -Supportive care.    2.  SIRS -Patient on presentation met criteria for SIRS as patient noted to be tachycardic, tachypneic, have a significant leukocytosis and the elevated lactic acid level. -Likely secondary to problem #1. -Chest x-ray done with no acute infiltrate. -Blood cultures ordered and pending. -UA with concerns for possible UTI.   -Urine cultures pending.  -Lactic acid level trending down with hydration and lactic acidosis resolved.. -Due to  history of stills disease, on chronic steroid and DMARD patient placed empirically on IV vancomycin, IV cefepime.   -If patient improves clinically, remains afebrile, blood cultures continue to remain negative will discontinue IV vancomycin in the next 24 hours and continue empiric IV cefepime for 5 days.  3.  Lactic acidosis -Likely secondary to dehydration. -Improved with IV fluids.   4.  Dehydration -IV fluids.   5.  Hypokalemia/hypophosphatemia/hypomagnesemia -Secondary to GI losses. -Magnesium noted at 1.5 this morning, phosphorus at 2.4, potassium at 3.6.   -Magnesium sulfate 4 g IV x 1.   -K-Phos 250 mg p.o. twice daily x 3 days.   -Repeat labs in the AM.   6.  Hyponatremia -Likely secondary to hypovolemic hyponatremia. -Urine sodium of 25, urine creatinine 111, urine osmolality 406.   -Serum osmolality 276.   -Improving with hydration. -Repeat labs in the AM.   7.  History of stills disease/rheumatoid arthritis -Due to concern for acute infection home regimen prednisone increased to 20 mg daily.   -Resume home regimen anakinra. -Outpatient follow-up with rheumatology.   8.  Thrombocytopenia -Likely secondary to acute infection. -Patient with no overt bleeding. -Follow.   9. microcytic anemia -Likely iron deficiency anemia.  Also likely partly dilutional. -Patient with no overt bleeding. -Check an anemia panel. -Follow H&H. -Transfusion threshold hemoglobin < 7.  10.  UTI -Urine cultures pending. -On empiric IV cefepime.   DVT prophylaxis: SCDs Code Status: Full Family Communication: Updated patient.  No family at bedside. Disposition: Home when clinically improved  Status is: Inpatient Remains inpatient appropriate because: Severity of illness   Consultants:  None  Procedures:  Chest x-ray 04/14/2023  Antimicrobials:  Anti-infectives (From admission, onward)    Start     Dose/Rate Route Frequency Ordered Stop   04/15/23 0600  vancomycin  (VANCOCIN) IVPB 750 mg/150 ml premix  Status:  Discontinued       Placed in "Followed by" Linked Group   750 mg 150 mL/hr over 60 Minutes Intravenous Every 12 hours 04/14/23 1704 04/15/23 0140   04/15/23 0600  vancomycin (VANCOREADY) IVPB 750 mg/150 mL        750 mg 150 mL/hr over 60 Minutes Intravenous Every 12 hours 04/15/23 0141     04/14/23 2200  nirmatrelvir/ritonavir (PAXLOVID) 3 tablet        3 tablet Oral 2 times daily 04/14/23 1627 04/19/23 2159   04/14/23 1800  ceFEPIme (MAXIPIME) 2 g in sodium chloride 0.9 % 100 mL IVPB        2 g 200 mL/hr over 30 Minutes Intravenous Every 8 hours 04/14/23 1704     04/14/23 1800  Vancomycin (VANCOCIN) 1,250 mg in sodium chloride 0.9 % 250 mL IVPB       Placed in "Followed by" Linked Group   1,250 mg 166.7 mL/hr over 90 Minutes Intravenous  Once 04/14/23 1704 04/14/23 1935         Subjective: Patient laying in bed.  Stated had some chest tightness which has improved.  Denies any further nausea or vomiting.  Did not like the dysphagia 3 diet and as such did not eat it.  Myalgias slowly improving.  Feels cough and shortness of breath is slowly improving.  Concerned if she does not get her kineret during the hospitalization she will get sick.  Objective: Vitals:   04/14/23 2000 04/14/23 2050 04/14/23 2309 04/15/23 0337  BP: 105/68 113/73 112/84 122/69  Pulse: 90 95 (!) 101 97  Resp: (!) 21 (!) 22 18 20   Temp:  97.9 F (36.6 C) 98.1 F (36.7 C) 98.2 F (36.8 C)  TempSrc:  Oral Oral Oral  SpO2: 100% 98% 98% 95%  Weight:  55.3 kg    Height:  5\' 2"  (1.575 m)      Intake/Output Summary (Last 24 hours) at 04/15/2023 1025 Last data filed at 04/15/2023 0433 Gross per 24 hour  Intake 2572.29 ml  Output --  Net 2572.29 ml   Filed Weights   04/14/23 1619 04/14/23 2050  Weight: 54.5 kg 55.3 kg    Examination:  General exam: Appears calm and comfortable  Respiratory system: Clear to auscultation.  No wheezes, no crackles, no rhonchi.   Fair air movement.  Respiratory effort normal. Cardiovascular system: Tachycardia.  No murmurs rubs or gallops.  No JVD.  No lower extremity edema.  Gastrointestinal system: Abdomen is nondistended, soft and nontender. No organomegaly or masses felt. Normal bowel sounds heard. Central nervous system: Alert and oriented. No focal neurological deficits. Extremities: Symmetric 5 x 5 power. Skin: No rashes, lesions or ulcers Psychiatry: Judgement and insight appear normal. Mood & affect appropriate.     Data Reviewed: I have personally reviewed following labs and imaging studies  CBC: Recent Labs  Lab 04/14/23 1237 04/15/23 0329  WBC 22.0* 23.2*  NEUTROABS 19.8* 14.2*  HGB 13.0 10.9*  HCT 39.1 32.2*  MCV 75.0* 75.2*  PLT 84* 76*    Basic Metabolic Panel: Recent Labs  Lab 04/14/23 1237 04/14/23 1722 04/15/23 0329  NA 127*  --  132*  K 3.2*  --  3.6  CL 95*  --  108  CO2 21*  --  20*  GLUCOSE 78  --  73  BUN 8  --  8  CREATININE 0.65  --  0.69  CALCIUM 6.9*  --  6.7*  MG  --  1.4* 1.5*  PHOS  --   --  2.4*    GFR: Estimated Creatinine Clearance: 75.4 mL/min (by C-G formula based on SCr of 0.69 mg/dL).  Liver Function Tests: Recent Labs  Lab 04/14/23 1237 04/15/23 0329  AST 109* 93*  ALT 25 25  ALKPHOS 128* 105  BILITOT 0.4 0.6  PROT 5.5* 4.7*  ALBUMIN 1.6* <1.5*    CBG: No results for input(s): "GLUCAP" in the last 168 hours.   Recent Results (from the past 240 hours)  Resp panel by RT-PCR (RSV, Flu A&B, Covid) Anterior Nasal Swab     Status: Abnormal   Collection Time: 04/14/23 12:36 PM   Specimen: Anterior Nasal Swab  Result Value Ref Range Status   SARS Coronavirus 2 by RT PCR POSITIVE (A) NEGATIVE Final   Influenza A by PCR NEGATIVE NEGATIVE Final   Influenza B by PCR NEGATIVE NEGATIVE Final    Comment: (NOTE) The Xpert Xpress SARS-CoV-2/FLU/RSV plus assay is intended as an aid in the diagnosis of influenza from Nasopharyngeal swab specimens  and should not be used as a sole basis for treatment. Nasal washings and aspirates are unacceptable for Xpert Xpress SARS-CoV-2/FLU/RSV testing.  Fact Sheet for Patients: BloggerCourse.com  Fact Sheet for Healthcare Providers: SeriousBroker.it  This test is not yet approved or cleared by the [place] FDA and has been authorized for detection and/or diagnosis of SARS-CoV-2 by FDA under an Emergency Use Authorization (EUA). This EUA will remain in effect (meaning this test can be used) for the duration of the COVID-19 declaration under Section 564(b)(1) of the Act, 21 U.S.C. section 360bbb-3(b)(1), unless the authorization is terminated or revoked.     Resp Syncytial Virus by PCR NEGATIVE NEGATIVE Final    Comment: (NOTE) Fact Sheet for Patients: BloggerCourse.com  Fact Sheet for Healthcare Providers: SeriousBroker.it  This test is not yet approved or cleared by the [place] FDA and has been authorized for detection and/or diagnosis of SARS-CoV-2 by FDA under an Emergency Use Authorization (EUA). This EUA will remain in effect (meaning this test can be used) for the duration of the COVID-19 declaration under Section 564(b)(1) of the Act, 21 U.S.C. section 360bbb-3(b)(1), unless the authorization is terminated or revoked.  Performed at [facility], [address], [place], [place] [address]   Culture, blood (routine x 2)     Status: None (Preliminary result)   Collection Time: 04/14/23 12:37 PM   Specimen: BLOOD RIGHT FOREARM  Result Value Ref Range Status   Specimen Description BLOOD RIGHT FOREARM  Final   Special Requests   Final    BOTTLES DRAWN AEROBIC AND ANAEROBIC Blood Culture adequate volume   Culture   Final    NO GROWTH < 24 HOURS Performed at [facility], [address], [place], [place] [address]    Report Status PENDING  Incomplete   Culture, blood (routine x 2)     Status: None (Preliminary result)   Collection Time: 04/14/23 12:42 PM   Specimen: BLOOD  Result Value Ref Range Status   Specimen Description BLOOD RIGHT ANTECUBITAL  Final   Special Requests   Final    BOTTLES DRAWN AEROBIC AND ANAEROBIC Blood Culture adequate volume   Culture   Final  NO GROWTH < 24 HOURS Performed at [facility], [address] [place], [place] [address]    Report Status PENDING  Incomplete         Radiology Studies: DG Chest 2 View Result Date: 04/14/2023 CLINICAL DATA:  cough, rhonchi, possible flu/PNA. EXAM: CHEST - 2 VIEW COMPARISON:  09/28/2022. FINDINGS: Bilateral lung fields are clear. Bilateral costophrenic angles are clear. Normal cardio-mediastinal silhouette. No acute osseous abnormalities. The soft tissues are within normal limits. IMPRESSION: No active cardiopulmonary disease. Electronically Signed   By: Jules Schick M.D.   On: 04/14/2023 13:38        Scheduled Meds:  anakinra  100 mg Intravenous Q24H   folic acid  500 mcg Oral Daily   nirmatrelvir/ritonavir  3 tablet Oral BID   pantoprazole (PROTONIX) IV  40 mg Intravenous Q24H   phosphorus  250 mg Oral BID   predniSONE  20 mg Oral Q breakfast   senna-docusate  1 tablet Oral BID   sodium chloride flush  3 mL Intravenous Q12H   Continuous Infusions:  sodium chloride 125 mL/hr at 04/15/23 0433   ceFEPime (MAXIPIME) IV 2 g (04/15/23 0945)   magnesium sulfate bolus IVPB     vancomycin 750 mg (04/15/23 0634)     LOS: 1 day    Time spent: 45 mins    Ramiro Harvest, MD [facility]   To contact the attending provider between 7A-7P or the covering provider during after hours 7P-7A, please log into the web site [email or web address] and access using universal [place] password for that web site. If you do not have the password, please call the hospital operator.  04/15/2023, 10:25 AM

## 2023-04-15 NOTE — Progress Notes (Signed)
 Physical Therapy Evaluation Patient Details Name: Melissa Little MRN: [id number] DOB: [birth year] Today's Date: 04/15/2023  History of Present Illness  38 y.o. female presents to [facility] with 3 days of body aches, cough, N/V, sore throat, and weakness. Pt with Covid + diagnosis and SIRS. PMHx: still disease, RA, anxiety  Clinical Impression  Pt in bed upon arrival and agreeable to PT eval. PTA, pt was independent for mobility with no AD. Pt was very active and taking care of children at home. In today's session, pt was able to perform bed mobility with ModI and stand with supervision and no AD. Pt was able to ambulate with no AD, however, preferred use of IV pole for stability. Pt was able to ambulate ~40 ft with supervision for safety. Pt currently with functional limitations due to the deficits listed below (see PT Problem List). Pt would benefit from acute skilled PT to address functional impairments. Recommending post-acute HHPT to work on activity tolerance, strength, and balance. Acute PT to follow.         If plan is discharge home, recommend the following: A little help with walking and/or transfers;Assist for transportation;Help with stairs or ramp for entrance   Can travel by private vehicle    Yes    Equipment Recommendations Other (comment) (TBD)     Functional Status Assessment Patient has had a recent decline in their functional status and demonstrates the ability to make significant improvements in function in a reasonable and predictable amount of time.     Precautions / Restrictions Precautions Precautions: Fall Precaution/Restrictions Comments: airborne precautions Restrictions Weight Bearing Restrictions Per Provider Order: No      Mobility  Bed Mobility Overal bed mobility: Modified Independent      Transfers Overall transfer level: Needs assistance Equipment used: None Transfers: Sit to/from Stand Sit to Stand: Supervision    General transfer comment:  supervision to stand, reaches for UE support when available    Ambulation/Gait Ambulation/Gait assistance: Supervision Gait Distance (Feet): 40 Feet Assistive device: IV Pole, None Gait Pattern/deviations: Step-through pattern, Decreased step length - right, Decreased step length - left Gait velocity: decr     General Gait Details: slow and steady gait, prefers IV pole for support. However, pt is able to ambulate with no AD with no LOB     Balance Overall balance assessment: Needs assistance, Mild deficits observed, not formally tested Sitting-balance support: No upper extremity supported, Feet supported Sitting balance-Leahy Scale: Good     Standing balance support: No upper extremity supported Standing balance-Leahy Scale: Fair        Pertinent Vitals/Pain Pain Assessment Pain Assessment: Faces Faces Pain Scale: Hurts a little bit Pain Location: B arms Pain Descriptors / Indicators: Other (Comment) (itching) Pain Intervention(s): Limited activity within patient's tolerance, Monitored during session, Repositioned    Home Living Family/patient expects to be discharged to:: Private residence Living Arrangements: Spouse/significant other;Other relatives Available Help at Discharge: Family;Available 24 hours/day (aunt and 4 kids 11 down to 4 months) Type of Home: Apartment Home Access: Stairs to enter Entrance Stairs-Rails: Left Entrance Stairs-Number of Steps: 3   Home Layout: One level Home Equipment: None      Prior Function Prior Level of Function : Independent/Modified Independent;Driving      Extremity/Trunk Assessment   Upper Extremity Assessment Upper Extremity Assessment: Defer to OT evaluation    Lower Extremity Assessment Lower Extremity Assessment: Overall WFL for tasks assessed    Cervical / Trunk Assessment Cervical / Trunk Assessment: Normal  Communication  Communication Communication: No apparent difficulties    Cognition       PT -  Cognitive impairments: No apparent impairments     Cueing Cueing Techniques: Verbal cues     General Comments General comments (skin integrity, edema, etc.): VSS on RA    Exercises Other Exercises Other Exercises: Incentive spirometer, x3, able to pull 1500   Assessment/Plan    PT Assessment Patient needs continued PT services  PT Problem List Decreased activity tolerance;Decreased balance;Decreased mobility       PT Treatment Interventions DME instruction;Gait training;Stair training;Functional mobility training;Therapeutic activities;Therapeutic exercise;Balance training;Neuromuscular re-education;Patient/family education    PT Goals (Current goals can be found in the Care Plan section)  Acute Rehab PT Goals Patient Stated Goal: to get stronger PT Goal Formulation: With patient Time For Goal Achievement: 04/29/23 Potential to Achieve Goals: Good    Frequency Min 2X/week     Co-evaluation PT/OT/SLP Co-Evaluation/Treatment: Yes Reason for Co-Treatment: Complexity of the patient's impairments (multi-system involvement);To address functional/ADL transfers PT goals addressed during session: Mobility/safety with mobility;Balance         AM-PAC PT "6 Clicks" Mobility  Outcome Measure Help needed turning from your back to your side while in a flat bed without using bedrails?: None Help needed moving from lying on your back to sitting on the side of a flat bed without using bedrails?: None Help needed moving to and from a bed to a chair (including a wheelchair)?: A Little Help needed standing up from a chair using your arms (e.g., wheelchair or bedside chair)?: A Little Help needed to walk in hospital room?: A Little Help needed climbing 3-5 steps with a railing? : A Little 6 Click Score: 20    End of Session   Activity Tolerance: Patient tolerated treatment well Patient left: in chair;with call bell/phone within reach Nurse Communication: Mobility status PT Visit  Diagnosis: Unsteadiness on feet (R26.81);Other abnormalities of gait and mobility (R26.89)    Time: 1610-9604 PT Time Calculation (min) (ACUTE ONLY): 23 min   Charges:   PT Evaluation $PT Eval Low Complexity: 1 Low   PT General Charges $$ ACUTE PT VISIT: 1 Visit        Hilton Cork, PT, DPT Secure Chat Preferred  Rehab Office [phone]   Arturo Morton Brion Aliment 04/15/2023, 3:06 PM

## 2023-04-15 NOTE — Evaluation (Signed)
 Occupational Therapy Evaluation Patient Details Name: Melissa Little MRN: [id number] DOB: 07/16/1985 Today's Date: 04/15/2023   History of Present Illness   38 y.o. female presents to [facility] with 3 days of body aches, cough, N/V, sore throat, and weakness. Pt with Covid + diagnosis and SIRS. PMHx: still disease, RA, anxiety     Clinical Impressions Pt was independent prior to admission. She lives with her aunt, who works, and her 4 children who range in age from 57 to 4 months. Pt presents with generalized weakness and mild unsteadiness with ambulation requiring supervision for safety and IV line. Encouraged OOB activity and use of IS. Pt completes ADLs with up to supervision. She is likely to progress well and not require post acute OT.      If plan is discharge home, recommend the following:   A little help with bathing/dressing/bathroom;Assistance with cooking/housework;Assist for transportation;Help with stairs or ramp for entrance     Functional Status Assessment   Patient has had a recent decline in their functional status and demonstrates the ability to make significant improvements in function in a reasonable and predictable amount of time.     Equipment Recommendations   None recommended by OT     Recommendations for Other Services         Precautions/Restrictions   Precautions Precautions: Fall Recall of Precautions/Restrictions: Intact Precaution/Restrictions Comments: airborne precautions Restrictions Weight Bearing Restrictions Per Provider Order: No     Mobility Bed Mobility Overal bed mobility: Modified Independent                  Transfers Overall transfer level: Needs assistance Equipment used: None Transfers: Sit to/from Stand Sit to Stand: Supervision           General transfer comment: supervision to stand, reaches for UE support when available      Balance Overall balance assessment: Needs assistance, Mild deficits  observed, not formally tested   Sitting balance-Leahy Scale: Good     Standing balance support: No upper extremity supported Standing balance-Leahy Scale: Fair                             ADL either performed or assessed with clinical judgement   ADL Overall ADL's : Needs assistance/impaired Eating/Feeding: Independent   Grooming: Standing;Supervision/safety   Upper Body Bathing: Set up;Sitting   Lower Body Bathing: Sit to/from stand;Supervison/ safety   Upper Body Dressing : Set up;Sitting   Lower Body Dressing: Sit to/from stand;Supervision/safety   Toilet Transfer: Ambulation;Supervision/safety   Toileting- Clothing Manipulation and Hygiene: Supervision/safety;Sit to/from stand       Functional mobility during ADLs: Supervision/safety       Vision Baseline Vision/History: 0 No visual deficits Ability to See in Adequate Light: 0 Adequate Patient Visual Report: No change from baseline       Perception         Praxis         Pertinent Vitals/Pain Pain Assessment Pain Assessment: Faces Faces Pain Scale: Hurts a little bit Pain Location: B arms Pain Descriptors / Indicators:  (itching) Pain Intervention(s): Monitored during session     Extremity/Trunk Assessment Upper Extremity Assessment Upper Extremity Assessment: Overall WFL for tasks assessed   Lower Extremity Assessment Lower Extremity Assessment: Defer to PT evaluation   Cervical / Trunk Assessment Cervical / Trunk Assessment: Normal   Communication Communication Communication: No apparent difficulties   Cognition Arousal: Alert Behavior During Therapy: [facility] for tasks  assessed/performed Cognition: No apparent impairments                               Following commands: Intact       Cueing  General Comments   Cueing Techniques: Verbal cues  VSS on RA   Exercises     Shoulder Instructions      Home Living Family/patient expects to be discharged to::  Private residence Living Arrangements: Spouse/significant other;Other relatives Available Help at Discharge: Family;Available 24 hours/day Type of Home: Apartment Home Access: Stairs to enter Entrance Stairs-Number of Steps: 3 Entrance Stairs-Rails: Left Home Layout: One level     Bathroom Shower/Tub: Chief Strategy Officer: Standard     Home Equipment: None          Prior Functioning/Environment Prior Level of Function : Independent/Modified Independent;Driving                    OT Problem List: Decreased activity tolerance;Decreased strength;Impaired balance (sitting and/or standing)   OT Treatment/Interventions: Self-care/ADL training;Energy conservation;Patient/family education;Balance training;Therapeutic activities      OT Goals(Current goals can be found in the care plan section)   Acute Rehab OT Goals OT Goal Formulation: With patient Time For Goal Achievement: 04/15/23 Potential to Achieve Goals: Good   OT Frequency:  Min 2X/week    Co-evaluation   Reason for Co-Treatment: Complexity of the patient's impairments (multi-system involvement);To address functional/ADL transfers (pt unlikely to tolerate 2 sessions) PT goals addressed during session: Mobility/safety with mobility;Balance        AM-PAC OT "6 Clicks" Daily Activity     Outcome Measure Help from another person eating meals?: None Help from another person taking care of personal grooming?: A Little Help from another person toileting, which includes using toliet, bedpan, or urinal?: A Little Help from another person bathing (including washing, rinsing, drying)?: A Little Help from another person to put on and taking off regular upper body clothing?: A Little Help from another person to put on and taking off regular lower body clothing?: A Little 6 Click Score: 19   End of Session Equipment Utilized During Treatment: Gait belt  Activity Tolerance: Patient tolerated treatment  well Patient left: in chair;with call bell/phone within reach  OT Visit Diagnosis: Unsteadiness on feet (R26.81);Other abnormalities of gait and mobility (R26.89);Muscle weakness (generalized) (M62.81)                Time: 5409-8119 OT Time Calculation (min): 23 min Charges:  OT General Charges $OT Visit: 1 Visit OT Evaluation $OT Eval Low Complexity: 1 Low  Berna Spare, OTR/L Acute Rehabilitation Services Office: [phone]   Evern Bio 04/15/2023, 3:58 PM

## 2023-04-16 ENCOUNTER — Inpatient Hospital Stay (HOSPITAL_COMMUNITY)

## 2023-04-16 DIAGNOSIS — E860 Dehydration: Secondary | ICD-10-CM | POA: Diagnosis not present

## 2023-04-16 DIAGNOSIS — G9340 Encephalopathy, unspecified: Secondary | ICD-10-CM

## 2023-04-16 DIAGNOSIS — D696 Thrombocytopenia, unspecified: Secondary | ICD-10-CM

## 2023-04-16 DIAGNOSIS — M061 Adult-onset Still's disease: Secondary | ICD-10-CM | POA: Diagnosis not present

## 2023-04-16 DIAGNOSIS — R6510 Systemic inflammatory response syndrome (SIRS) of non-infectious origin without acute organ dysfunction: Secondary | ICD-10-CM | POA: Diagnosis not present

## 2023-04-16 DIAGNOSIS — U071 COVID-19: Secondary | ICD-10-CM | POA: Diagnosis not present

## 2023-04-16 DIAGNOSIS — D72829 Elevated white blood cell count, unspecified: Secondary | ICD-10-CM

## 2023-04-16 DIAGNOSIS — D721 Eosinophilia, unspecified: Secondary | ICD-10-CM | POA: Diagnosis not present

## 2023-04-16 LAB — CBC WITH DIFFERENTIAL/PLATELET
Abs Immature Granulocytes: 1.32 10*3/uL — ABNORMAL HIGH (ref 0.00–0.07)
Basophils Absolute: 0.3 10*3/uL — ABNORMAL HIGH (ref 0.0–0.1)
Basophils Relative: 1 %
Eosinophils Absolute: 1.7 10*3/uL — ABNORMAL HIGH (ref 0.0–0.5)
Eosinophils Relative: 7 %
HCT: 33.4 % — ABNORMAL LOW (ref 36.0–46.0)
Hemoglobin: 11.3 g/dL — ABNORMAL LOW (ref 12.0–15.0)
Immature Granulocytes: 5 %
Lymphocytes Relative: 10 %
Lymphs Abs: 2.5 10*3/uL (ref 0.7–4.0)
MCH: 25.5 pg — ABNORMAL LOW (ref 26.0–34.0)
MCHC: 33.8 g/dL (ref 30.0–36.0)
MCV: 75.2 fL — ABNORMAL LOW (ref 80.0–100.0)
Monocytes Absolute: 1.6 10*3/uL — ABNORMAL HIGH (ref 0.1–1.0)
Monocytes Relative: 7 %
Neutro Abs: 17.1 10*3/uL — ABNORMAL HIGH (ref 1.7–7.7)
Neutrophils Relative %: 70 %
Platelets: 95 10*3/uL — ABNORMAL LOW (ref 150–400)
RBC: 4.44 MIL/uL (ref 3.87–5.11)
RDW: 19.9 % — ABNORMAL HIGH (ref 11.5–15.5)
WBC: 24.5 10*3/uL — ABNORMAL HIGH (ref 4.0–10.5)
nRBC: 0 % (ref 0.0–0.2)

## 2023-04-16 LAB — DIC (DISSEMINATED INTRAVASCULAR COAGULATION)PANEL
D-Dimer, Quant: 3.67 ug{FEU}/mL — ABNORMAL HIGH (ref 0.00–0.50)
Fibrinogen: 61 mg/dL — CL (ref 210–475)
INR: 1.8 — ABNORMAL HIGH (ref 0.8–1.2)
Platelets: 97 10*3/uL — ABNORMAL LOW (ref 150–400)
Prothrombin Time: 21.2 s — ABNORMAL HIGH (ref 11.4–15.2)
Smear Review: NONE SEEN
aPTT: 42 s — ABNORMAL HIGH (ref 24–36)

## 2023-04-16 LAB — COMPREHENSIVE METABOLIC PANEL WITH GFR
ALT: 27 U/L (ref 0–44)
AST: 80 U/L — ABNORMAL HIGH (ref 15–41)
Albumin: <1.5 g/dL — ABNORMAL LOW (ref 3.5–5.0)
Alkaline Phosphatase: 131 U/L — ABNORMAL HIGH (ref 38–126)
Anion gap: 9 (ref 5–15)
BUN: 11 mg/dL (ref 6–20)
CO2: 17 mmol/L — ABNORMAL LOW (ref 22–32)
Calcium: 7.1 mg/dL — ABNORMAL LOW (ref 8.9–10.3)
Chloride: 105 mmol/L (ref 98–111)
Creatinine, Ser: 0.78 mg/dL (ref 0.44–1.00)
GFR, Estimated: >60 mL/min (ref >60)
Glucose, Bld: 89 mg/dL (ref 70–99)
Potassium: 3.6 mmol/L (ref 3.5–5.1)
Sodium: 131 mmol/L — ABNORMAL LOW (ref 135–145)
Total Bilirubin: 0.6 mg/dL (ref 0.0–1.2)
Total Protein: 4.7 g/dL — ABNORMAL LOW (ref 6.5–8.1)

## 2023-04-16 LAB — C-REACTIVE PROTEIN: CRP: 11.8 mg/dL — ABNORMAL HIGH (ref <1.0)

## 2023-04-16 LAB — MAGNESIUM: Magnesium: 2.1 mg/dL (ref 1.7–2.4)

## 2023-04-16 MED ORDER — IOHEXOL 350 MG/ML SOLN
75.0000 mL | Freq: Once | INTRAVENOUS | Status: AC | PRN
  Administered 2023-04-16: 75 mL via INTRAVENOUS

## 2023-04-16 MED ORDER — LORAZEPAM 0.5 MG PO TABS
0.5000 mg | ORAL_TABLET | Freq: Once | ORAL | Status: AC
  Administered 2023-04-16: 0.5 mg via ORAL
  Filled 2023-04-16: qty 1

## 2023-04-16 MED ORDER — PREDNISONE 20 MG PO TABS
40.0000 mg | ORAL_TABLET | Freq: Every day | ORAL | Status: DC
  Administered 2023-04-17 – 2023-04-18 (×2): 40 mg via ORAL
  Filled 2023-04-16 (×2): qty 2

## 2023-04-16 MED ORDER — SODIUM BICARBONATE 650 MG PO TABS
650.0000 mg | ORAL_TABLET | Freq: Two times a day (BID) | ORAL | Status: AC
  Administered 2023-04-16 – 2023-04-18 (×5): 650 mg via ORAL
  Filled 2023-04-16 (×5): qty 1

## 2023-04-16 MED ORDER — PREDNISONE 20 MG PO TABS
20.0000 mg | ORAL_TABLET | Freq: Once | ORAL | Status: AC
  Administered 2023-04-16: 20 mg via ORAL
  Filled 2023-04-16: qty 1

## 2023-04-16 MED ORDER — CEFEPIME HCL 2 G IV SOLR
2.0000 g | Freq: Three times a day (TID) | INTRAVENOUS | Status: AC
  Administered 2023-04-16: 2 g via INTRAVENOUS
  Filled 2023-04-16: qty 12.5

## 2023-04-16 NOTE — Progress Notes (Addendum)
 Noted a big ball handful size of hair lying on patients bed and another ball of hair on the floor asked pt if this had been pulled from her head s he said yes. Pt is withdrawn has little interaction with staff and little eye contact may need psych eval MD informed. Also noted she has pulled out her IV

## 2023-04-16 NOTE — Progress Notes (Addendum)
 pt found pacing in her room bed soaked SOB  pt o2 is 98% asked her if she has anxiety issues or panic attacks said yes she is withdrawn little eye contact answers questions with minimal responses MD and charge notified awaiting MD to eval and give orders if needed

## 2023-04-16 NOTE — Plan of Care (Signed)
  Problem: Education: Goal: Knowledge of risk factors and measures for prevention of condition will improve Outcome: Progressing   Problem: Coping: Goal: Psychosocial and spiritual needs will be supported Outcome: Progressing   Problem: Respiratory: Goal: Will maintain a patent airway Outcome: Progressing Goal: Complications related to the disease process, condition or treatment will be avoided or minimized Outcome: Progressing   Problem: Education: Goal: Knowledge of General Education information will improve Description: Including pain rating scale, medication(s)/side effects and non-pharmacologic comfort measures Outcome: Progressing   Problem: Health Behavior/Discharge Planning: Goal: Ability to manage health-related needs will improve Outcome: Progressing   Problem: Clinical Measurements: Goal: Ability to maintain clinical measurements within normal limits will improve Outcome: Progressing Goal: Will remain free from infection Outcome: Progressing   

## 2023-04-16 NOTE — Progress Notes (Signed)
 PROGRESS NOTE    Melissa Little  ZOX:[id number] DOB: [birth year] DOA: 04/14/2023 PCP: [facility]    Chief Complaint  Patient presents with   Nausea   Shortness of Breath    Brief Narrative:  Patient 38 year old female history of adult stills disease, rheumatoid arthritis on chronic steroids and anakinra presented to ED with 3-day history of flulike symptoms.  Workup in the ED consistent with COVID-19 infection.  Patient also noted to have a SIRS picture and pancultured and placed empirically on IV antibiotics pending culture results.  Patient also started on Paxlovid.   Assessment & Plan:   Principal Problem:   COVID-19 Active Problems:   SIRS (systemic inflammatory response syndrome) (HCC)   Anxiety   Adult-onset Still's disease (HCC)   Dehydration   Hyponatremia   Hypokalemia   UTI (urinary tract infection)   Hypomagnesemia   Hypophosphatemia   Lactic acidosis   Leukocytosis  #1 COVID-19 infection -Patient presented with 3-day history of myalgias, chills, shortness of breath, nonproductive cough, sore throat, decreased oral intake. -COVID-19 PCR positive.   Influenza A and B PCR negative.    RSV by PCR negative. -CRP elevated and slowly trending up currently at 11.8 from 10 from 9.2.  -Patient afebrile, patient with sats of 98-100% on room air. -Patient with history of stills disease on chronic steroids and anakinra and as such patient started on Paxlovid which we will continue.   -Increase prednisone to 40 mg daily.   -Supportive care.    2.  SIRS/leukocytosis -Patient on presentation met criteria for SIRS as patient noted to be tachycardic, tachypneic, have a significant leukocytosis and the elevated lactic acid level. -Likely secondary to problem #1. -Chest x-ray done with no acute infiltrate. -Blood cultures ordered and pending. -UA with concerns for possible UTI.   -Urine cultures with 60,000 colonies of E. coli.   -Lactic acidosis  resolved with hydration.   -Due to history of stills disease, on chronic steroid and DMARD patient placed empirically on IV vancomycin, IV cefepime.   -Patient with a worsening leukocytosis and as such we will consult with ID for further evaluation and management.  3.  Lactic acidosis -Likely secondary to dehydration. -Resolved with hydration.    4.  Dehydration -IV fluids.   5.  Hypokalemia/hypophosphatemia/hypomagnesemia -Secondary to GI losses. -Magnesium noted at 2.1 this morning, phosphorus at 2.4, potassium at 3.6.   -Repeat labs in the AM.   6.  Hyponatremia -Likely secondary to hypovolemic hyponatremia. -Urine sodium of 25, urine creatinine 111, urine osmolality 406.   -Serum osmolality 276.   -Improved with hydration. -Saline lock IV fluids. -Repeat labs in the AM.   7.  History of stills disease/rheumatoid arthritis -Due to concern for acute infection home regimen prednisone increased to 20 mg daily on admission. -Increase prednisone to 40 mg daily..   -Continue home regimen anakinra. -Outpatient follow-up with rheumatology.   8.  Thrombocytopenia -Likely secondary to acute infection. -Patient with no overt bleeding. -Follow.   9. microcytic anemia -Likely iron deficiency anemia.  Also likely partly dilutional. -Patient with no overt bleeding. -Anemia panel with iron level of 38, folate of 3.4, ferritin > 7500. -Increase folate to 1 mg daily.  -Follow H&H. -Transfusion threshold hemoglobin < 7.  10.  E. coli UTI -Urine cultures with 60,000 colonies of E. coli.   -Patient presenting with SIRS picture, immunocompromised on chronic steroids.  -Continue IV cefepime D3/3.   DVT prophylaxis: SCDs Code Status: Full Family Communication:  Updated patient.  No family at bedside. Disposition: Home when clinically improved  Status is: Inpatient Remains inpatient appropriate because: Severity of illness   Consultants:  ID pending  Procedures:  Chest x-ray  04/14/2023  Antimicrobials:  Anti-infectives (From admission, onward)    Start     Dose/Rate Route Frequency Ordered Stop   04/16/23 1800  ceFEPIme (MAXIPIME) 2 g in sodium chloride 0.9 % 100 mL IVPB        2 g 200 mL/hr over 30 Minutes Intravenous Every 8 hours 04/16/23 1400 04/17/23 0159   04/15/23 0600  vancomycin (VANCOCIN) IVPB 750 mg/150 ml premix  Status:  Discontinued       Placed in "Followed by" Linked Group   750 mg 150 mL/hr over 60 Minutes Intravenous Every 12 hours 04/14/23 1704 04/15/23 0140   04/15/23 0600  vancomycin (VANCOREADY) IVPB 750 mg/150 mL        750 mg 150 mL/hr over 60 Minutes Intravenous Every 12 hours 04/15/23 0141     04/14/23 2200  nirmatrelvir/ritonavir (PAXLOVID) 3 tablet        3 tablet Oral 2 times daily 04/14/23 1627 04/19/23 2159   04/14/23 1800  ceFEPIme (MAXIPIME) 2 g in sodium chloride 0.9 % 100 mL IVPB  Status:  Discontinued        2 g 200 mL/hr over 30 Minutes Intravenous Every 8 hours 04/14/23 1704 04/16/23 1400   04/14/23 1800  Vancomycin (VANCOCIN) 1,250 mg in sodium chloride 0.9 % 250 mL IVPB       Placed in "Followed by" Linked Group   1,250 mg 166.7 mL/hr over 90 Minutes Intravenous  Once 04/14/23 1704 04/14/23 1935         Subjective: Patient lying in bed.  Denies any chest pain or shortness of breath.  No abdominal pain.  Tolerating current diet.  Overall feeling better.  Denies any further emesis.   Objective: Vitals:   04/16/23 0303 04/16/23 0925 04/16/23 1220 04/16/23 1505  BP: 111/76 125/87 117/81 (!) 153/109  Pulse: 88 88 88 80  Resp: 20 20  14   Temp: 97.9 F (36.6 C) 97.9 F (36.6 C) 97.9 F (36.6 C) 97.8 F (36.6 C)  TempSrc: Oral Oral Oral Oral  SpO2: 100% 100% 100% 98%  Weight: 58.6 kg     Height:        Intake/Output Summary (Last 24 hours) at 04/16/2023 1641 Last data filed at 04/16/2023 0500 Gross per 24 hour  Intake 3594.3 ml  Output --  Net 3594.3 ml   Filed Weights   04/14/23 1619 04/14/23 2050  04/16/23 0303  Weight: 54.5 kg 55.3 kg 58.6 kg    Examination:  General exam: NAD. Respiratory system: Clear to auscultation bilaterally.  No wheezes, no crackles, no rhonchi.  Fair air movement.  Speaking in full sentences.  Cardiovascular system: RRR no murmurs rubs or gallops.  No JVD.  No lower extremity edema.  Gastrointestinal system: Abdomen is nondistended, soft and nontender. No organomegaly or masses felt. Normal bowel sounds heard. Central nervous system: Alert and oriented. No focal neurological deficits. Extremities: Symmetric 5 x 5 power. Skin: No rashes, lesions or ulcers Psychiatry: Judgement and insight appear normal. Mood & affect appropriate.     Data Reviewed: I have personally reviewed following labs and imaging studies  CBC: Recent Labs  Lab 04/14/23 1237 04/15/23 0329 04/16/23 0406 04/16/23 1532  WBC 22.0* 23.2* 24.5*  --   NEUTROABS 19.8* 14.2* 17.1*  --   HGB 13.0  10.9* 11.3*  --   HCT 39.1 32.2* 33.4*  --   MCV 75.0* 75.2* 75.2*  --   PLT 84* 76* 95* 97*    Basic Metabolic Panel: Recent Labs  Lab 04/14/23 1237 04/14/23 1722 04/15/23 0329 04/16/23 0406  NA 127*  --  132* 131*  K 3.2*  --  3.6 3.6  CL 95*  --  108 105  CO2 21*  --  20* 17*  GLUCOSE 78  --  73 89  BUN 8  --  8 11  CREATININE 0.65  --  0.69 0.78  CALCIUM 6.9*  --  6.7* 7.1*  MG  --  1.4* 1.5* 2.1  PHOS  --   --  2.4*  --     GFR: Estimated Creatinine Clearance: 75.4 mL/min (by C-G formula based on SCr of 0.78 mg/dL).  Liver Function Tests: Recent Labs  Lab 04/14/23 1237 04/15/23 0329 04/16/23 0406  AST 109* 93* 80*  ALT 25 25 27   ALKPHOS 128* 105 131*  BILITOT 0.4 0.6 0.6  PROT 5.5* 4.7* 4.7*  ALBUMIN 1.6* <1.5* <1.5*    CBG: No results for input(s): "GLUCAP" in the last 168 hours.   Recent Results (from the past 240 hours)  Resp panel by RT-PCR (RSV, Flu A&B, Covid) Anterior Nasal Swab     Status: Abnormal   Collection Time: 04/14/23 12:36 PM    Specimen: Anterior Nasal Swab  Result Value Ref Range Status   SARS Coronavirus 2 by RT PCR POSITIVE (A) NEGATIVE Final   Influenza A by PCR NEGATIVE NEGATIVE Final   Influenza B by PCR NEGATIVE NEGATIVE Final    Comment: (NOTE) The Xpert Xpress SARS-CoV-2/FLU/RSV plus assay is intended as an aid in the diagnosis of influenza from Nasopharyngeal swab specimens and should not be used as a sole basis for treatment. Nasal washings and aspirates are unacceptable for Xpert Xpress SARS-CoV-2/FLU/RSV testing.  Fact Sheet for Patients: BloggerCourse.com  Fact Sheet for Healthcare Providers: SeriousBroker.it  This test is not yet approved or cleared by the [place] FDA and has been authorized for detection and/or diagnosis of SARS-CoV-2 by FDA under an Emergency Use Authorization (EUA). This EUA will remain in effect (meaning this test can be used) for the duration of the COVID-19 declaration under Section 564(b)(1) of the Act, 21 U.S.C. section 360bbb-3(b)(1), unless the authorization is terminated or revoked.     Resp Syncytial Virus by PCR NEGATIVE NEGATIVE Final    Comment: (NOTE) Fact Sheet for Patients: BloggerCourse.com  Fact Sheet for Healthcare Providers: SeriousBroker.it  This test is not yet approved or cleared by the [place] FDA and has been authorized for detection and/or diagnosis of SARS-CoV-2 by FDA under an Emergency Use Authorization (EUA). This EUA will remain in effect (meaning this test can be used) for the duration of the COVID-19 declaration under Section 564(b)(1) of the Act, 21 U.S.C. section 360bbb-3(b)(1), unless the authorization is terminated or revoked.  Performed at [facility], [address] [place], [place] [address]   Culture, blood (routine x 2)     Status: None (Preliminary result)   Collection Time: 04/14/23 12:37 PM    Specimen: BLOOD RIGHT FOREARM  Result Value Ref Range Status   Specimen Description BLOOD RIGHT FOREARM  Final   Special Requests   Final    BOTTLES DRAWN AEROBIC AND ANAEROBIC Blood Culture adequate volume   Culture   Final    NO GROWTH 2 DAYS Performed at [facility], [address] [place], [place] [address]    Report Status PENDING  Incomplete  Culture, blood (routine x 2)     Status: None (Preliminary result)   Collection Time: 04/14/23 12:42 PM   Specimen: BLOOD  Result Value Ref Range Status   Specimen Description BLOOD RIGHT ANTECUBITAL  Final   Special Requests   Final    BOTTLES DRAWN AEROBIC AND ANAEROBIC Blood Culture adequate volume   Culture   Final    NO GROWTH 2 DAYS Performed at [facility], [address], [place], [place] [address]    Report Status PENDING  Incomplete  Urine Culture     Status: Abnormal (Preliminary result)   Collection Time: 04/14/23  4:10 PM   Specimen: Urine, Clean Catch  Result Value Ref Range Status   Specimen Description URINE, CLEAN CATCH  Final   Special Requests Immunocompromised  Final   Culture (A)  Final    60,000 COLONIES/mL ESCHERICHIA COLI CULTURE REINCUBATED FOR BETTER GROWTH Performed at [facility], [address] [place], [place] [address]    Report Status PENDING  Incomplete   Organism ID, Bacteria ESCHERICHIA COLI (A)  Final      Susceptibility   Escherichia coli - MIC*    AMPICILLIN >=32 RESISTANT Resistant     CEFAZOLIN <=4 SENSITIVE Sensitive     CEFEPIME <=0.12 SENSITIVE Sensitive     CEFTRIAXONE <=0.25 SENSITIVE Sensitive     CIPROFLOXACIN <=0.25 SENSITIVE Sensitive     GENTAMICIN <=1 SENSITIVE Sensitive     IMIPENEM <=0.25 SENSITIVE Sensitive     NITROFURANTOIN <=16 SENSITIVE Sensitive     TRIMETH/SULFA <=20 SENSITIVE Sensitive     AMPICILLIN/SULBACTAM 8 SENSITIVE Sensitive     PIP/TAZO <=4 SENSITIVE Sensitive ug/mL    * 60,000 COLONIES/mL ESCHERICHIA COLI         Radiology  Studies: DG CHEST PORT 1 VIEW Result Date: 04/16/2023 CLINICAL DATA:  Leukocytosis. EXAM: PORTABLE CHEST 1 VIEW COMPARISON:  Chest x-ray dated April 14, 2023. FINDINGS: The heart size and mediastinal contours are within normal limits. Both lungs are clear. The visualized skeletal structures are unremarkable. IMPRESSION: No active disease. Electronically Signed   By: Obie Dredge M.D.   On: 04/16/2023 12:58        Scheduled Meds:  anakinra  100 mg Subcutaneous Q24H   folic acid  1 mg Oral Daily   melatonin  3 mg Oral QHS   nirmatrelvir/ritonavir  3 tablet Oral BID   pantoprazole (PROTONIX) IV  40 mg Intravenous Q24H   phosphorus  250 mg Oral BID   predniSONE  20 mg Oral Once   [START ON 04/17/2023] predniSONE  40 mg Oral Q breakfast   senna-docusate  1 tablet Oral BID   sodium bicarbonate  650 mg Oral BID   sodium chloride flush  3 mL Intravenous Q12H   Continuous Infusions:  ceFEPime (MAXIPIME) IV     vancomycin 750 mg (04/16/23 1035)     LOS: 2 days    Time spent: 45 mins    Ramiro Harvest, MD [facility]   To contact the attending provider between 7A-7P or the covering provider during after hours 7P-7A, please log into the web site [email or web address] and access using universal [place] password for that web site. If you do not have the password, please call the hospital operator.  04/16/2023, 4:41 PM

## 2023-04-16 NOTE — Consult Note (Signed)
 [facility]    Date of Admission:  04/14/2023     Reason for Consult: leukocytosis    Referring Provider: Jerelyn Charles       Abx: 3/27-c cefepime 3/27-c vanc  3/27-c paxlovid       Other: 3/27-c prednisone 20 mg daily  Assessment: 38 yo female with recently dx'ed adults stills disease, rheumatoid arthritis, on chronic steroids and anakinra, admitted for 3 daydiffuse myalgia, body ache, nonpreductive cough, n/v, sorethroat, malaise, found to have positive covid pcr, course with persistent leukocytosis  Other issues noted: Thrombocytopenia Very high eosinophilia on differential; no blasts; neutrophil predominance and also slight basophilia; low grade monocytosis improving    Afebrile on exam but wbc 23-25 Covid pcr positive (full resp pcr panel not done)  Cxr is clear and on room air  Non-anion gap metabolic acidosis persistent; normal renal function     3/27 bcx ngt 3/27 ucx ecoli only 60k; no uti sx  Ra/stills disease: Followed by [facility] -- last seen 11/2022 Steroid -- prednisone 80 mg 10/08/22 long tapered course tapered to 10 mg daily since 11/30/22 Anakinra since 10/06/2022 daily 100 mg daily maintenance     Given her recent high dose steroid use, pjp risk remains high potentiall up to 6 months after dose decreased below 15 mg daily from 11/2022 Anakinra by itself shouldn't add any infectious risk So OI risk would be pjp, potentially invasive fungal disease but less likely, and common bacterial pneumonia including nocardia/legionellosis   So far the only objective finding is leukocytosis and thrombocytopenia with mild lft elevation and covid positive on pcr. The later two could be sepsis Dic considered although blood smear read without schistocytes She does have rather marked eosinophilia on differential suggestive potential AI  I would finish covid treatment, try to give her stress dose steroid for 5-7  days  I have low suspicion that she has flare of stills disease/hlh but something to consider if above management doesn't work. Also would review drug related cause for leukocytosis/eosinophilia as well    Consider abd pelv chest ct as well in setting OI mentioned  Plan: Consider abd pelv chest ct with contrast Stress dose steroid Ldh, coagulation panel Covid management per primary team I would stop vancomycin and cefepime for now Airborn/droplet isolation Discussed with primary team      ------------------------------------------------ Principal Problem:   COVID-19 Active Problems:   Anxiety   Adult-onset Still's disease (HCC)   SIRS (systemic inflammatory response syndrome) (HCC)   Dehydration   Hyponatremia   Hypokalemia   UTI (urinary tract infection)   Hypomagnesemia   Hypophosphatemia   Lactic acidosis    HPI: Melissa Little is a 38 y.o. female ra, stills disease tapered on steroid to 10 mg since 11/2023, admitted with covid and now with persitent leukocytosis  Spoke with patient Flu like illness for several days but since admission dx'ed with covid and improving on paxlovid/supportive care  No further myalgia, malaise; appetite picking up No fever, chill, focal discomfort, rash  Still wbc is in 20s along with eosinophilia Present is thrombocytopenia as well  Followed by [facility]  Imaging/labs/vitals reviewed  Patient also denies n/v/diarrhea/headache, any focal pain      Family History  Problem Relation Age of Onset   Diabetes Mother    Diabetes Father    COPD Father    Lung cancer Paternal Grandfather     Social History   Tobacco Use  Smoking status: Never   Smokeless tobacco: Never  Vaping Use   Vaping status: Never Used  Substance Use Topics   Alcohol use: Not Currently    Comment: occasional   Drug use: Not Currently    Types: Marijuana    No Known Allergies  Review of Systems: ROS All Other ROS was negative,  except mentioned above   Past Medical History:  Diagnosis Date   Anemia    Rheumatoid arthritis (HCC)    Still's disease (HCC)        Scheduled Meds:  anakinra  100 mg Subcutaneous Q24H   folic acid  1 mg Oral Daily   melatonin  3 mg Oral QHS   nirmatrelvir/ritonavir  3 tablet Oral BID   pantoprazole (PROTONIX) IV  40 mg Intravenous Q24H   phosphorus  250 mg Oral BID   predniSONE  20 mg Oral Q breakfast   senna-docusate  1 tablet Oral BID   sodium bicarbonate  650 mg Oral BID   sodium chloride flush  3 mL Intravenous Q12H   Continuous Infusions:  ceFEPime (MAXIPIME) IV 2 g (04/16/23 1004)   vancomycin 750 mg (04/16/23 1035)   PRN Meds:.acetaminophen **OR** acetaminophen, diphenhydrAMINE, guaiFENesin-dextromethorphan, hydrALAZINE, ketorolac, levalbuterol, magnesium citrate, methocarbamol (ROBAXIN) injection, ondansetron **OR** ondansetron (ZOFRAN) IV, oxyCODONE, sorbitol   OBJECTIVE: Blood pressure 117/81, pulse 88, temperature 97.9 F (36.6 C), temperature source Oral, resp. rate 20, height 5\' 2"  (1.575 m), weight 58.6 kg, SpO2 100%, unknown if currently breastfeeding.  Physical Exam  General/constitutional: no distress, pleasant HEENT: Normocephalic, PER, Conj Clear, EOMI, Oropharynx clear Neck supple CV: rrr no mrg Lungs: clear to auscultation, normal respiratory effort Abd: Soft, Nontender Ext: no edema Skin: No Rash Neuro: nonfocal MSK: no peripheral joint swelling/tenderness/warmth; back spines nontender    Lab Results Lab Results  Component Value Date   WBC 24.5 (H) 04/16/2023   HGB 11.3 (L) 04/16/2023   HCT 33.4 (L) 04/16/2023   MCV 75.2 (L) 04/16/2023   PLT 95 (L) 04/16/2023    Lab Results  Component Value Date   CREATININE 0.78 04/16/2023   BUN 11 04/16/2023   NA 131 (L) 04/16/2023   K 3.6 04/16/2023   CL 105 04/16/2023   CO2 17 (L) 04/16/2023    Lab Results  Component Value Date   ALT 27 04/16/2023   AST 80 (H) 04/16/2023   ALKPHOS  131 (H) 04/16/2023   BILITOT 0.6 04/16/2023      Microbiology: Recent Results (from the past 240 hours)  Resp panel by RT-PCR (RSV, Flu A&B, Covid) Anterior Nasal Swab     Status: Abnormal   Collection Time: 04/14/23 12:36 PM   Specimen: Anterior Nasal Swab  Result Value Ref Range Status   SARS Coronavirus 2 by RT PCR POSITIVE (A) NEGATIVE Final   Influenza A by PCR NEGATIVE NEGATIVE Final   Influenza B by PCR NEGATIVE NEGATIVE Final    Comment: (NOTE) The Xpert Xpress SARS-CoV-2/FLU/RSV plus assay is intended as an aid in the diagnosis of influenza from Nasopharyngeal swab specimens and should not be used as a sole basis for treatment. Nasal washings and aspirates are unacceptable for Xpert Xpress SARS-CoV-2/FLU/RSV testing.  Fact Sheet for Patients: BloggerCourse.com  Fact Sheet for Healthcare Providers: SeriousBroker.it  This test is not yet approved or cleared by the [place] FDA and has been authorized for detection and/or diagnosis of SARS-CoV-2 by FDA under an Emergency Use Authorization (EUA). This EUA will remain in effect (meaning this test can  be used) for the duration of the COVID-19 declaration under Section 564(b)(1) of the Act, 21 U.S.C. section 360bbb-3(b)(1), unless the authorization is terminated or revoked.     Resp Syncytial Virus by PCR NEGATIVE NEGATIVE Final    Comment: (NOTE) Fact Sheet for Patients: BloggerCourse.com  Fact Sheet for Healthcare Providers: SeriousBroker.it  This test is not yet approved or cleared by the [place] FDA and has been authorized for detection and/or diagnosis of SARS-CoV-2 by FDA under an Emergency Use Authorization (EUA). This EUA will remain in effect (meaning this test can be used) for the duration of the COVID-19 declaration under Section 564(b)(1) of the Act, 21 U.S.C. section 360bbb-3(b)(1), unless the  authorization is terminated or revoked.  Performed at [facility], [address] [place], [place] [address]   Culture, blood (routine x 2)     Status: None (Preliminary result)   Collection Time: 04/14/23 12:37 PM   Specimen: BLOOD RIGHT FOREARM  Result Value Ref Range Status   Specimen Description BLOOD RIGHT FOREARM  Final   Special Requests   Final    BOTTLES DRAWN AEROBIC AND ANAEROBIC Blood Culture adequate volume   Culture   Final    NO GROWTH 2 DAYS Performed at [facility], [address] [place], [place] [address]    Report Status PENDING  Incomplete  Culture, blood (routine x 2)     Status: None (Preliminary result)   Collection Time: 04/14/23 12:42 PM   Specimen: BLOOD  Result Value Ref Range Status   Specimen Description BLOOD RIGHT ANTECUBITAL  Final   Special Requests   Final    BOTTLES DRAWN AEROBIC AND ANAEROBIC Blood Culture adequate volume   Culture   Final    NO GROWTH 2 DAYS Performed at [facility], [address] [place], [place] [address]    Report Status PENDING  Incomplete  Urine Culture     Status: Abnormal (Preliminary result)   Collection Time: 04/14/23  4:10 PM   Specimen: Urine, Clean Catch  Result Value Ref Range Status   Specimen Description URINE, CLEAN CATCH  Final   Special Requests Immunocompromised  Final   Culture (A)  Final    60,000 COLONIES/mL ESCHERICHIA COLI CULTURE REINCUBATED FOR BETTER GROWTH Performed at [facility], [address], [place], [place] [address]    Report Status PENDING  Incomplete   Organism ID, Bacteria ESCHERICHIA COLI (A)  Final      Susceptibility   Escherichia coli - MIC*    AMPICILLIN >=32 RESISTANT Resistant     CEFAZOLIN <=4 SENSITIVE Sensitive     CEFEPIME <=0.12 SENSITIVE Sensitive     CEFTRIAXONE <=0.25 SENSITIVE Sensitive     CIPROFLOXACIN <=0.25 SENSITIVE Sensitive     GENTAMICIN <=1 SENSITIVE Sensitive     IMIPENEM <=0.25 SENSITIVE Sensitive      NITROFURANTOIN <=16 SENSITIVE Sensitive     TRIMETH/SULFA <=20 SENSITIVE Sensitive     AMPICILLIN/SULBACTAM 8 SENSITIVE Sensitive     PIP/TAZO <=4 SENSITIVE Sensitive ug/mL    * 60,000 COLONIES/mL ESCHERICHIA COLI     Serology:    Imaging: If present, new imagings (plain films, ct scans, and mri) have been personally visualized and interpreted; radiology reports have been reviewed. Decision making incorporated into the Impression / Recommendations.  3/27 cxr IMPRESSION: No active cardiopulmonary disease.  3/29 cxr Pending   Raymondo Band, MD [facility] [phone] pager  04/16/2023, 12:55 PM

## 2023-04-16 NOTE — Progress Notes (Signed)
 Notified by RN that patient acting strangely this afternoon. Patient nonverbal.  Per RN patient noted to have a big bowel and full size appeared laying on the bed.  Patient noted to be somewhat withdrawn to staff and noted to have pulled out her IV and urinated on her bed. Physical exam General: Withdrawn.  Not speaking. Respiratory: Lungs clear to auscultation bilaterally.  No wheezes, no crackles, no rhonchi.  Fair air movement.  Speaking full sentences. Cardiovascular: Tachycardia.  No murmurs rubs or gallops.  No JVD.  No lower extremity edema. GI: Abdomen is soft, nontender, nondistended, positive bowel sounds. Extremities: No clubbing cyanosis or edema Neuro: Alert.  Moving extremities spontaneously.  No focal neurological deficits.  Assessment/plan Problem #1 acute encephalopathy -Patient noted to be somewhat withdrawn, this afternoon, not speaking however nodding her head to some questions and following commands. ??  Anxiety. Trial dose of Ativan 0.5 mg p.o. x 1. Follow.  Time spent:  35 minutes

## 2023-04-17 DIAGNOSIS — R6510 Systemic inflammatory response syndrome (SIRS) of non-infectious origin without acute organ dysfunction: Secondary | ICD-10-CM | POA: Diagnosis not present

## 2023-04-17 DIAGNOSIS — R7402 Elevation of levels of lactic acid dehydrogenase (LDH): Secondary | ICD-10-CM | POA: Diagnosis not present

## 2023-04-17 DIAGNOSIS — U071 COVID-19: Secondary | ICD-10-CM | POA: Diagnosis not present

## 2023-04-17 DIAGNOSIS — E860 Dehydration: Secondary | ICD-10-CM | POA: Diagnosis not present

## 2023-04-17 DIAGNOSIS — D721 Eosinophilia, unspecified: Secondary | ICD-10-CM | POA: Diagnosis not present

## 2023-04-17 DIAGNOSIS — R601 Generalized edema: Secondary | ICD-10-CM | POA: Insufficient documentation

## 2023-04-17 DIAGNOSIS — G9340 Encephalopathy, unspecified: Secondary | ICD-10-CM

## 2023-04-17 DIAGNOSIS — M061 Adult-onset Still's disease: Secondary | ICD-10-CM | POA: Diagnosis not present

## 2023-04-17 DIAGNOSIS — D688 Other specified coagulation defects: Secondary | ICD-10-CM

## 2023-04-17 DIAGNOSIS — D696 Thrombocytopenia, unspecified: Secondary | ICD-10-CM | POA: Diagnosis not present

## 2023-04-17 LAB — TYPE AND SCREEN
ABO/RH(D): B POS
Antibody Screen: NEGATIVE

## 2023-04-17 LAB — IRON AND TIBC: Iron: 40 ug/dL (ref 28–170)

## 2023-04-17 LAB — CBC WITH DIFFERENTIAL/PLATELET
Abs Immature Granulocytes: 1.07 10*3/uL — ABNORMAL HIGH (ref 0.00–0.07)
Basophils Absolute: 0.2 10*3/uL — ABNORMAL HIGH (ref 0.0–0.1)
Basophils Relative: 2 %
Eosinophils Absolute: 0.3 10*3/uL (ref 0.0–0.5)
Eosinophils Relative: 2 %
HCT: 35.7 % — ABNORMAL LOW (ref 36.0–46.0)
Hemoglobin: 12.2 g/dL (ref 12.0–15.0)
Immature Granulocytes: 8 %
Lymphocytes Relative: 16 %
Lymphs Abs: 2.1 10*3/uL (ref 0.7–4.0)
MCH: 25.1 pg — ABNORMAL LOW (ref 26.0–34.0)
MCHC: 34.2 g/dL (ref 30.0–36.0)
MCV: 73.5 fL — ABNORMAL LOW (ref 80.0–100.0)
Monocytes Absolute: 1.2 10*3/uL — ABNORMAL HIGH (ref 0.1–1.0)
Monocytes Relative: 9 %
Neutro Abs: 8.2 10*3/uL — ABNORMAL HIGH (ref 1.7–7.7)
Neutrophils Relative %: 63 %
Platelets: 91 10*3/uL — ABNORMAL LOW (ref 150–400)
RBC: 4.86 MIL/uL (ref 3.87–5.11)
RDW: 20.5 % — ABNORMAL HIGH (ref 11.5–15.5)
WBC: 13.1 10*3/uL — ABNORMAL HIGH (ref 4.0–10.5)
nRBC: 0 % (ref 0.0–0.2)

## 2023-04-17 LAB — BASIC METABOLIC PANEL WITH GFR
Anion gap: 8 (ref 5–15)
BUN: 15 mg/dL (ref 6–20)
CO2: 19 mmol/L — ABNORMAL LOW (ref 22–32)
Calcium: 7.3 mg/dL — ABNORMAL LOW (ref 8.9–10.3)
Chloride: 106 mmol/L (ref 98–111)
Creatinine, Ser: 0.78 mg/dL (ref 0.44–1.00)
GFR, Estimated: >60 mL/min (ref >60)
Glucose, Bld: 88 mg/dL (ref 70–99)
Potassium: 4.4 mmol/L (ref 3.5–5.1)
Sodium: 133 mmol/L — ABNORMAL LOW (ref 135–145)

## 2023-04-17 LAB — DIC (DISSEMINATED INTRAVASCULAR COAGULATION)PANEL
D-Dimer, Quant: 7.34 ug{FEU}/mL — ABNORMAL HIGH (ref 0.00–0.50)
Fibrinogen: <60 mg/dL — CL (ref 210–475)
INR: 1.8 — ABNORMAL HIGH (ref 0.8–1.2)
Platelets: 104 10*3/uL — ABNORMAL LOW (ref 150–400)
Prothrombin Time: 21.2 s — ABNORMAL HIGH (ref 11.4–15.2)
Smear Review: NONE SEEN
aPTT: 36 s (ref 24–36)

## 2023-04-17 LAB — FERRITIN: Ferritin: >7500 ng/mL — ABNORMAL HIGH (ref 11–307)

## 2023-04-17 LAB — TRIGLYCERIDES: Triglycerides: 692 mg/dL — ABNORMAL HIGH (ref <150)

## 2023-04-17 LAB — FIBRINOGEN: Fibrinogen: <60 mg/dL — CL (ref 210–475)

## 2023-04-17 LAB — C-REACTIVE PROTEIN: CRP: 6.6 mg/dL — ABNORMAL HIGH (ref <1.0)

## 2023-04-17 LAB — LACTATE DEHYDROGENASE: LDH: 1375 U/L — ABNORMAL HIGH (ref 98–192)

## 2023-04-17 MED ORDER — FUROSEMIDE 10 MG/ML IJ SOLN
40.0000 mg | Freq: Once | INTRAMUSCULAR | Status: AC
  Administered 2023-04-17: 40 mg via INTRAVENOUS
  Filled 2023-04-17: qty 4

## 2023-04-17 MED ORDER — LORAZEPAM 1 MG PO TABS
1.0000 mg | ORAL_TABLET | Freq: Three times a day (TID) | ORAL | Status: DC | PRN
  Administered 2023-04-17: 1 mg via ORAL
  Filled 2023-04-17: qty 1

## 2023-04-17 MED ORDER — SODIUM CHLORIDE 0.9% IV SOLUTION: Freq: Once | INTRAVENOUS | Status: DC

## 2023-04-17 MED ORDER — ALBUMIN HUMAN 25 % IV SOLN
25.0000 g | Freq: Four times a day (QID) | INTRAVENOUS | Status: AC
  Administered 2023-04-17: 25 g via INTRAVENOUS
  Administered 2023-04-17 – 2023-04-18 (×2): 12.5 g via INTRAVENOUS
  Administered 2023-04-18: 25 g via INTRAVENOUS
  Filled 2023-04-17 (×4): qty 100

## 2023-04-17 MED ORDER — PHYTONADIONE 5 MG PO TABS
5.0000 mg | ORAL_TABLET | Freq: Every day | ORAL | Status: DC
  Administered 2023-04-17 – 2023-04-20 (×4): 5 mg via ORAL
  Filled 2023-04-17 (×4): qty 1

## 2023-04-17 MED ORDER — ALBUMIN HUMAN 25 % IV SOLN
25.0000 g | Freq: Once | INTRAVENOUS | Status: AC
  Administered 2023-04-17: 25 g via INTRAVENOUS
  Filled 2023-04-17: qty 100

## 2023-04-17 NOTE — Consult Note (Signed)
 Marland Kitchen   HEMATOLOGY/ONCOLOGY CONSULTATION NOTE  Date of Service: 04/17/2023  Patient Care Team: [facility] as PCP - General  CHIEF COMPLAINTS/PURPOSE OF CONSULTATION:  Abnormal coags and elevated LDH in a patient with rheumatoid arthritis and stills disease with COVID-19 infection.  HISTORY OF PRESENTING ILLNESS:   Melissa Little is a wonderful 38 y.o. female who has been referred to [place] by Dr Janee Morn MD for evaluation and management of abnormal coags and elevated LDH in the context of a patient with COVID-19 infection with a background of rheumatoid arthritis and stills disease previously on anakinra and currently with concern for possible sepsis.  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  She had apparently presented during pregnancy with MAS/HLH with fevers rash and arthralgias that also raises the suspicion of new onset stills disease.  Serologies were broadly noted to be negative and her ferritin peak was 12k.  She was treated with anakinra and high-dose steroid taper to good effect.  Patient was admitted to [facility] on 04/14/2023 with diffuse bodyaches/myalgias, nonproductive cough, nausea and emesis sore throat and decreased p.o. intake with generalized weakness. No overt fevers. Patient was noted to have an initial lactic acid of 4.3 which improved after fluid resuscitation. She was noted to be COVID-19 PCR positive and influenza A and B PCR negative RSV PCR negative.  On admission the patient had elevated WBC count of 20-23k, leukocytosis was predominantly neutrophilic with some elevation of basophil's and eosinophils in the context of her known stills disease/rheumatoid arthritis. anemia with a hemoglobin of 10.9 after fluid resuscitation and platelets of in the 70-80k range.  Patient was on 10 mg of prednisone daily and  anakinra 100 mg p.o. daily for maintenance of her rheumatoid arthritis/stills disease on admission.  Patient is being treated with Paxlovid for her COVID-19 infection. Her anakinra has been continued at 100 mg p.o. daily and her prednisone was increased to 40 mg p.o. daily. White counts are improving.  Her hemoglobin levels are normal at 12 and the platelets have improved to the 90k. LDH was done today which was elevated at 1375.  Previous LDH 6 months ago was in the 600s.  This likely represents baseline tissue inflammation in the joints possibly muscles and now currently in the lungs. Patient is not significantly anemic with improving hemoglobin levels which does not indicate active hemolysis.  Her bilirubin levels were also normal which also did not suggest hemolysis.  No new skin rashes. Patient notes no active bleeding issues. Starting to eat. NO Diarrhea or abdominal pain.  Patient's albumin level is less than 1.5 suggesting very poor nutritional status and potential liver dysfunction. She had a DIC panel done on 04/16/2023 which showed an INR of 1.8, APTT of 4.2 fibrinogen of 61 and an elevated D-dimer of 3.67 in the context of COVID-19 infection.   She did have a CT of the chest abdomen pelvis on 04/16/2023 which showed 1. Circumferential bowel wall thickening involving the cecum and mild peri-cecal inflammatory stranding which may reflect changes of an infectious or inflammatory colitis. In the immunocompromised patient, typhlitis should be considered. 2. Anasarca with interstitial pulmonary edema, trace ascites, trace bilateral pleural effusions, and diffuse body wall edema. 3. Moderate hepatomegaly and hepatic steatosis. 4. 17 mm left adrenal adenoma. 5. Shotty bilateral subpectoral and axillary lymphadenopathy which is normal in size but abnormal in  number. This is nonspecific and may be reactive or inflammatory in nature as can be seen with systemic illness, sarcoidosis, or  connective tissue disorders. This appears similar to MRI examination of 09/30/2022.     MEDICAL HISTORY:  Past Medical History:  Diagnosis Date   Anemia    Rheumatoid arthritis (HCC)    Still's disease (HCC)     SURGICAL HISTORY: Past Surgical History:  Procedure Laterality Date   IR US LIVER BIOPSY  10/01/2022   TUBAL LIGATION Bilateral 12/02/2022   Procedure: POST PARTUM TUBAL LIGATION;  Surgeon: Lazaro Arms, MD;  Location: [facility] LD ORS;  Service: Gynecology;  Laterality: Bilateral;    SOCIAL HISTORY: Social History   Socioeconomic History   Marital status: Single    Spouse name: Not on file   Number of children: Not on file   Years of education: Not on file   Highest education level: Not on file  Occupational History   Not on file  Tobacco Use   Smoking status: Never   Smokeless tobacco: Never  Vaping Use   Vaping status: Never Used  Substance and Sexual Activity   Alcohol use: Not Currently    Comment: occasional   Drug use: Not Currently    Types: Marijuana   Sexual activity: Not Currently    Birth control/protection: None  Other Topics Concern   Not on file  Social History Narrative   Not on file   Social Drivers of Health   Financial Resource Strain: Low Risk  (10/26/2022)   Received from [facility]   Overall Financial Resource Strain (CARDIA)    Difficulty of Paying Living Expenses: Not hard at all  Food Insecurity: No Food Insecurity (04/15/2023)   Hunger Vital Sign    Worried About Running Out of Food in the Last Year: Never true    Ran Out of Food in the Last Year: Never true  Transportation Needs: No Transportation Needs (04/15/2023)   PRAPARE - Administrator, Civil Service (Medical): No    Lack of Transportation (Non-Medical): No  Physical Activity: Inactive (10/26/2022)   Received from [facility]   Exercise Vital Sign    Days of Exercise per Week: 0 days    Minutes of Exercise per Session:  0 min  Stress: Stress Concern Present (10/26/2022)   Received from [facility] - Occupational Stress Questionnaire    Feeling of Stress : To some extent  Social Connections: Moderately Isolated (10/26/2022)   Received from [facility]   Social Connection and Isolation Panel [NHANES]    Frequency of Communication with Friends and Family: More than three times a week    Frequency of Social Gatherings with Friends and Family: Once a week    Attends Religious Services: More than 4 times per year    Active Member of [employer] or Organizations: No    Attends Banker Meetings: Never    Marital Status: Never married  Intimate Partner Violence: Not At Risk (04/15/2023)   Humiliation, Afraid, Rape, and Kick questionnaire    Fear of Current or Ex-Partner: No    Emotionally Abused: No    Physically Abused: No    Sexually Abused: No    FAMILY HISTORY: Family History  Problem Relation Age of Onset   Diabetes Mother    Diabetes Father    COPD Father    Lung cancer Paternal Grandfather  ALLERGIES:  has no known allergies.  MEDICATIONS:  Current Facility-Administered Medications  Medication Dose Route Frequency Provider Last Rate Last Admin   acetaminophen (TYLENOL) tablet 650 mg  650 mg Oral Q6H PRN Rodolph Bong, MD   650 mg at 04/15/23 1752   Or   acetaminophen (TYLENOL) suppository 650 mg  650 mg Rectal Q6H PRN Rodolph Bong, MD       albumin human 25 % solution 25 g  25 g Intravenous Q6H Rodolph Bong, MD       anakinra Reita Cliche) injection 100 mg  100 mg Subcutaneous Q24H Rodolph Bong, MD   100 mg at 04/17/23 0005   diphenhydrAMINE (BENADRYL) capsule 25 mg  25 mg Oral Q6H PRN Rodolph Bong, MD   25 mg at 04/15/23 1743   folic acid (FOLVITE) tablet 1 mg  1 mg Oral Daily Rodolph Bong, MD   1 mg at 04/17/23 1478   furosemide (LASIX) injection 40 mg  40 mg Intravenous  Once Rodolph Bong, MD       guaiFENesin-dextromethorphan (ROBITUSSIN DM) 100-10 MG/5ML syrup 10 mL  10 mL Oral Q4H PRN Rodolph Bong, MD   10 mL at 04/15/23 0219   hydrALAZINE (APRESOLINE) injection 10 mg  10 mg Intravenous Q6H PRN Rodolph Bong, MD       ketorolac (TORADOL) 30 MG/ML injection 30 mg  30 mg Intravenous Q6H PRN Rodolph Bong, MD   30 mg at 04/14/23 2100   levalbuterol (XOPENEX) nebulizer solution 0.63 mg  0.63 mg Nebulization Q4H PRN Rodolph Bong, MD       LORazepam (ATIVAN) tablet 1 mg  1 mg Oral Q8H PRN Rodolph Bong, MD   1 mg at 04/17/23 2956   magnesium citrate solution 1 Bottle  1 Bottle Oral Once PRN Rodolph Bong, MD       melatonin tablet 3 mg  3 mg Oral QHS Sundil, Subrina, MD   3 mg at 04/16/23 2259   methocarbamol (ROBAXIN) injection 500 mg  500 mg Intravenous Q6H PRN Rodolph Bong, MD       nirmatrelvir/ritonavir (PAXLOVID) 3 tablet  3 tablet Oral BID Rodolph Bong, MD   3 tablet at 04/17/23 0753   ondansetron [facility]) tablet 4 mg  4 mg Oral Q6H PRN Rodolph Bong, MD       Or   ondansetron [facility]) injection 4 mg  4 mg Intravenous Q6H PRN Rodolph Bong, MD   4 mg at 04/14/23 2054   oxyCODONE (Oxy IR/ROXICODONE) immediate release tablet 5 mg  5 mg Oral Q6H PRN Rodolph Bong, MD   5 mg at 04/16/23 0315   pantoprazole (PROTONIX) injection 40 mg  40 mg Intravenous Q24H Rodolph Bong, MD   40 mg at 04/16/23 2300   phosphorus (K PHOS NEUTRAL) tablet 250 mg  250 mg Oral BID Rodolph Bong, MD   250 mg at 04/17/23 1007   predniSONE (DELTASONE) tablet 40 mg  40 mg Oral Q breakfast Rodolph Bong, MD   40 mg at 04/17/23 2130   senna-docusate (Senokot-S) tablet 1 tablet  1 tablet Oral BID Rodolph Bong, MD   1 tablet at 04/17/23 0751   sodium bicarbonate tablet 650 mg  650 mg Oral BID Rodolph Bong, MD   650 mg at 04/17/23 8657   sodium chloride flush (NS) 0.9 % injection 3 mL  3 mL Intravenous  Q12H Janee Morn,  Lovey Newcomer, MD   3 mL at 04/17/23 1007   sorbitol 70 % solution 30 mL  30 mL Oral Daily PRN Rodolph Bong, MD        REVIEW OF SYSTEMS:    10 Point review of Systems was done is negative except as noted above.  PHYSICAL EXAMINATION: ECOG PERFORMANCE STATUS: 3 - Symptomatic, >50% confined to bed  . Vitals:   04/17/23 0447 04/17/23 1235  BP: (!) 135/93 121/82  Pulse: 89 96  Resp: 17 20  Temp: 97.8 F (36.6 C) 97.9 F (36.6 C)  SpO2: 100% 98%   Filed Weights   04/14/23 1619 04/14/23 2050 04/16/23 0303  Weight: 120 lb 2.4 oz (54.5 kg) 121 lb 14.6 oz (55.3 kg) 129 lb 1.6 oz (58.6 kg)   .Body mass index is 23.61 kg/m.  GENERAL:alert, in no acute distress and comfortable LYMPH:  no palpable lymphadenopathy in the cervical regions LUNGS: clear to auscultation b/l with normal respiratory effort HEART: regular rate & rhythm ABDOMEN:  normoactive bowel sounds , non tender, not distended. Hepatomegaly palpable Extremity: no pedal edema PSYCH: alert & oriented x 3 with fluent speech NEURO: no focal motor/sensory deficits  LABORATORY DATA:  I have reviewed the data as listed  .    Latest Ref Rng & Units 04/17/2023    3:50 AM 04/16/2023    3:32 PM 04/16/2023    4:06 AM  CBC  WBC 4.0 - 10.5 K/uL 13.1   24.5   Hemoglobin 12.0 - 15.0 g/dL 04.5   40.9   Hematocrit 36.0 - 46.0 % 35.7   33.4   Platelets 150 - 400 K/uL 91  97  95     .    Latest Ref Rng & Units 04/17/2023    3:50 AM 04/16/2023    4:06 AM 04/15/2023    3:29 AM  CMP  Glucose 70 - 99 mg/dL 88  89  73   BUN 6 - 20 mg/dL 15  11  8    Creatinine 0.44 - 1.00 mg/dL 8.11  9.14  7.82   Sodium 135 - 145 mmol/L 133  131  132   Potassium 3.5 - 5.1 mmol/L 4.4  3.6  3.6   Chloride 98 - 111 mmol/L 106  105  108   CO2 22 - 32 mmol/L 19  17  20    Calcium 8.9 - 10.3 mg/dL 7.3  7.1  6.7   Total Protein 6.5 - 8.1 g/dL  4.7  4.7   Total Bilirubin 0.0 - 1.2 mg/dL  0.6  0.6   Alkaline Phos 38 - 126 U/L  131  105    AST 15 - 41 U/L  80  93   ALT 0 - 44 U/L  27  25      RADIOGRAPHIC STUDIES: I have personally reviewed the radiological images as listed and agreed with the findings in the report. CT CHEST ABDOMEN PELVIS W CONTRAST Result Date: 04/16/2023 CLINICAL DATA:  Progressive leukocytosis, nausea, dyspnea EXAM: CT CHEST, ABDOMEN, AND PELVIS WITH CONTRAST TECHNIQUE: Multidetector CT imaging of the chest, abdomen and pelvis was performed following the standard protocol during bolus administration of intravenous contrast. RADIATION DOSE REDUCTION: This exam was performed according to the departmental dose-optimization program which includes automated exposure control, adjustment of the mA and/or kV according to patient size and/or use of iterative reconstruction technique. CONTRAST:  75mL OMNIPAQUE IOHEXOL 350 MG/ML SOLN COMPARISON:  None Available. FINDINGS: CT CHEST FINDINGS Cardiovascular: No significant vascular  findings. Normal heart size. No pericardial effusion. Mediastinum/Nodes: There is shotty bilateral subpectoral and axillary lymphadenopathy which is normal in size but abnormal in number. This is nonspecific and may be reactive or inflammatory in nature as can be seen with systemic illness, sarcoidosis, or connective tissue disorders. This appears similar to MRI examination of 09/30/2022. Visualized thyroid is unremarkable. Esophagus is unremarkable. Lungs/Pleura: Trace interstitial pulmonary edema. Trace bilateral pleural effusions. No pneumothorax. No confluent pulmonary infiltrate. No central obstructing lesion. Musculoskeletal: No chest wall mass or suspicious bone lesions identified. CT ABDOMEN PELVIS FINDINGS Hepatobiliary: Moderate hepatomegaly with liver measuring 22.9 cm in craniocaudal dimension. No enhancing intrahepatic mass. Moderate hepatic steatosis. Gallbladder unremarkable. No intra or extrahepatic biliary ductal dilation. Pancreas: Unremarkable. No pancreatic ductal dilatation or  surrounding inflammatory changes. Spleen: Normal in size without focal abnormality. Adrenals/Urinary Tract: 17 mm nodule is seen within the left adrenal gland previously characterized as a benign adrenal adenoma on MRI examination of 09/12 4. No follow-up imaging is recommended. Right adrenal gland is unremarkable. Kidneys are unremarkable. Bladder is unremarkable. Stomach/Bowel: There is circumferential bowel wall thickening involving the cecum and mild peri-cecal inflammatory stranding which may reflect changes of an infectious or inflammatory colitis. In the immunocompromised patient, typhlitis should be considered. The stomach, small, large bowel are otherwise. No evidence of obstruction. Appendix normal. No free intraperitoneal gas. Trace ascites. Vascular/Lymphatic: No significant vascular findings are present. No enlarged abdominal or pelvic lymph nodes. Reproductive: Uterus and bilateral adnexa are unremarkable. Other: Moderate diffuse subcutaneous body wall edema. Musculoskeletal: No acute bone abnormality. No lytic or blastic bone lesion. IMPRESSION: 1. Circumferential bowel wall thickening involving the cecum and mild peri-cecal inflammatory stranding which may reflect changes of an infectious or inflammatory colitis. In the immunocompromised patient, typhlitis should be considered. 2. Anasarca with interstitial pulmonary edema, trace ascites, trace bilateral pleural effusions, and diffuse body wall edema. 3. Moderate hepatomegaly and hepatic steatosis. 4. 17 mm left adrenal adenoma. 5. Shotty bilateral subpectoral and axillary lymphadenopathy which is normal in size but abnormal in number. This is nonspecific and may be reactive or inflammatory in nature as can be seen with systemic illness, sarcoidosis, or connective tissue disorders. This appears similar to MRI examination of 09/30/2022. Electronically Signed   By: Helyn Numbers M.D.   On: 04/16/2023 23:07   DG CHEST PORT 1 VIEW Result Date:  04/16/2023 CLINICAL DATA:  Leukocytosis. EXAM: PORTABLE CHEST 1 VIEW COMPARISON:  Chest x-ray dated April 14, 2023. FINDINGS: The heart size and mediastinal contours are within normal limits. Both lungs are clear. The visualized skeletal structures are unremarkable. IMPRESSION: No active disease. Electronically Signed   By: Obie Dredge M.D.   On: 04/16/2023 12:58   DG Chest 2 View Result Date: 04/14/2023 CLINICAL DATA:  cough, rhonchi, possible flu/PNA. EXAM: CHEST - 2 VIEW COMPARISON:  09/28/2022. FINDINGS: Bilateral lung fields are clear. Bilateral costophrenic angles are clear. Normal cardio-mediastinal silhouette. No acute osseous abnormalities. The soft tissues are within normal limits. IMPRESSION: No active cardiopulmonary disease. Electronically Signed   By: Jules Schick M.D.   On: 04/14/2023 13:38    ASSESSMENT & PLAN:    38 year old patient with history of stills disease with previous MAS/HLH thought to be associate with stills disease while pregnant last year.  Admitted with COVID-19 infection and failure to thrive with poor p.o. intake and possible sepsis.  #1 COVID-19 infection on Paxlovid  #2 history of stills disease on chronic anakinra maintenance and chronic steroids. #3 circumferential bowel wall thickening  involving the cecum with pericecal inflammatory standing rule out inflammatory colitis. Plan -Appreciate hospital medicine and infectious disease management of these considerations. -Further treatment and workup of bowel wall thickening per primary team.  #4 leukocytosis with neutrophilia basophilia and some eosinophilia.  Likely reactive and related to COVID-19 infection possible bowel inflammation, underlying stills disease and possibly being off anakinra for few days. Unlikely to be a clonal bone marrow disorder. WBC counts already improving with increased dose of steroids and treatment of her COVID-19 infection and being back on anakinra. No additional clonal workup  or bone marrow biopsy indicated at this time. On stress dose steroids in the setting of acute infection and previous chronic use of steroids. ID is ruling out other atypical infections in the context of chronic steroid use and immunosuppression.  #5 thrombocytopenia platelets were around 70k on admission and now have improved to 90+k #6 rule out mast cell activation/HLH #7 mild anemia of chronic inflammation.  Hemoglobin is stable at 12. #8  Elevated LDH appears to be from possible lung inflammation or chronic inflammation of other tissues.  No evidence of hemolysis with stable hemoglobin levels normal bilirubin levels. This is unlikely etiology of the patient's leukocytosis. Patient has not developed any other progressive pancytopenia's. -Will check ferritin levels and triglyceride levels to have a baseline  #9 adult onset stills disease -On anakinra maintenance and currently on stress dose steroids. -Can call and connect with patient's rheumatologist tomorrow at [facility] for any additional recommendations.  #10 elevated PT, APTT and D-dimer and reduced fibrinogen levels. This is concerning for DIC in the context of chronic inflammation of her current acute infection..Though the lab tech did not see any schistocytes there are a few. There is also stability that patient additionally has vitamin K deficiency and also decreased fibrinogen and clotting factor production due to chronic liver disease. CT abdomen pelvis shows hepatomegaly with significant hepatic steatosis. And nutritionally the patient has severe protein calorie malnutrition with an albumin level of less than 1.5. Other additional factors would be COVID 19 related coagulopathy as well as the use of anakinra in the context of chronic inflammation can reduce fibrinogen levels. Plan -Would repeat DIC panel ordered. -Would recommend starting the patient on vitamin K 5 mg p.o. daily for 5 days. -Replace fibrinogen to levels of more than  100 to reduce risk of bleeding or to a level of 150 mg/dL if active bleeding noted. -1 unit of cryoprecipitate per 10 kg body weight would increase levels by 50 mg/dL. -If actively bleeding will also need to give 2 units of FFP as needed.  -Case discussed with Dr Janee Morn.  .The total time spent in the appointment was 80 minutes* .  All of the patient's questions were answered with apparent satisfaction. The patient knows to call the clinic with any problems, questions or concerns.   Wyvonnia Lora MD MS AAHIVMS [facility] Hematology/Oncology Physician [facility]  .*Total Encounter Time as defined by the Centers for Medicare and Medicaid Services includes, in addition to the face-to-face time of a patient visit (documented in the note above) non-face-to-face time: obtaining and reviewing outside history, ordering and reviewing medications, tests or procedures, care coordination (communications with other health care professionals or caregivers) and documentation in the medical record.  04/17/2023 12:38 PM

## 2023-04-17 NOTE — Progress Notes (Signed)
 [facility]  Date of Admission:  04/14/2023     Abx: 3/27-c cefepime 3/27-c vanc   3/27-c paxlovid                                                             Other: 3/27-c prednisone 20 mg daily   Assessment: 38 yo female with recently dx'ed adults stills disease, rheumatoid arthritis, on chronic steroids and anakinra, admitted for 3 daydiffuse myalgia, body ache, nonpreductive cough, n/v, sorethroat, malaise, found to have positive covid pcr, course with persistent leukocytosis   Other issues noted: Thrombocytopenia Very high eosinophilia on differential; no blasts; neutrophil predominance and also slight basophilia; low grade monocytosis improving       Afebrile on exam but wbc 23-25 Covid pcr positive (full resp pcr panel not done)   Cxr is clear and on room air   Non-anion gap metabolic acidosis persistent; normal renal function         3/27 bcx ngt 3/27 ucx ecoli only 60k; no uti sx   Ra/stills disease: Followed by [facility] -- last seen 11/2022 Steroid -- prednisone 80 mg 10/08/22 long tapered course tapered to 10 mg daily since 11/30/22 Anakinra since 10/06/2022 daily 100 mg daily maintenance         Given her recent high dose steroid use, pjp risk remains high potentiall up to 6 months after dose decreased below 15 mg daily from 11/2022 Anakinra by itself shouldn't add any infectious risk So OI risk would be pjp, potentially invasive fungal disease but less likely, and common bacterial pneumonia including nocardia/legionellosis     So far the only objective finding is leukocytosis and thrombocytopenia with mild lft elevation and covid positive on pcr. The later two could be sepsis Dic considered although blood smear read without schistocytes She does have rather marked eosinophilia on differential suggestive potential AI   I would finish covid treatment, try to give her stress dose steroid for 5-7 days   I  have low suspicion that she has flare of stills disease/hlh but something to consider if above management doesn't work. Also would review drug related cause for leukocytosis/eosinophilia as well       Consider abd pelv chest ct as well in setting OI mentioned   ------------------- 04/17/23 assessment Prednisone dose increased to 40 mg daily and first dose given yesterday; leukocytosis significantly improved along with resolved eosinophilia Systemic abx also stopped yesterday as no evidence thus far of microbiologic or clinical bacterial infectious process   3/29 abd pelv chest ct with contrast nonspecific cecal area bowel thickening but not neutropenic and no gi sx otherwise  Thrombocytopenia has been persisent along with significantly elevated ldh 1300 without significant lft changes or known tissue damage (outside of nonspecific cecal changes on ct scan), and also depressed fibrinogen and elevated inr/aptt. Given recent HLH/MAS and tapering prednisone, I am concern about a potential dic process or (less likely) MAS. Would probably benefit from hematology review of labs/case. She only has mild lft elevation and hasn't had fever but on prednisone. No sign of ttp/hus     Plan: Please discuss case with hematology/oncology about recent MAS/hlh, tapering steroid and now labs c/w dic  Would continue stress dose steroid for another couple  days and then decrease to home dose Covid management per primary team Continue airborn/droplet isolation Discussed with primary team      Principal Problem:   COVID-19 Active Problems:   Anxiety   Adult-onset Still's disease (HCC)   SIRS (systemic inflammatory response syndrome) (HCC)   Dehydration   Hyponatremia   Hypokalemia   UTI (urinary tract infection)   Hypomagnesemia   Hypophosphatemia   Lactic acidosis   Leukocytosis   Acute encephalopathy   No Known Allergies  Scheduled Meds:  anakinra  100 mg Subcutaneous Q24H   folic acid  1 mg  Oral Daily   melatonin  3 mg Oral QHS   nirmatrelvir/ritonavir  3 tablet Oral BID   pantoprazole (PROTONIX) IV  40 mg Intravenous Q24H   phosphorus  250 mg Oral BID   predniSONE  40 mg Oral Q breakfast   senna-docusate  1 tablet Oral BID   sodium bicarbonate  650 mg Oral BID   sodium chloride flush  3 mL Intravenous Q12H   Continuous Infusions:  albumin human     vancomycin 750 mg (04/17/23 0929)   PRN Meds:.acetaminophen **OR** acetaminophen, diphenhydrAMINE, guaiFENesin-dextromethorphan, hydrALAZINE, ketorolac, levalbuterol, LORazepam, magnesium citrate, methocarbamol (ROBAXIN) injection, ondansetron **OR** ondansetron (ZOFRAN) IV, oxyCODONE, sorbitol   SUBJECTIVE: Chart reviewed   Review of Systems: ROS All other ROS was negative, except mentioned above     OBJECTIVE: Vitals:   04/16/23 1505 04/16/23 2000 04/17/23 0004 04/17/23 0447  BP: (!) 153/109 (!) 134/99 115/88 (!) 135/93  Pulse: 80 78 89 89  Resp: 14 18 20 17   Temp: 97.8 F (36.6 C) 97.8 F (36.6 C) 97.8 F (36.6 C) 97.8 F (36.6 C)  TempSrc: Oral Oral Oral Oral  SpO2: 98% 98% 99% 100%  Weight:      Height:       Body mass index is 23.61 kg/m.  Physical Exam Chart reviewed  Lab Results Lab Results  Component Value Date   WBC 13.1 (H) 04/17/2023   HGB 12.2 04/17/2023   HCT 35.7 (L) 04/17/2023   MCV 73.5 (L) 04/17/2023   PLT 91 (L) 04/17/2023    Lab Results  Component Value Date   CREATININE 0.78 04/17/2023   BUN 15 04/17/2023   NA 133 (L) 04/17/2023   K 4.4 04/17/2023   CL 106 04/17/2023   CO2 19 (L) 04/17/2023    Lab Results  Component Value Date   ALT 27 04/16/2023   AST 80 (H) 04/16/2023   ALKPHOS 131 (H) 04/16/2023   BILITOT 0.6 04/16/2023      Microbiology: Recent Results (from the past 240 hours)  Resp panel by RT-PCR (RSV, Flu A&B, Covid) Anterior Nasal Swab     Status: Abnormal   Collection Time: 04/14/23 12:36 PM   Specimen: Anterior Nasal Swab  Result Value Ref Range  Status   SARS Coronavirus 2 by RT PCR POSITIVE (A) NEGATIVE Final   Influenza A by PCR NEGATIVE NEGATIVE Final   Influenza B by PCR NEGATIVE NEGATIVE Final    Comment: (NOTE) The Xpert Xpress SARS-CoV-2/FLU/RSV plus assay is intended as an aid in the diagnosis of influenza from Nasopharyngeal swab specimens and should not be used as a sole basis for treatment. Nasal washings and aspirates are unacceptable for Xpert Xpress SARS-CoV-2/FLU/RSV testing.  Fact Sheet for Patients: BloggerCourse.com  Fact Sheet for Healthcare Providers: SeriousBroker.it  This test is not yet approved or cleared by the [place] FDA and has been authorized for detection and/or diagnosis of  SARS-CoV-2 by FDA under an Emergency Use Authorization (EUA). This EUA will remain in effect (meaning this test can be used) for the duration of the COVID-19 declaration under Section 564(b)(1) of the Act, 21 U.S.C. section 360bbb-3(b)(1), unless the authorization is terminated or revoked.     Resp Syncytial Virus by PCR NEGATIVE NEGATIVE Final    Comment: (NOTE) Fact Sheet for Patients: BloggerCourse.com  Fact Sheet for Healthcare Providers: SeriousBroker.it  This test is not yet approved or cleared by the [place] FDA and has been authorized for detection and/or diagnosis of SARS-CoV-2 by FDA under an Emergency Use Authorization (EUA). This EUA will remain in effect (meaning this test can be used) for the duration of the COVID-19 declaration under Section 564(b)(1) of the Act, 21 U.S.C. section 360bbb-3(b)(1), unless the authorization is terminated or revoked.  Performed at [facility], [address], [place], [place] [address]   Culture, blood (routine x 2)     Status: None (Preliminary result)   Collection Time: 04/14/23 12:37 PM   Specimen: BLOOD RIGHT FOREARM  Result Value Ref Range Status    Specimen Description BLOOD RIGHT FOREARM  Final   Special Requests   Final    BOTTLES DRAWN AEROBIC AND ANAEROBIC Blood Culture adequate volume   Culture   Final    NO GROWTH 3 DAYS Performed at [facility], [address], [place], [place] [address]    Report Status PENDING  Incomplete  Culture, blood (routine x 2)     Status: None (Preliminary result)   Collection Time: 04/14/23 12:42 PM   Specimen: BLOOD  Result Value Ref Range Status   Specimen Description BLOOD RIGHT ANTECUBITAL  Final   Special Requests   Final    BOTTLES DRAWN AEROBIC AND ANAEROBIC Blood Culture adequate volume   Culture   Final    NO GROWTH 3 DAYS Performed at [facility], [address], [place], [place] [address]    Report Status PENDING  Incomplete  Urine Culture     Status: Abnormal (Preliminary result)   Collection Time: 04/14/23  4:10 PM   Specimen: Urine, Clean Catch  Result Value Ref Range Status   Specimen Description URINE, CLEAN CATCH  Final   Special Requests Immunocompromised  Final   Culture (A)  Final    60,000 COLONIES/mL ESCHERICHIA COLI >=100,000 COLONIES/mL STREPTOCOCCUS ANGINOSIS SUSCEPTIBILITIES TO FOLLOW Performed at [facility], [address], [place], [place] [address]    Report Status PENDING  Incomplete   Organism ID, Bacteria ESCHERICHIA COLI (A)  Final      Susceptibility   Escherichia coli - MIC*    AMPICILLIN >=32 RESISTANT Resistant     CEFAZOLIN <=4 SENSITIVE Sensitive     CEFEPIME <=0.12 SENSITIVE Sensitive     CEFTRIAXONE <=0.25 SENSITIVE Sensitive     CIPROFLOXACIN <=0.25 SENSITIVE Sensitive     GENTAMICIN <=1 SENSITIVE Sensitive     IMIPENEM <=0.25 SENSITIVE Sensitive     NITROFURANTOIN <=16 SENSITIVE Sensitive     TRIMETH/SULFA <=20 SENSITIVE Sensitive     AMPICILLIN/SULBACTAM 8 SENSITIVE Sensitive     PIP/TAZO <=4 SENSITIVE Sensitive ug/mL    * 60,000 COLONIES/mL ESCHERICHIA COLI     Serology:   Imaging: If present, new  imagings (plain films, ct scans, and mri) have been personally visualized and interpreted; radiology reports have been reviewed. Decision making incorporated into the Impression / Recommendations.  3/29 abd pelv chest ct with contrast 1. Circumferential  bowel wall thickening involving the cecum and mild peri-cecal inflammatory stranding which may reflect changes of an infectious or inflammatory colitis. In the immunocompromised patient, typhlitis should be considered. 2. Anasarca with interstitial pulmonary edema, trace ascites, trace bilateral pleural effusions, and diffuse body wall edema. 3. Moderate hepatomegaly and hepatic steatosis. 4. 17 mm left adrenal adenoma. 5. Shotty bilateral subpectoral and axillary lymphadenopathy which is normal in size but abnormal in number. This is nonspecific and may be reactive or inflammatory in nature as can be seen with systemic illness, sarcoidosis, or connective tissue disorders. This appears similar to MRI examination of 09/30/2022.   Raymondo Band, MD [facility] [phone] pager    04/17/2023, 10:13 AM

## 2023-04-17 NOTE — Progress Notes (Signed)
 PROGRESS NOTE    Melissa Little  XBJ:[id number] DOB: 05-04-1985 DOA: 04/14/2023 PCP: [facility]    Chief Complaint  Patient presents with   Nausea   Shortness of Breath    Brief Narrative:  Patient 38 year old female history of adult stills disease, rheumatoid arthritis on chronic steroids and anakinra presented to ED with 3-day history of flulike symptoms.  Workup in the ED consistent with COVID-19 infection.  Patient also noted to have a SIRS picture and pancultured and placed empirically on IV antibiotics pending culture results.  Patient also started on Paxlovid.   Assessment & Plan:   Principal Problem:   COVID-19 Active Problems:   SIRS (systemic inflammatory response syndrome) (HCC)   Anxiety   Adult-onset Still's disease (HCC)   Dehydration   Hyponatremia   Hypokalemia   UTI (urinary tract infection)   Hypomagnesemia   Hypophosphatemia   Lactic acidosis   Leukocytosis   Acute encephalopathy   Anasarca  #1 COVID-19 infection -Patient presented with 3-day history of myalgias, chills, shortness of breath, nonproductive cough, sore throat, decreased oral intake. -COVID-19 PCR positive.   Influenza A and B PCR negative.    RSV by PCR negative. -CRP elevated and was trending up but trending back down currently at 6.6 from 11.8 from 10 from 9.2.  -Patient afebrile, patient with sats of 99-100% on room air. -Patient with history of stills disease on chronic steroids and anakinra and as such patient started on Paxlovid which we will continue.   -Increased prednisone to 40 mg daily.   -Supportive care.    2.  SIRS/leukocytosis -Patient on presentation met criteria for SIRS as patient noted to be tachycardic, tachypneic, have a significant leukocytosis and the elevated lactic acid level. -Likely secondary to problem #1. -Chest x-ray done with no acute infiltrate. -Blood cultures ordered and pending with no growth to date x 3 days.. -UA with  concerns for possible UTI.   -Urine cultures with 60,000 colonies of E. coli.   -Lactic acidosis resolved with hydration.   -Due to history of stills disease, on chronic steroid and DMARD patient placed empirically on IV vancomycin, IV cefepime.   -Patient with a worsening leukocytosis and as such seen in consultation by ID who recommended CT chest abdomen and pelvis which was done.   -ID also recommended discontinuation of antibiotics.   -Leukocytosis trending down as well as eosinophilia.   -ID following appreciate input and recommendations.  3.??  DIC -Patient noted with a elevated LDH of 1375, fibrinogen decreased at 61, no schistocytes noted. -Patient with a thrombocytopenia but improving, noted to have an anemia but hemoglobin up to 12.2 today. -Patient with no overt bleeding. -May be secondary to acute infection of COVID-19. -Will consult with hematology for further evaluation and management.,  4.  Lactic acidosis -Likely secondary to dehydration. -Resolved with hydration.    5.  Dehydration -IV fluids. -Saline lock IV fluids.   6.  Hypokalemia/hypophosphatemia/hypomagnesemia -Secondary to GI losses. -Magnesium noted at 2.1, phosphorus at 2.4, potassium at 4.4.Marland Kitchen   -Repeat labs in the AM.   7.  Hyponatremia -Likely secondary to hypovolemic hyponatremia. -Urine sodium of 25, urine creatinine 111, urine osmolality 406.   -Serum osmolality 276.   -Improved with hydration. -Saline lock IV fluids. -Patient to receive IV Lasix today. -Repeat labs in the AM.   8.  History of stills disease/rheumatoid arthritis -Due to concern for acute infection home regimen prednisone increased to 20 mg daily on admission. -Increased  prednisone to 40 mg daily..   -Continue home regimen anakinra. -Outpatient follow-up with rheumatology.   9.  Thrombocytopenia -Likely secondary to acute infection. -Patient with no overt bleeding. -Slowly trending back up. -Follow.   10. microcytic  anemia/folate deficiency -Likely iron deficiency anemia.  Also likely partly dilutional. -Patient with no overt bleeding. -Anemia panel with iron level of 38, folate of 3.4, ferritin > 7500. -Continue folate 1 mg daily.   -Follow H&H. -Transfusion threshold hemoglobin < 7.  11.  E. coli UTI -Urine cultures with 60,000 colonies of E. coli.   -Patient presenting with SIRS picture, immunocompromised on chronic steroids.  -Status post 3 days IV cefepime.  12.  Anasarca--per CT abdomen and pelvis -Patient with noted anasarca with interstitial pulmonary edema, trace ascites, trace bilateral pleural effusions and diffuse body wall edema on CT abdomen and pelvis. -Likely secondary to hypoalbuminemia as patient with a albumin level < 1.5. -Placed on Lasix 40 mg IV every 12 hours x 2 doses. -Will place on IV albumin every 6 hours x 24 hours. -Monitor urine output. -Ensure supplementation.  13.??  Colitis -CT abdomen and pelvis after differential bowel wall thickening involving the cecum and mild pericecal inflammatory stranding which may reflect changes of an infectious or inflammatory colitis. -Patient denies any diarrhea, denies any abdominal pain. -Status post 3 days IV cefepime. -Follow.  14.  Acute metabolic encephalopathy -Patient noted to have a bout of encephalopathy yesterday evening when she was not communicating however following commands and nodding head yes or no. -Given a dose of Ativan yesterday with clinical improvement. -Patient with no focal neurological deficits. -Mental status improved currently close to baseline.   DVT prophylaxis: SCDs Code Status: Full Family Communication: Updated patient.  No family at bedside. Disposition: Home when clinically improved  Status is: Inpatient Remains inpatient appropriate because: Severity of illness   Consultants:  ID: Dr. Renold Don 04/16/2023  Procedures:  Chest x-ray 04/14/2023 CT chest/abdomen/pelvis  04/16/2023  Antimicrobials:  Anti-infectives (From admission, onward)    Start     Dose/Rate Route Frequency Ordered Stop   04/16/23 1800  ceFEPIme (MAXIPIME) 2 g in sodium chloride 0.9 % 100 mL IVPB        2 g 200 mL/hr over 30 Minutes Intravenous Every 8 hours 04/16/23 1400 04/16/23 2055   04/15/23 0600  vancomycin (VANCOCIN) IVPB 750 mg/150 ml premix  Status:  Discontinued       Placed in "Followed by" Linked Group   750 mg 150 mL/hr over 60 Minutes Intravenous Every 12 hours 04/14/23 1704 04/15/23 0140   04/15/23 0600  vancomycin (VANCOREADY) IVPB 750 mg/150 mL  Status:  Discontinued        750 mg 150 mL/hr over 60 Minutes Intravenous Every 12 hours 04/15/23 0141 04/17/23 1025   04/14/23 2200  nirmatrelvir/ritonavir (PAXLOVID) 3 tablet        3 tablet Oral 2 times daily 04/14/23 1627 04/19/23 2159   04/14/23 1800  ceFEPIme (MAXIPIME) 2 g in sodium chloride 0.9 % 100 mL IVPB  Status:  Discontinued        2 g 200 mL/hr over 30 Minutes Intravenous Every 8 hours 04/14/23 1704 04/16/23 1400   04/14/23 1800  Vancomycin (VANCOCIN) 1,250 mg in sodium chloride 0.9 % 250 mL IVPB       Placed in "Followed by" Linked Group   1,250 mg 166.7 mL/hr over 90 Minutes Intravenous  Once 04/14/23 1704 04/14/23 1935         Subjective:  Patient lying in bed.  Arousable.  Alert and oriented.  Denies any chest pain or shortness of breath.  No abdominal pain.  Denies any diarrhea.  No dysuria.  Overall feels well.  More communicative this morning than she was yesterday evening mental status seems at baseline.   Objective: Vitals:   04/16/23 1505 04/16/23 2000 04/17/23 0004 04/17/23 0447  BP: (!) 153/109 (!) 134/99 115/88 (!) 135/93  Pulse: 80 78 89 89  Resp: 14 18 20 17   Temp: 97.8 F (36.6 C) 97.8 F (36.6 C) 97.8 F (36.6 C) 97.8 F (36.6 C)  TempSrc: Oral Oral Oral Oral  SpO2: 98% 98% 99% 100%  Weight:      Height:        Intake/Output Summary (Last 24 hours) at 04/17/2023 1038 Last  data filed at 04/16/2023 1842 Gross per 24 hour  Intake 403.56 ml  Output --  Net 403.56 ml   Filed Weights   04/14/23 1619 04/14/23 2050 04/16/23 0303  Weight: 54.5 kg 55.3 kg 58.6 kg    Examination:  General exam: NAD. Respiratory system: Clear to auscultation bilaterally.  No wheezes, no crackles, no rhonchi.  Fair air movement.  Speaking in full sentences.  Cardiovascular system: Regular rate rhythm no murmurs rubs or gallops.  No JVD.  No pitting lower extremity edema. Gastrointestinal system: Abdomen is soft, nontender, nondistended, positive bowel sounds.  No rebound.  No guarding.   Central nervous system: Alert and oriented. No focal neurological deficits. Extremities: Symmetric 5 x 5 power. Skin: No rashes, lesions or ulcers Psychiatry: Judgement and insight appear normal. Mood & affect appropriate.     Data Reviewed: I have personally reviewed following labs and imaging studies  CBC: Recent Labs  Lab 04/14/23 1237 04/15/23 0329 04/16/23 0406 04/16/23 1532 04/17/23 0350  WBC 22.0* 23.2* 24.5*  --  13.1*  NEUTROABS 19.8* 14.2* 17.1*  --  8.2*  HGB 13.0 10.9* 11.3*  --  12.2  HCT 39.1 32.2* 33.4*  --  35.7*  MCV 75.0* 75.2* 75.2*  --  73.5*  PLT 84* 76* 95* 97* 91*    Basic Metabolic Panel: Recent Labs  Lab 04/14/23 1237 04/14/23 1722 04/15/23 0329 04/16/23 0406 04/17/23 0350  NA 127*  --  132* 131* 133*  K 3.2*  --  3.6 3.6 4.4  CL 95*  --  108 105 106  CO2 21*  --  20* 17* 19*  GLUCOSE 78  --  73 89 88  BUN 8  --  8 11 15   CREATININE 0.65  --  0.69 0.78 0.78  CALCIUM 6.9*  --  6.7* 7.1* 7.3*  MG  --  1.4* 1.5* 2.1  --   PHOS  --   --  2.4*  --   --     GFR: Estimated Creatinine Clearance: 75.4 mL/min (by C-G formula based on SCr of 0.78 mg/dL).  Liver Function Tests: Recent Labs  Lab 04/14/23 1237 04/15/23 0329 04/16/23 0406  AST 109* 93* 80*  ALT 25 25 27   ALKPHOS 128* 105 131*  BILITOT 0.4 0.6 0.6  PROT 5.5* 4.7* 4.7*  ALBUMIN  1.6* <1.5* <1.5*    CBG: No results for input(s): "GLUCAP" in the last 168 hours.   Recent Results (from the past 240 hours)  Resp panel by RT-PCR (RSV, Flu A&B, Covid) Anterior Nasal Swab     Status: Abnormal   Collection Time: 04/14/23 12:36 PM   Specimen: Anterior Nasal Swab  Result Value  Ref Range Status   SARS Coronavirus 2 by RT PCR POSITIVE (A) NEGATIVE Final   Influenza A by PCR NEGATIVE NEGATIVE Final   Influenza B by PCR NEGATIVE NEGATIVE Final    Comment: (NOTE) The Xpert Xpress SARS-CoV-2/FLU/RSV plus assay is intended as an aid in the diagnosis of influenza from Nasopharyngeal swab specimens and should not be used as a sole basis for treatment. Nasal washings and aspirates are unacceptable for Xpert Xpress SARS-CoV-2/FLU/RSV testing.  Fact Sheet for Patients: BloggerCourse.com  Fact Sheet for Healthcare Providers: SeriousBroker.it  This test is not yet approved or cleared by the [place] FDA and has been authorized for detection and/or diagnosis of SARS-CoV-2 by FDA under an Emergency Use Authorization (EUA). This EUA will remain in effect (meaning this test can be used) for the duration of the COVID-19 declaration under Section 564(b)(1) of the Act, 21 U.S.C. section 360bbb-3(b)(1), unless the authorization is terminated or revoked.     Resp Syncytial Virus by PCR NEGATIVE NEGATIVE Final    Comment: (NOTE) Fact Sheet for Patients: BloggerCourse.com  Fact Sheet for Healthcare Providers: SeriousBroker.it  This test is not yet approved or cleared by the [place] FDA and has been authorized for detection and/or diagnosis of SARS-CoV-2 by FDA under an Emergency Use Authorization (EUA). This EUA will remain in effect (meaning this test can be used) for the duration of the COVID-19 declaration under Section 564(b)(1) of the Act, 21 U.S.C. section  360bbb-3(b)(1), unless the authorization is terminated or revoked.  Performed at [facility], [address] [place], [place] [address]   Culture, blood (routine x 2)     Status: None (Preliminary result)   Collection Time: 04/14/23 12:37 PM   Specimen: BLOOD RIGHT FOREARM  Result Value Ref Range Status   Specimen Description BLOOD RIGHT FOREARM  Final   Special Requests   Final    BOTTLES DRAWN AEROBIC AND ANAEROBIC Blood Culture adequate volume   Culture   Final    NO GROWTH 3 DAYS Performed at [facility], [address] [place], [place] [address]    Report Status PENDING  Incomplete  Culture, blood (routine x 2)     Status: None (Preliminary result)   Collection Time: 04/14/23 12:42 PM   Specimen: BLOOD  Result Value Ref Range Status   Specimen Description BLOOD RIGHT ANTECUBITAL  Final   Special Requests   Final    BOTTLES DRAWN AEROBIC AND ANAEROBIC Blood Culture adequate volume   Culture   Final    NO GROWTH 3 DAYS Performed at [facility], [address] [place], [place] [address]    Report Status PENDING  Incomplete  Urine Culture     Status: Abnormal (Preliminary result)   Collection Time: 04/14/23  4:10 PM   Specimen: Urine, Clean Catch  Result Value Ref Range Status   Specimen Description URINE, CLEAN CATCH  Final   Special Requests Immunocompromised  Final   Culture (A)  Final    60,000 COLONIES/mL ESCHERICHIA COLI >=100,000 COLONIES/mL STREPTOCOCCUS ANGINOSIS SUSCEPTIBILITIES TO FOLLOW Performed at [facility], [address], [place], [place] [address]    Report Status PENDING  Incomplete   Organism ID, Bacteria ESCHERICHIA COLI (A)  Final      Susceptibility   Escherichia coli - MIC*    AMPICILLIN >=32 RESISTANT Resistant     CEFAZOLIN <=4 SENSITIVE Sensitive     CEFEPIME <=0.12 SENSITIVE Sensitive     CEFTRIAXONE <=  0.25 SENSITIVE Sensitive     CIPROFLOXACIN <=0.25 SENSITIVE Sensitive     GENTAMICIN <=1 SENSITIVE  Sensitive     IMIPENEM <=0.25 SENSITIVE Sensitive     NITROFURANTOIN <=16 SENSITIVE Sensitive     TRIMETH/SULFA <=20 SENSITIVE Sensitive     AMPICILLIN/SULBACTAM 8 SENSITIVE Sensitive     PIP/TAZO <=4 SENSITIVE Sensitive ug/mL    * 60,000 COLONIES/mL ESCHERICHIA COLI         Radiology Studies: CT CHEST ABDOMEN PELVIS W CONTRAST Result Date: 04/16/2023 CLINICAL DATA:  Progressive leukocytosis, nausea, dyspnea EXAM: CT CHEST, ABDOMEN, AND PELVIS WITH CONTRAST TECHNIQUE: Multidetector CT imaging of the chest, abdomen and pelvis was performed following the standard protocol during bolus administration of intravenous contrast. RADIATION DOSE REDUCTION: This exam was performed according to the departmental dose-optimization program which includes automated exposure control, adjustment of the mA and/or kV according to patient size and/or use of iterative reconstruction technique. CONTRAST:  75mL OMNIPAQUE IOHEXOL 350 MG/ML SOLN COMPARISON:  None Available. FINDINGS: CT CHEST FINDINGS Cardiovascular: No significant vascular findings. Normal heart size. No pericardial effusion. Mediastinum/Nodes: There is shotty bilateral subpectoral and axillary lymphadenopathy which is normal in size but abnormal in number. This is nonspecific and may be reactive or inflammatory in nature as can be seen with systemic illness, sarcoidosis, or connective tissue disorders. This appears similar to MRI examination of 09/30/2022. Visualized thyroid is unremarkable. Esophagus is unremarkable. Lungs/Pleura: Trace interstitial pulmonary edema. Trace bilateral pleural effusions. No pneumothorax. No confluent pulmonary infiltrate. No central obstructing lesion. Musculoskeletal: No chest wall mass or suspicious bone lesions identified. CT ABDOMEN PELVIS FINDINGS Hepatobiliary: Moderate hepatomegaly with liver measuring 22.9 cm in craniocaudal dimension. No enhancing intrahepatic mass. Moderate hepatic steatosis. Gallbladder  unremarkable. No intra or extrahepatic biliary ductal dilation. Pancreas: Unremarkable. No pancreatic ductal dilatation or surrounding inflammatory changes. Spleen: Normal in size without focal abnormality. Adrenals/Urinary Tract: 17 mm nodule is seen within the left adrenal gland previously characterized as a benign adrenal adenoma on MRI examination of 09/12 4. No follow-up imaging is recommended. Right adrenal gland is unremarkable. Kidneys are unremarkable. Bladder is unremarkable. Stomach/Bowel: There is circumferential bowel wall thickening involving the cecum and mild peri-cecal inflammatory stranding which may reflect changes of an infectious or inflammatory colitis. In the immunocompromised patient, typhlitis should be considered. The stomach, small, large bowel are otherwise. No evidence of obstruction. Appendix normal. No free intraperitoneal gas. Trace ascites. Vascular/Lymphatic: No significant vascular findings are present. No enlarged abdominal or pelvic lymph nodes. Reproductive: Uterus and bilateral adnexa are unremarkable. Other: Moderate diffuse subcutaneous body wall edema. Musculoskeletal: No acute bone abnormality. No lytic or blastic bone lesion. IMPRESSION: 1. Circumferential bowel wall thickening involving the cecum and mild peri-cecal inflammatory stranding which may reflect changes of an infectious or inflammatory colitis. In the immunocompromised patient, typhlitis should be considered. 2. Anasarca with interstitial pulmonary edema, trace ascites, trace bilateral pleural effusions, and diffuse body wall edema. 3. Moderate hepatomegaly and hepatic steatosis. 4. 17 mm left adrenal adenoma. 5. Shotty bilateral subpectoral and axillary lymphadenopathy which is normal in size but abnormal in number. This is nonspecific and may be reactive or inflammatory in nature as can be seen with systemic illness, sarcoidosis, or connective tissue disorders. This appears similar to MRI examination of  09/30/2022. Electronically Signed   By: Helyn Numbers M.D.   On: 04/16/2023 23:07   DG CHEST PORT 1 VIEW Result Date: 04/16/2023 CLINICAL DATA:  Leukocytosis. EXAM: PORTABLE CHEST 1 VIEW COMPARISON:  Chest x-ray dated April 14, 2023. FINDINGS: The heart size and mediastinal contours are within normal limits. Both lungs are clear. The visualized skeletal structures are unremarkable. IMPRESSION: No active disease. Electronically Signed   By: Obie Dredge M.D.   On: 04/16/2023 12:58        Scheduled Meds:  anakinra  100 mg Subcutaneous Q24H   folic acid  1 mg Oral Daily   melatonin  3 mg Oral QHS   nirmatrelvir/ritonavir  3 tablet Oral BID   pantoprazole (PROTONIX) IV  40 mg Intravenous Q24H   phosphorus  250 mg Oral BID   predniSONE  40 mg Oral Q breakfast   senna-docusate  1 tablet Oral BID   sodium bicarbonate  650 mg Oral BID   sodium chloride flush  3 mL Intravenous Q12H   Continuous Infusions:     LOS: 3 days    Time spent: 45 mins    Ramiro Harvest, MD [facility]   To contact the attending provider between 7A-7P or the covering provider during after hours 7P-7A, please log into the web site [email or web address] and access using universal [place] password for that web site. If you do not have the password, please call the hospital operator.  04/17/2023, 10:38 AM

## 2023-04-18 DIAGNOSIS — M061 Adult-onset Still's disease: Secondary | ICD-10-CM | POA: Diagnosis not present

## 2023-04-18 DIAGNOSIS — D761 Hemophagocytic lymphohistiocytosis: Secondary | ICD-10-CM

## 2023-04-18 DIAGNOSIS — R7989 Other specified abnormal findings of blood chemistry: Secondary | ICD-10-CM | POA: Diagnosis not present

## 2023-04-18 DIAGNOSIS — E781 Pure hyperglyceridemia: Secondary | ICD-10-CM

## 2023-04-18 DIAGNOSIS — U071 COVID-19: Secondary | ICD-10-CM | POA: Diagnosis not present

## 2023-04-18 DIAGNOSIS — R6510 Systemic inflammatory response syndrome (SIRS) of non-infectious origin without acute organ dysfunction: Secondary | ICD-10-CM | POA: Diagnosis not present

## 2023-04-18 DIAGNOSIS — D721 Eosinophilia, unspecified: Secondary | ICD-10-CM | POA: Diagnosis not present

## 2023-04-18 LAB — BPAM CRYOPRECIPITATE
Blood Product Expiration Date: 202503302359
ISSUE DATE / TIME: 202503302138
Unit Type and Rh: 5100

## 2023-04-18 LAB — CBC WITH DIFFERENTIAL/PLATELET
Abs Immature Granulocytes: 0.36 10*3/uL — ABNORMAL HIGH (ref 0.00–0.07)
Abs Immature Granulocytes: 0.2 10*3/uL — ABNORMAL HIGH (ref 0.00–0.07)
Band Neutrophils: 3 %
Basophils Absolute: 0.1 10*3/uL (ref 0.0–0.1)
Basophils Absolute: 0 10*3/uL (ref 0.0–0.1)
Basophils Relative: 1 %
Basophils Relative: 0 %
Eosinophils Absolute: 1.8 10*3/uL — ABNORMAL HIGH (ref 0.0–0.5)
Eosinophils Absolute: 1.3 10*3/uL — ABNORMAL HIGH (ref 0.0–0.5)
Eosinophils Relative: 18 %
Eosinophils Relative: 12 %
HCT: 27.1 % — ABNORMAL LOW (ref 36.0–46.0)
HCT: 30.6 % — ABNORMAL LOW (ref 36.0–46.0)
Hemoglobin: 9.1 g/dL — ABNORMAL LOW (ref 12.0–15.0)
Hemoglobin: 10.5 g/dL — ABNORMAL LOW (ref 12.0–15.0)
Immature Granulocytes: 4 %
Lymphocytes Relative: 18 %
Lymphocytes Relative: 6 %
Lymphs Abs: 1.8 10*3/uL (ref 0.7–4.0)
Lymphs Abs: 0.6 10*3/uL — ABNORMAL LOW (ref 0.7–4.0)
MCH: 25.1 pg — ABNORMAL LOW (ref 26.0–34.0)
MCH: 25.5 pg — ABNORMAL LOW (ref 26.0–34.0)
MCHC: 33.6 g/dL (ref 30.0–36.0)
MCHC: 34.3 g/dL (ref 30.0–36.0)
MCV: 74.9 fL — ABNORMAL LOW (ref 80.0–100.0)
MCV: 74.5 fL — ABNORMAL LOW (ref 80.0–100.0)
Monocytes Absolute: 1.3 10*3/uL — ABNORMAL HIGH (ref 0.1–1.0)
Monocytes Absolute: 0.3 10*3/uL (ref 0.1–1.0)
Monocytes Relative: 13 %
Monocytes Relative: 3 %
Myelocytes: 2 %
Neutro Abs: 4.5 10*3/uL (ref 1.7–7.7)
Neutro Abs: 8.1 10*3/uL — ABNORMAL HIGH (ref 1.7–7.7)
Neutrophils Relative %: 46 %
Neutrophils Relative %: 74 %
Platelets: 97 10*3/uL — ABNORMAL LOW (ref 150–400)
Platelets: 99 10*3/uL — ABNORMAL LOW (ref 150–400)
RBC: 3.62 MIL/uL — ABNORMAL LOW (ref 3.87–5.11)
RBC: 4.11 MIL/uL (ref 3.87–5.11)
RDW: 19.9 % — ABNORMAL HIGH (ref 11.5–15.5)
RDW: 20 % — ABNORMAL HIGH (ref 11.5–15.5)
Smear Review: NORMAL
Smear Review: NORMAL
WBC: 9.9 10*3/uL (ref 4.0–10.5)
WBC: 10.5 10*3/uL (ref 4.0–10.5)
nRBC: 0 % (ref 0.0–0.2)
nRBC: 0 % (ref 0.0–0.2)

## 2023-04-18 LAB — COMPREHENSIVE METABOLIC PANEL WITH GFR
ALT: 23 U/L (ref 0–44)
AST: 48 U/L — ABNORMAL HIGH (ref 15–41)
Albumin: 2.5 g/dL — ABNORMAL LOW (ref 3.5–5.0)
Alkaline Phosphatase: 98 U/L (ref 38–126)
Anion gap: 7 (ref 5–15)
BUN: 12 mg/dL (ref 6–20)
CO2: 25 mmol/L (ref 22–32)
Calcium: 7.7 mg/dL — ABNORMAL LOW (ref 8.9–10.3)
Chloride: 105 mmol/L (ref 98–111)
Creatinine, Ser: 0.57 mg/dL (ref 0.44–1.00)
GFR, Estimated: >60 mL/min (ref >60)
Glucose, Bld: 86 mg/dL (ref 70–99)
Potassium: 3.1 mmol/L — ABNORMAL LOW (ref 3.5–5.1)
Sodium: 137 mmol/L (ref 135–145)
Total Bilirubin: 0.6 mg/dL (ref 0.0–1.2)
Total Protein: 5.6 g/dL — ABNORMAL LOW (ref 6.5–8.1)

## 2023-04-18 LAB — URINE CULTURE: Culture: 60000 — AB

## 2023-04-18 LAB — DIC (DISSEMINATED INTRAVASCULAR COAGULATION)PANEL
D-Dimer, Quant: 3.7 ug{FEU}/mL — ABNORMAL HIGH (ref 0.00–0.50)
Fibrinogen: 65 mg/dL — CL (ref 210–475)
INR: 1.6 — ABNORMAL HIGH (ref 0.8–1.2)
Platelets: 98 10*3/uL — ABNORMAL LOW (ref 150–400)
Prothrombin Time: 19.6 s — ABNORMAL HIGH (ref 11.4–15.2)
aPTT: 34 s (ref 24–36)

## 2023-04-18 LAB — PREPARE CRYOPRECIPITATE: Unit division: 0

## 2023-04-18 LAB — MAGNESIUM: Magnesium: 1.9 mg/dL (ref 1.7–2.4)

## 2023-04-18 LAB — PHOSPHORUS: Phosphorus: 1.9 mg/dL — ABNORMAL LOW (ref 2.5–4.6)

## 2023-04-18 LAB — C-REACTIVE PROTEIN: CRP: 3.1 mg/dL — ABNORMAL HIGH (ref <1.0)

## 2023-04-18 MED ORDER — HYDROXYZINE HCL 25 MG PO TABS: 25.0000 mg | ORAL_TABLET | Freq: Three times a day (TID) | ORAL | Status: AC | PRN

## 2023-04-18 MED ORDER — PANTOPRAZOLE SODIUM 40 MG IV SOLR: 40.0000 mg | INTRAVENOUS | Status: AC

## 2023-04-18 MED ORDER — ACETAMINOPHEN 325 MG PO TABS
650.0000 mg | ORAL_TABLET | Freq: Once | ORAL | Status: AC
  Administered 2023-04-18: 650 mg via ORAL
  Filled 2023-04-18: qty 2

## 2023-04-18 MED ORDER — HYDROXYZINE HCL 25 MG PO TABS
25.0000 mg | ORAL_TABLET | Freq: Three times a day (TID) | ORAL | Status: DC | PRN
  Administered 2023-04-19: 25 mg via ORAL
  Filled 2023-04-18: qty 1

## 2023-04-18 MED ORDER — MELATONIN 3 MG PO TABS: 3.0000 mg | ORAL_TABLET | Freq: Every day | ORAL | Status: AC

## 2023-04-18 MED ORDER — ENSURE ENLIVE PO LIQD
237.0000 mL | Freq: Two times a day (BID) | ORAL | Status: DC
  Administered 2023-04-19 – 2023-04-20 (×3): 237 mL via ORAL

## 2023-04-18 MED ORDER — SODIUM CHLORIDE 0.9 % IV SOLN
1000.0000 mg | Freq: Every day | INTRAVENOUS | Status: AC
  Administered 2023-04-19 – 2023-04-20 (×3): 1000 mg via INTRAVENOUS
  Filled 2023-04-18 (×3): qty 16

## 2023-04-18 MED ORDER — NIRMATRELVIR/RITONAVIR (PAXLOVID)TABLET: 3.0000 | ORAL_TABLET | Freq: Two times a day (BID) | ORAL | Status: AC

## 2023-04-18 MED ORDER — SODIUM CHLORIDE 0.9 % IV SOLN: 1000.0000 mg | Freq: Every day | INTRAVENOUS | Status: AC

## 2023-04-18 MED ORDER — POTASSIUM CHLORIDE CRYS ER 10 MEQ PO TBCR
40.0000 meq | EXTENDED_RELEASE_TABLET | ORAL | Status: AC
  Administered 2023-04-18 (×2): 40 meq via ORAL
  Filled 2023-04-18 (×2): qty 4

## 2023-04-18 MED ORDER — FUROSEMIDE 10 MG/ML IJ SOLN
40.0000 mg | Freq: Two times a day (BID) | INTRAMUSCULAR | Status: DC
  Administered 2023-04-18 – 2023-04-19 (×4): 40 mg via INTRAVENOUS
  Filled 2023-04-18 (×4): qty 4

## 2023-04-18 MED ORDER — SODIUM CHLORIDE 0.9% IV SOLUTION: Freq: Once | INTRAVENOUS | Status: AC

## 2023-04-18 MED ORDER — KETOROLAC TROMETHAMINE 30 MG/ML IJ SOLN: 30.0000 mg | Freq: Four times a day (QID) | INTRAMUSCULAR | Status: AC | PRN

## 2023-04-18 MED ORDER — POTASSIUM PHOSPHATES 15 MMOLE/5ML IV SOLN
30.0000 mmol | Freq: Once | INTRAVENOUS | Status: AC
  Administered 2023-04-18: 30 mmol via INTRAVENOUS
  Filled 2023-04-18: qty 10

## 2023-04-18 MED ORDER — CAMPHOR-MENTHOL 0.5-0.5 % EX LOTN
TOPICAL_LOTION | CUTANEOUS | Status: DC | PRN
  Administered 2023-04-18: 1 via TOPICAL
  Filled 2023-04-18: qty 222

## 2023-04-18 NOTE — Progress Notes (Signed)
 Physical Therapy Treatment Patient Details Name: Melissa Little MRN: [id number] DOB: [birth year] Today's Date: 04/18/2023   History of Present Illness 38 y.o. female presents to [facility] with 3 days of body aches, cough, N/V, sore throat, and weakness. Pt with Covid + diagnosis and SIRS. PMHx: still disease, RA, anxiety    PT Comments  Pt resting in bed on arrival and agreeable to session with continued progress towards acute goals. Pt demonstrating increased activity tolerance, progressing gait distance with intermittent IV pole support for stability and grossly supervision for safety.  Encouraged frequent mobilization, as well as IS use with pt verbalizing understanding and demonstrating proper IS use pulling up to . Pt continues to benefit from skilled PT services to progress toward functional mobility goals.      If plan is discharge home, recommend the following: A little help with walking and/or transfers;Assist for transportation;Help with stairs or ramp for entrance   Can travel by private vehicle        Equipment Recommendations  Other (comment) (TBD)    Recommendations for Other Services       Precautions / Restrictions Precautions Precautions: Fall Recall of Precautions/Restrictions: Intact Precaution/Restrictions Comments: airborne precautions Restrictions Weight Bearing Restrictions Per Provider Order: No     Mobility  Bed Mobility Overal bed mobility: Modified Independent                  Transfers Overall transfer level: Needs assistance Equipment used: None Transfers: Sit to/from Stand Sit to Stand: Supervision           General transfer comment: supervision to stand, reaches for UE support when available    Ambulation/Gait Ambulation/Gait assistance: Supervision Gait Distance (Feet): 200 Feet Assistive device: IV Pole, None Gait Pattern/deviations: Step-through pattern, Decreased step length - right, Decreased step length - left Gait  velocity: decr     General Gait Details: slow and steady gait, prefers IV pole for support. However, pt is able to ambulate with no AD with no LOB   Stairs             Wheelchair Mobility     Tilt Bed    Modified Rankin (Stroke Patients Only)       Balance Overall balance assessment: Needs assistance, Mild deficits observed, not formally tested Sitting-balance support: No upper extremity supported, Feet supported Sitting balance-Leahy Scale: Good     Standing balance support: No upper extremity supported Standing balance-Leahy Scale: Fair                              Hotel manager: No apparent difficulties  Cognition Arousal: Alert Behavior During Therapy: WFL for tasks assessed/performed   PT - Cognitive impairments: No apparent impairments                         Following commands: Intact      Cueing Cueing Techniques: Verbal cues  Exercises Other Exercises Other Exercises: Incentive spirometer, x8, able to pull 1650    General Comments General comments (skin integrity, edema, etc.): VSS on RA      Pertinent Vitals/Pain Pain Assessment Pain Assessment: No/denies pain    Home Living                          Prior Function            PT Goals (current goals can  now be found in the care plan section) Acute Rehab PT Goals Patient Stated Goal: to get stronger PT Goal Formulation: With patient Time For Goal Achievement: 04/29/23 Progress towards PT goals: Progressing toward goals    Frequency    Min 2X/week      PT Plan      Co-evaluation              AM-PAC PT "6 Clicks" Mobility   Outcome Measure  Help needed turning from your back to your side while in a flat bed without using bedrails?: None Help needed moving from lying on your back to sitting on the side of a flat bed without using bedrails?: None Help needed moving to and from a bed to a chair (including a  wheelchair)?: A Little Help needed standing up from a chair using your arms (e.g., wheelchair or bedside chair)?: A Little Help needed to walk in hospital room?: A Little Help needed climbing 3-5 steps with a railing? : A Little 6 Click Score: 20    End of Session   Activity Tolerance: Patient tolerated treatment well Patient left: with call bell/phone within reach;in bed Nurse Communication: Mobility status PT Visit Diagnosis: Unsteadiness on feet (R26.81);Other abnormalities of gait and mobility (R26.89)     Time: 0272-5366 PT Time Calculation (min) (ACUTE ONLY): 12 min  Charges:    $Therapeutic Exercise: 8-22 mins PT General Charges $$ ACUTE PT VISIT: 1 Visit                     Brittnye Josephs R. PTA Acute Rehabilitation Services Office: [phone]   Catalina Antigua 04/18/2023, 11:38 AM

## 2023-04-18 NOTE — Progress Notes (Addendum)
 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 Had increased pred for adrenal insufficiency  Afebrile Had stopped abx a few days ago   -no other id w/u needed at this time -supportive care for covid -maintain airborne/droplet isolation precaution for covid -agree with further evaluation at tertiary center where rheumatology service is available -discussed with dr Janee Morn

## 2023-04-18 NOTE — Progress Notes (Signed)
 Hematology Short Note  Component     Latest Ref Rng 04/16/2023 04/17/2023  WBC     4.0 - 10.5 K/uL  13.1 (H)   RBC     3.87 - 5.11 MIL/uL  4.86   Hemoglobin     12.0 - 15.0 g/dL  16.1   HCT     09.6 - 46.0 %  35.7 (L)   MCV     80.0 - 100.0 fL  73.5 (L)   MCH     26.0 - 34.0 pg  25.1 (L)   MCHC     30.0 - 36.0 g/dL  04.5   RDW     40.9 - 15.5 %  20.5 (H)   Platelets     150 - 400 K/uL 97 (L)  104 (L)   Platelets       91 (L)   nRBC     0.0 - 0.2 %  0.0   Neutrophils     %  63   NEUT#     1.7 - 7.7 K/uL  8.2 (H)   Lymphocytes     %  16   Lymphs Abs     0.7 - 4.0 K/uL  2.1   Monocytes Relative     %  9   Monocyte #     0.1 - 1.0 K/uL  1.2 (H)   Eosinophil     %  2   Eosinophils Absolute     0.0 - 0.5 K/uL  0.3   Basophil     %  2   Basophils Absolute     0.0 - 0.1 K/uL  0.2 (H)   Immature Granulocytes     %  8   Abs Immature Granulocytes     0.00 - 0.07 K/uL  1.07 (H)   Sodium     135 - 145 mmol/L 131 (L)    Potassium     3.5 - 5.1 mmol/L 3.6    Chloride     98 - 111 mmol/L 105    CO2     22 - 32 mmol/L 17 (L)    Glucose     70 - 99 mg/dL 89    BUN     6 - 20 mg/dL 11    Creatinine     8.11 - 1.00 mg/dL 9.14    Calcium     8.9 - 10.3 mg/dL 7.1 (L)    Total Protein     6.5 - 8.1 g/dL 4.7 (L)    Albumin     3.5 - 5.0 g/dL <7.8 (L)    AST     15 - 41 U/L 80 (H)    ALT     0 - 44 U/L 27    Alkaline Phosphatase     38 - 126 U/L 131 (H)    Total Bilirubin     0.0 - 1.2 mg/dL 0.6    GFR, Estimated     >60 mL/min >60    Anion gap     5 - 15  9    Prothrombin Time     11.4 - 15.2 seconds 21.2 (H)  21.2 (H)   INR     0.8 - 1.2  1.8 (H)  1.8 (H)   APTT     24 - 36 seconds 42 (H)  36   Fibrinogen     210 - 475 mg/dL 61 (LL)  <29 (LL)   D-Dimer, Quant  0.00 - 0.50 ug/mL-FEU 3.67 (H)  7.34 (H)   Smear Review NO SCHISTOCYTES SEEN  NO SCHISTOCYTES SEEN   LDH     98 - 192 U/L  1,375 (H)   Ferritin     11 - 307 ng/mL  >7,500 (H)    Triglycerides     <150 mg/dL  409 (H)     Legend: (L) Low (H) High (LL) Low Panic    Assessment  Reviewed patient's labs. 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 She had been on decreasing dose of prednisone and has been on a slightly higher dose of prednisone at 40 mg p.o. daily instead of her baseline of 10 mg p.o. daily. She has been on maintenance anakinra at 100 mg p.o. daily and was previously on 100 mg twice daily.  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 -This is a complicated case and would be best managed by her primary rheumatologist. -Would recommend reaching out to the patient's primary rheumatologist to consider transfer to [facility]. -Hematology/oncology would not typically be able to handle management of this in the context of her known rheumatologic disease. -Generally patient will need much higher doses of steroids roughly the equivalent of 1000 mg of Solu-Medrol daily for 3 days followed by going back to a high dose oral prednisone taper with considerations of wrapping however anakinra, consideration of IVIG and significant support in terms of management of her coagulopathy liver dysfunction and any other organ support.  In refractory cases even etoposide like regimens have to be considered. -Recommend transfer to [facility] for further management.  Wyvonnia Lora MD

## 2023-04-18 NOTE — Discharge Summary (Signed)
 Physician Discharge Summary  Melissa Brumbaugh ZOX:[id number] DOB: June 24, 1985 DOA: 04/14/2023  PCP: [facility]  Admit date: 04/14/2023 Discharge date: 04/18/2023  Time spent: 60 minutes  Recommendations for Outpatient Follow-up:  Patient will be transferred to tertiary care center at [facility].   Discharge Diagnoses:  Principal Problem:   COVID-19 Active Problems:   SIRS (systemic inflammatory response syndrome) (HCC)   MAS (macrophage activation syndrome) (HCC)   Anxiety   Adult-onset Still's disease (HCC)   Dehydration   Hyponatremia   Hypokalemia   UTI (urinary tract infection)   Hypomagnesemia   Hypophosphatemia   Lactic acidosis   Leukocytosis   Acute encephalopathy   Anasarca   Hypofibrinogenemia (HCC)   Elevated LDH   Discharge Condition: Stable  Diet recommendation: Regular  Filed Weights   04/14/23 1619 04/14/23 2050 04/16/23 0303  Weight: 54.5 kg 55.3 kg 58.6 kg    History of present illness:  Melissa Little is a 38 y.o. female with medical history significant of adult stills disease, rheumatoid arthritis ( on chronic steroids and anakinra) presenting to the ED with a 3-day history of diffuse bodyaches/myalgias, nonproductive cough, nausea and emesis, sore throat, decreased oral intake, palpitations, and generalized weakness.  Patient does endorse some chills.  Patient denies any fevers, no chest pain, no abdominal pain, no diarrhea, no constipation, no melena, no hematemesis, no hematochezia, no syncopal episodes, no significant lightheadedness, no hemoptysis, no other associated symptoms.   ED Course: Patient seen in the ED noted to initially have elevated lactic acid of 4.3 which went down to 2.0 after some fluid resuscitation.  SARS coronavirus 2 PCR positive, influenza A and B PCR negative, RSV by PCR negative.  Comprehensive metabolic profile with a sodium of 127, potassium of 3.2, chloride of 95, bicarb of 21, calcium of 6.9, alk  phosphatase 128, albumin of 1.6, AST of 109, total protein of 5.5 otherwise within normal limits.  CBC with a white count of 22 with a left shift with hemoglobin of 13, platelet count of 84.  Urinalysis pending.  Blood cultures ordered and pending.  Patient noted on presentation with tachycardia and heart rates in the 120s on presentation, tachypnea with respiratory rate of 40.    Hospital Course:  #1 COVID-19 infection -Patient presented with 3-day history of myalgias, chills, shortness of breath, nonproductive cough, sore throat, decreased oral intake. -COVID-19 PCR positive.   Influenza A and B PCR negative.    RSV by PCR negative. -CRP elevated and was trending up but trending back down to 6.6 from 11.8 from 10 from 9.2.  -Patient remained afebrile, patient with sats of 95-100% on room air. -Patient with history of stills disease on chronic steroids and anakinra and as such patient started on Paxlovid during the hospitalization.  -Patient also placed on stress dose steroids as patient noted to be chronically on oral prednisone 10 mg daily.  -Patient improved clinically.  -Due to concern for macrophage activation syndrome patient will be transferred to tertiary care center at [facility].     2.  SIRS/leukocytosis -Patient on presentation met criteria for SIRS as patient noted to be tachycardic, tachypneic, have a significant leukocytosis and the elevated lactic acid level. -Likely secondary to problem #1. -Chest x-ray done with no acute infiltrate. -Blood cultures ordered and pending with no growth to date x 4 days.. -UA with concerns for possible UTI.   -Urine cultures with 60,000 colonies of E. coli and > 100,000 colonies of Streptococcus aginosis.   -  Lactic acidosis resolved with hydration.   -Due to history of stills disease, on chronic steroid and DMARD patient placed empirically on IV vancomycin, IV cefepime.   -Patient with a worsening leukocytosis and as such seen in consultation by ID  who recommended CT chest abdomen and pelvis which was done.   -ID also recommended discontinuation of antibiotics.   -Leukocytosis trended down as well as eosinophilia.  -Patient remained afebrile. -ID subsequently signed off. -Patient will be transferred to tertiary care center at [facility].   3.  Macrophage activation syndrome/HLH -Patient noted with a elevated LDH of 1375, fibrinogen decreased at less than 60, no schistocytes noted, patient with thrombocytopenia. -Patient noted with no overt bleeding. -Patient seen in consultation by hematology who ordered a DIC panel. 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 -Patient noted to have severely elevated ferritin levels of > 7500, triglycerides of 692, hypofibrinogenemia which was severe without significant schistocytes to suggest DIC in the setting of her underlying stills disease suggested MAS/HLH that was not optimally controlled on current treatment during the hospitalization. -Hematology for outpatient with a high likelihood that patient has relapse/progression of her HLH/MAS from his stills disease and potential additional inflammatory insults from a COVID-19 infection. -It was felt patient's case was complicated and would best be managed by her primary rheumatologist and recommended transfer to tertiary care center. -Patient received cryoprecipitate x 2 to keep fibrinogen levels at approximately 100 to decrease the risk of spontaneous bleeding per hematology recommendations. -Patient's prednisone dose was changed to Solu-Medrol 1000 mg IV daily while awaiting transfer to tertiary care center at [facility]. -Patient will be discharged to tertiary care center at [facility].   4.  Lactic acidosis -Likely secondary to dehydration. -Resolved with hydration.    5.  Dehydration -Patient hydrated with IV fluids.     6.   Hypokalemia/hypophosphatemia/hypomagnesemia -Secondary to GI losses. -On day of discharge potassium noted at 3.1 which was repleted, phosphorus at 1.9 and patient received K-Phos 30 mmol IV x 1, magnesium noted at 1.9.   -Patient be discharged to tertiary care center at [facility].   -Further management at tertiary care center.   7.  Hyponatremia -Likely secondary to hypovolemic hyponatremia. -Urine sodium of 25, urine creatinine 111, urine osmolality 406.   -Serum osmolality 276.   -Improved with hydration.   8.  History of stills disease/rheumatoid arthritis -Due to concern for acute infection home regimen prednisone increased to 20 mg daily on admission and subsequently up to 40 mg daily. -Due to concerns for macrophage activation syndrome/HLH hematology recommended increasing steroids to Solu-Medrol 1000 mg IV daily x 3 days while awaiting transfer to tertiary care center at [facility]. -Patient was maintained on home regimen anakinra. -Patient will be transferred to [facility] for further management.    9.  Thrombocytopenia -Likely secondary macrophage activation syndrome/HLH versus secondary to acute infection. -Patient with no overt bleeding. -Platelet count noted at 99K on day of discharge.   10. microcytic anemia/folate deficiency -Likely iron deficiency anemia.  Also likely initially felt partly dilutional. -Patient also noted with concerns of macrophage activation syndrome/HLH per hematology as noted above. -Patient with no overt bleeding. -Anemia panel with iron level of 38, folate of 3.4, ferritin > 7500. -Patient placed on folate 1 mg daily.  -Hemoglobin noted at 10.5 on day of discharge.  -Outpatient follow-up.    11.  E. coli UTI -Urine  cultures with 60,000 colonies of E. coli and > 100,000 colonies of Streptococcus anginosus.   -Patient presented with SIRS picture, immunocompromised on chronic steroids.  -Status post 3 days IV cefepime.   12.  Anasarca--per CT abdomen and  pelvis -Patient with noted anasarca with interstitial pulmonary edema, trace ascites, trace bilateral pleural effusions and diffuse body wall edema on CT abdomen and pelvis. -Likely secondary to hypoalbuminemia as patient with a albumin level < 1.5. -Placed on Lasix 40 mg IV every 12 hours and also placed on IV albumin with good urine output.   -Patient also placed on Ensure supplementation.    13.??  Colitis -CT abdomen and pelvis after differential bowel wall thickening involving the cecum and mild pericecal inflammatory stranding which may reflect changes of an infectious or inflammatory colitis. -Patient denied any diarrhea, denies any abdominal pain. -Status post 3 days IV cefepime. -Discussed with ID and no further antibiotic treatment needed.   14.  Acute metabolic encephalopathy -Patient noted to have a bout of encephalopathy the evening of 04/16/2023 when she was not communicating however followed commands and nodding head yes or no. -Given a dose of Ativan on 04/16/2023 with significant improvement with mental status and was back to baseline. -Patient with no focal neurological deficits.    Procedures: Chest x-ray 04/14/2023 CT chest/abdomen/pelvis 04/16/2023 Cryoprecipitate x 2  Consultations: ID: Dr. Renold Don 04/16/2023 Hematology/oncology: Dr.Kale 04/17/2023  Discharge Exam: Vitals:   04/18/23 1257 04/18/23 1415  BP: (!) 138/101 (!) 139/110  Pulse: 75 82  Resp: (!) 22 13  Temp: 98.6 F (37 C) 97.9 F (36.6 C)  SpO2: 100% 95%    General: NAD Cardiovascular: RRR no murmurs rubs or gallops.  No JVD.  No pitting lower extremity edema. Respiratory: Clear to auscultation bilaterally.  No wheezes, no crackles, no rhonchi.  Fair air movement.  Speaking in full sentences.  Discharge Instructions   Discharge Instructions     Diet general   Complete by: As directed    Increase activity slowly   Complete by: As directed       Allergies as of 04/18/2023   No Known  Allergies      Medication List     PAUSE taking these medications    predniSONE 10 MG tablet Wait to take this until: April 25, 2023 Commonly known as: DELTASONE Take 1 tablet (10 mg total) by mouth daily with breakfast.       TAKE these medications    anakinra 100 MG/0.67ML Sosy injection Commonly known as: KINERET Inject 100 mg into the vein daily.   folic acid 400 MCG tablet Commonly known as: FOLVITE Take 400 mcg by mouth daily.   hydrOXYzine 25 MG tablet Commonly known as: ATARAX Take 1 tablet (25 mg total) by mouth 3 (three) times daily as needed for anxiety.   ibuprofen 600 MG tablet Commonly known as: ADVIL Take 1 tablet (600 mg total) by mouth every 6 (six) hours.   ketorolac 30 MG/ML injection Commonly known as: TORADOL Inject 1 mL (30 mg total) into the vein every 6 (six) hours as needed (severe pain).   melatonin 3 MG Tabs tablet Take 1 tablet (3 mg total) by mouth at bedtime.   methylPREDNISolone sodium succinate 1,000 mg in sodium chloride 0.9 % 50 mL Inject 1,000 mg into the vein daily.   nirmatrelvir/ritonavir 20 x 150 MG & 10 x 100MG  Tabs Commonly known as: PAXLOVID Take 3 tablets by mouth 2 (two) times daily for 5 days. Patient  GFR is > 60. Take nirmatrelvir (150 mg) two tablets twice daily for 5 days and ritonavir (100 mg) one tablet twice daily for 5 days.   oxyCODONE 5 MG immediate release tablet Commonly known as: Oxy IR/ROXICODONE Take 1 tablet (5 mg total) by mouth every 6 (six) hours as needed for severe pain (pain score 7-10) or breakthrough pain.   pantoprazole 40 MG injection Commonly known as: PROTONIX Inject 40 mg into the vein daily.   Senna-S 8.6-50 MG tablet Generic drug: senna-docusate Take 2 tablets by mouth daily.       No Known Allergies  Follow-up Information     MD at [facility] Follow up.                   The results of significant diagnostics from this hospitalization (including imaging, microbiology,  ancillary and laboratory) are listed below for reference.    Significant Diagnostic Studies: CT CHEST ABDOMEN PELVIS W CONTRAST Result Date: 04/16/2023 CLINICAL DATA:  Progressive leukocytosis, nausea, dyspnea EXAM: CT CHEST, ABDOMEN, AND PELVIS WITH CONTRAST TECHNIQUE: Multidetector CT imaging of the chest, abdomen and pelvis was performed following the standard protocol during bolus administration of intravenous contrast. RADIATION DOSE REDUCTION: This exam was performed according to the departmental dose-optimization program which includes automated exposure control, adjustment of the mA and/or kV according to patient size and/or use of iterative reconstruction technique. CONTRAST:  75mL OMNIPAQUE IOHEXOL 350 MG/ML SOLN COMPARISON:  None Available. FINDINGS: CT CHEST FINDINGS Cardiovascular: No significant vascular findings. Normal heart size. No pericardial effusion. Mediastinum/Nodes: There is shotty bilateral subpectoral and axillary lymphadenopathy which is normal in size but abnormal in number. This is nonspecific and may be reactive or inflammatory in nature as can be seen with systemic illness, sarcoidosis, or connective tissue disorders. This appears similar to MRI examination of 09/30/2022. Visualized thyroid is unremarkable. Esophagus is unremarkable. Lungs/Pleura: Trace interstitial pulmonary edema. Trace bilateral pleural effusions. No pneumothorax. No confluent pulmonary infiltrate. No central obstructing lesion. Musculoskeletal: No chest wall mass or suspicious bone lesions identified. CT ABDOMEN PELVIS FINDINGS Hepatobiliary: Moderate hepatomegaly with liver measuring 22.9 cm in craniocaudal dimension. No enhancing intrahepatic mass. Moderate hepatic steatosis. Gallbladder unremarkable. No intra or extrahepatic biliary ductal dilation. Pancreas: Unremarkable. No pancreatic ductal dilatation or surrounding inflammatory changes. Spleen: Normal in size without focal abnormality. Adrenals/Urinary  Tract: 17 mm nodule is seen within the left adrenal gland previously characterized as a benign adrenal adenoma on MRI examination of 09/12 4. No follow-up imaging is recommended. Right adrenal gland is unremarkable. Kidneys are unremarkable. Bladder is unremarkable. Stomach/Bowel: There is circumferential bowel wall thickening involving the cecum and mild peri-cecal inflammatory stranding which may reflect changes of an infectious or inflammatory colitis. In the immunocompromised patient, typhlitis should be considered. The stomach, small, large bowel are otherwise. No evidence of obstruction. Appendix normal. No free intraperitoneal gas. Trace ascites. Vascular/Lymphatic: No significant vascular findings are present. No enlarged abdominal or pelvic lymph nodes. Reproductive: Uterus and bilateral adnexa are unremarkable. Other: Moderate diffuse subcutaneous body wall edema. Musculoskeletal: No acute bone abnormality. No lytic or blastic bone lesion. IMPRESSION: 1. Circumferential bowel wall thickening involving the cecum and mild peri-cecal inflammatory stranding which may reflect changes of an infectious or inflammatory colitis. In the immunocompromised patient, typhlitis should be considered. 2. Anasarca with interstitial pulmonary edema, trace ascites, trace bilateral pleural effusions, and diffuse body wall edema. 3. Moderate hepatomegaly and hepatic steatosis. 4. 17 mm left adrenal adenoma. 5. Shotty bilateral subpectoral and axillary lymphadenopathy  which is normal in size but abnormal in number. This is nonspecific and may be reactive or inflammatory in nature as can be seen with systemic illness, sarcoidosis, or connective tissue disorders. This appears similar to MRI examination of 09/30/2022. Electronically Signed   By: Helyn Numbers M.D.   On: 04/16/2023 23:07   DG CHEST PORT 1 VIEW Result Date: 04/16/2023 CLINICAL DATA:  Leukocytosis. EXAM: PORTABLE CHEST 1 VIEW COMPARISON:  Chest x-ray dated April 14, 2023. FINDINGS: The heart size and mediastinal contours are within normal limits. Both lungs are clear. The visualized skeletal structures are unremarkable. IMPRESSION: No active disease. Electronically Signed   By: Obie Dredge M.D.   On: 04/16/2023 12:58   DG Chest 2 View Result Date: 04/14/2023 CLINICAL DATA:  cough, rhonchi, possible flu/PNA. EXAM: CHEST - 2 VIEW COMPARISON:  09/28/2022. FINDINGS: Bilateral lung fields are clear. Bilateral costophrenic angles are clear. Normal cardio-mediastinal silhouette. No acute osseous abnormalities. The soft tissues are within normal limits. IMPRESSION: No active cardiopulmonary disease. Electronically Signed   By: Jules Schick M.D.   On: 04/14/2023 13:38    Microbiology: Recent Results (from the past 240 hours)  Resp panel by RT-PCR (RSV, Flu A&B, Covid) Anterior Nasal Swab     Status: Abnormal   Collection Time: 04/14/23 12:36 PM   Specimen: Anterior Nasal Swab  Result Value Ref Range Status   SARS Coronavirus 2 by RT PCR POSITIVE (A) NEGATIVE Final   Influenza A by PCR NEGATIVE NEGATIVE Final   Influenza B by PCR NEGATIVE NEGATIVE Final    Comment: (NOTE) The Xpert Xpress SARS-CoV-2/FLU/RSV plus assay is intended as an aid in the diagnosis of influenza from Nasopharyngeal swab specimens and should not be used as a sole basis for treatment. Nasal washings and aspirates are unacceptable for Xpert Xpress SARS-CoV-2/FLU/RSV testing.  Fact Sheet for Patients: BloggerCourse.com  Fact Sheet for Healthcare Providers: SeriousBroker.it  This test is not yet approved or cleared by the [place] FDA and has been authorized for detection and/or diagnosis of SARS-CoV-2 by FDA under an Emergency Use Authorization (EUA). This EUA will remain in effect (meaning this test can be used) for the duration of the COVID-19 declaration under Section 564(b)(1) of the Act, 21 U.S.C. section  360bbb-3(b)(1), unless the authorization is terminated or revoked.     Resp Syncytial Virus by PCR NEGATIVE NEGATIVE Final    Comment: (NOTE) Fact Sheet for Patients: BloggerCourse.com  Fact Sheet for Healthcare Providers: SeriousBroker.it  This test is not yet approved or cleared by the [place] FDA and has been authorized for detection and/or diagnosis of SARS-CoV-2 by FDA under an Emergency Use Authorization (EUA). This EUA will remain in effect (meaning this test can be used) for the duration of the COVID-19 declaration under Section 564(b)(1) of the Act, 21 U.S.C. section 360bbb-3(b)(1), unless the authorization is terminated or revoked.  Performed at [facility], [address] [place], [place] [address]   Culture, blood (routine x 2)     Status: None (Preliminary result)   Collection Time: 04/14/23 12:37 PM   Specimen: BLOOD RIGHT FOREARM  Result Value Ref Range Status   Specimen Description BLOOD RIGHT FOREARM  Final   Special Requests   Final    BOTTLES DRAWN AEROBIC AND ANAEROBIC Blood Culture adequate volume   Culture   Final    NO GROWTH 4 DAYS Performed at [facility], [address] [place], [place] [address]    Report Status  PENDING  Incomplete  Culture, blood (routine x 2)     Status: None (Preliminary result)   Collection Time: 04/14/23 12:42 PM   Specimen: BLOOD  Result Value Ref Range Status   Specimen Description BLOOD RIGHT ANTECUBITAL  Final   Special Requests   Final    BOTTLES DRAWN AEROBIC AND ANAEROBIC Blood Culture adequate volume   Culture   Final    NO GROWTH 4 DAYS Performed at [facility], [address], [place], [place] [address]    Report Status PENDING  Incomplete  Urine Culture     Status: Abnormal   Collection Time: 04/14/23  4:10 PM   Specimen: Urine, Clean Catch  Result Value Ref Range Status   Specimen Description URINE, CLEAN CATCH  Final   Special  Requests   Final    Immunocompromised Performed at [facility], [address] [place], [place] [address]    Culture (A)  Final    60,000 COLONIES/mL ESCHERICHIA COLI >=100,000 COLONIES/mL STREPTOCOCCUS ANGINOSIS    Report Status 04/18/2023 FINAL  Final   Organism ID, Bacteria ESCHERICHIA COLI (A)  Final   Organism ID, Bacteria STREPTOCOCCUS ANGINOSIS (A)  Final      Susceptibility   Escherichia coli - MIC*    AMPICILLIN >=32 RESISTANT Resistant     CEFAZOLIN <=4 SENSITIVE Sensitive     CEFEPIME <=0.12 SENSITIVE Sensitive     CEFTRIAXONE <=0.25 SENSITIVE Sensitive     CIPROFLOXACIN <=0.25 SENSITIVE Sensitive     GENTAMICIN <=1 SENSITIVE Sensitive     IMIPENEM <=0.25 SENSITIVE Sensitive     NITROFURANTOIN <=16 SENSITIVE Sensitive     TRIMETH/SULFA <=20 SENSITIVE Sensitive     AMPICILLIN/SULBACTAM 8 SENSITIVE Sensitive     PIP/TAZO <=4 SENSITIVE Sensitive ug/mL    * 60,000 COLONIES/mL ESCHERICHIA COLI   Streptococcus anginosis - MIC*    PENICILLIN <=0.06 SENSITIVE Sensitive     CEFTRIAXONE 0.25 SENSITIVE Sensitive     ERYTHROMYCIN <=0.12 SENSITIVE Sensitive     LEVOFLOXACIN <=0.25 SENSITIVE Sensitive     VANCOMYCIN 1 SENSITIVE Sensitive     * >=100,000 COLONIES/mL STREPTOCOCCUS ANGINOSIS     Labs: Basic Metabolic Panel: Recent Labs  Lab 04/14/23 1237 04/14/23 1722 04/15/23 0329 04/16/23 0406 04/17/23 0350 04/18/23 0830  NA 127*  --  132* 131* 133* 137  K 3.2*  --  3.6 3.6 4.4 3.1*  CL 95*  --  108 105 106 105  CO2 21*  --  20* 17* 19* 25  GLUCOSE 78  --  73 89 88 86  BUN 8  --  8 11 15 12   CREATININE 0.65  --  0.69 0.78 0.78 0.57  CALCIUM 6.9*  --  6.7* 7.1* 7.3* 7.7*  MG  --  1.4* 1.5* 2.1  --  1.9  PHOS  --   --  2.4*  --   --  1.9*   Liver Function Tests: Recent Labs  Lab 04/14/23 1237 04/15/23 0329 04/16/23 0406 04/18/23 0830  AST 109* 93* 80* 48*  ALT 25 25 27 23   ALKPHOS 128* 105 131* 98  BILITOT 0.4 0.6 0.6 0.6  PROT 5.5* 4.7* 4.7* 5.6*   ALBUMIN 1.6* <1.5* <1.5* 2.5*   No results for input(s): "LIPASE", "AMYLASE" in the last 168 hours. No results for input(s): "AMMONIA" in the last 168 hours. CBC: Recent Labs  Lab 04/15/23 0329 04/16/23 0406 04/16/23 1532 04/17/23 0350 04/17/23 1607 04/18/23 0830 04/18/23 0831 04/18/23 1042  WBC  23.2* 24.5*  --  13.1*  --  9.9  --  10.5  NEUTROABS 14.2* 17.1*  --  8.2*  --  4.5  --  8.1*  HGB 10.9* 11.3*  --  12.2  --  9.1*  --  10.5*  HCT 32.2* 33.4*  --  35.7*  --  27.1*  --  30.6*  MCV 75.2* 75.2*  --  73.5*  --  74.9*  --  74.5*  PLT 76* 95*   < > 91* 104* 97* 98* 99*   < > = values in this interval not displayed.   Cardiac Enzymes: No results for input(s): "CKTOTAL", "CKMB", "CKMBINDEX", "TROPONINI" in the last 168 hours. BNP: BNP (last 3 results) No results for input(s): "BNP" in the last 8760 hours.  ProBNP (last 3 results) No results for input(s): "PROBNP" in the last 8760 hours.  CBG: No results for input(s): "GLUCAP" in the last 168 hours.     Signed:  Ramiro Harvest MD.  [facility] 04/18/2023, 4:45 PM

## 2023-04-18 NOTE — Progress Notes (Signed)
   04/18/23 1317  TOC Brief Assessment  Insurance and Status Reviewed  Patient has primary care physician Yes  Home environment has been reviewed home alone  Prior level of function: self  Prior/Current Home Services No current home services  Social Drivers of Health Review SDOH reviewed no interventions necessary  Readmission risk has been reviewed Yes  Transition of care needs transition of care needs identified, TOC will continue to follow     Admitted with COVID, note current recommendations for HH, TOC to follow pt progression.

## 2023-04-18 NOTE — Plan of Care (Signed)

## 2023-04-18 NOTE — Progress Notes (Signed)
 Occupational Therapy Treatment and Discharge Patient Details Name: Melissa Little MRN: [id number] DOB: July 25, 1985 Today's Date: 04/18/2023   History of present illness 38 y.o. female presents to [facility] with 3 days of body aches, cough, N/V, sore throat, and weakness. Pt with Covid + diagnosis and SIRS. PMHx: still disease, RA, anxiety   OT comments  Pt is functioning independently to modified independently in ADLs and mobility. Much improved activity tolerance for evaluation. No further OT needs.       If plan is discharge home, recommend the following:  Assistance with cooking/housework;Assist for transportation   Equipment Recommendations  None recommended by OT    Recommendations for Other Services      Precautions / Restrictions Precautions Recall of Precautions/Restrictions: Intact Precaution/Restrictions Comments: airborne precautions Restrictions Weight Bearing Restrictions Per Provider Order: No       Mobility Bed Mobility Overal bed mobility: Modified Independent                  Transfers Overall transfer level: Independent Equipment used: None                     Balance   Sitting-balance support: No upper extremity supported, Feet supported Sitting balance-Leahy Scale: Normal       Standing balance-Leahy Scale: Good                             ADL either performed or assessed with clinical judgement   ADL Overall ADL's : Modified independent                                            Extremity/Trunk Assessment              Vision       Perception     Praxis     Communication Communication Communication: No apparent difficulties   Cognition Arousal: Alert Behavior During Therapy: WFL for tasks assessed/performed Cognition: No apparent impairments                               Following commands: Intact        Cueing      Exercises      Shoulder Instructions        General Comments      Pertinent Vitals/ Pain       Pain Assessment Pain Assessment: No/denies pain  Home Living                                          Prior Functioning/Environment              Frequency           Progress Toward Goals  OT Goals(current goals can now be found in the care plan section)  Progress towards OT goals: Goals met/education completed, patient discharged from OT     Plan      Co-evaluation                 AM-PAC OT "6 Clicks" Daily Activity     Outcome Measure   Help from another person eating meals?: None Help from another person taking care  of personal grooming?: None Help from another person toileting, which includes using toliet, bedpan, or urinal?: None Help from another person bathing (including washing, rinsing, drying)?: None Help from another person to put on and taking off regular upper body clothing?: None Help from another person to put on and taking off regular lower body clothing?: None 6 Click Score: 24    End of Session    OT Visit Diagnosis: Muscle weakness (generalized) (M62.81)   Activity Tolerance Patient tolerated treatment well   Patient Left in bed;with call bell/phone within reach   Nurse Communication          Time: 1478-2956 OT Time Calculation (min): 34 min  Charges: OT General Charges $OT Visit: 1 Visit OT Treatments $Self Care/Home Management : 23-37 mins  Berna Spare, OTR/L Acute Rehabilitation Services Office: [phone]   Evern Bio 04/18/2023, 3:22 PM

## 2023-04-19 DIAGNOSIS — E860 Dehydration: Secondary | ICD-10-CM | POA: Diagnosis not present

## 2023-04-19 DIAGNOSIS — R6510 Systemic inflammatory response syndrome (SIRS) of non-infectious origin without acute organ dysfunction: Secondary | ICD-10-CM | POA: Diagnosis not present

## 2023-04-19 DIAGNOSIS — U071 COVID-19: Secondary | ICD-10-CM | POA: Diagnosis not present

## 2023-04-19 LAB — BPAM CRYOPRECIPITATE
Blood Product Expiration Date: 202504042359
ISSUE DATE / TIME: 202503311219
Unit Type and Rh: 6200

## 2023-04-19 LAB — COMPREHENSIVE METABOLIC PANEL WITH GFR
ALT: 29 U/L (ref 0–44)
AST: 61 U/L — ABNORMAL HIGH (ref 15–41)
Albumin: 2.9 g/dL — ABNORMAL LOW (ref 3.5–5.0)
Alkaline Phosphatase: 125 U/L (ref 38–126)
Anion gap: 12 (ref 5–15)
BUN: 10 mg/dL (ref 6–20)
CO2: 25 mmol/L (ref 22–32)
Calcium: 8 mg/dL — ABNORMAL LOW (ref 8.9–10.3)
Chloride: 100 mmol/L (ref 98–111)
Creatinine, Ser: 0.51 mg/dL (ref 0.44–1.00)
GFR, Estimated: >60 mL/min (ref >60)
Glucose, Bld: 121 mg/dL — ABNORMAL HIGH (ref 70–99)
Potassium: 4.2 mmol/L (ref 3.5–5.1)
Sodium: 137 mmol/L (ref 135–145)
Total Bilirubin: 0.4 mg/dL (ref 0.0–1.2)
Total Protein: 5.9 g/dL — ABNORMAL LOW (ref 6.5–8.1)

## 2023-04-19 LAB — CULTURE, BLOOD (ROUTINE X 2)
Culture: NO GROWTH
Culture: NO GROWTH
Special Requests: ADEQUATE
Special Requests: ADEQUATE

## 2023-04-19 LAB — CBC WITH DIFFERENTIAL/PLATELET
Abs Immature Granulocytes: 0.19 10*3/uL — ABNORMAL HIGH (ref 0.00–0.07)
Basophils Absolute: 0 10*3/uL (ref 0.0–0.1)
Basophils Relative: 1 %
Eosinophils Absolute: 0 10*3/uL (ref 0.0–0.5)
Eosinophils Relative: 1 %
HCT: 30 % — ABNORMAL LOW (ref 36.0–46.0)
Hemoglobin: 10.1 g/dL — ABNORMAL LOW (ref 12.0–15.0)
Immature Granulocytes: 5 %
Lymphocytes Relative: 22 %
Lymphs Abs: 0.8 10*3/uL (ref 0.7–4.0)
MCH: 25.3 pg — ABNORMAL LOW (ref 26.0–34.0)
MCHC: 33.7 g/dL (ref 30.0–36.0)
MCV: 75.2 fL — ABNORMAL LOW (ref 80.0–100.0)
Monocytes Absolute: 0.2 10*3/uL (ref 0.1–1.0)
Monocytes Relative: 5 %
Neutro Abs: 2.4 10*3/uL (ref 1.7–7.7)
Neutrophils Relative %: 66 %
Platelets: 115 10*3/uL — ABNORMAL LOW (ref 150–400)
RBC: 3.99 MIL/uL (ref 3.87–5.11)
RDW: 19.6 % — ABNORMAL HIGH (ref 11.5–15.5)
WBC: 3.7 10*3/uL — ABNORMAL LOW (ref 4.0–10.5)
nRBC: 0 % (ref 0.0–0.2)

## 2023-04-19 LAB — PREPARE CRYOPRECIPITATE: Unit division: 0

## 2023-04-19 LAB — DIC (DISSEMINATED INTRAVASCULAR COAGULATION)PANEL
D-Dimer, Quant: 3.13 ug{FEU}/mL — ABNORMAL HIGH (ref 0.00–0.50)
Fibrinogen: 103 mg/dL — ABNORMAL LOW (ref 210–475)
INR: 1.3 — ABNORMAL HIGH (ref 0.8–1.2)
Platelets: 118 10*3/uL — ABNORMAL LOW (ref 150–400)
Prothrombin Time: 16.3 s — ABNORMAL HIGH (ref 11.4–15.2)
Smear Review: NONE SEEN
aPTT: 29 s (ref 24–36)

## 2023-04-19 LAB — C-REACTIVE PROTEIN: CRP: 3 mg/dL — ABNORMAL HIGH (ref <1.0)

## 2023-04-19 LAB — PHOSPHORUS: Phosphorus: 2.8 mg/dL (ref 2.5–4.6)

## 2023-04-19 MED ORDER — SODIUM CHLORIDE (PF) 0.9 % IJ SOLN
INTRAMUSCULAR | Status: AC
  Administered 2023-04-19: 10 mL
  Filled 2023-04-19: qty 10

## 2023-04-19 NOTE — Progress Notes (Signed)
 PROGRESS NOTE    Melissa Little  ZOX:[id number] DOB: December 15, 1985 DOA: 04/14/2023 PCP: [facility]    Chief Complaint  Patient presents with   Nausea   Shortness of Breath    Brief Narrative:  Patient 38 year old female history of adult stills disease, rheumatoid arthritis on chronic steroids and anakinra presented to ED with 3-day history of flulike symptoms.  Workup in the ED consistent with COVID-19 infection.  Patient also noted to have a SIRS picture and pancultured and placed empirically on IV antibiotics pending culture results.  Patient also started on Paxlovid.  Patient noted to have a significant leukocytosis, ID consulted, patient underwent CT chest abdomen and pelvis and antibiotics discontinued.  Leukocytosis improved.  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  Patient received cryoprecipitate x 2 during the hospitalization and hematology recommended transfer to tertiary care center at [facility].  Currently awaiting transfer to tertiary care center at [facility]   Assessment & Plan:   Principal Problem:   COVID-19 Active Problems:   SIRS (systemic inflammatory response syndrome) (HCC)   MAS (macrophage activation syndrome) (HCC)   Anxiety   Adult-onset Still's disease (HCC)   Dehydration   Hyponatremia   Hypokalemia   UTI (urinary tract infection)   Hypomagnesemia   Hypophosphatemia   Lactic acidosis   Leukocytosis   Acute encephalopathy   Anasarca   Hypofibrinogenemia (HCC)   Elevated LDH  #1 COVID-19 infection -Patient presented with 3-day history of myalgias, chills, shortness of breath, nonproductive cough, sore throat, decreased oral intake. -COVID-19 PCR positive.   Influenza A and B PCR negative.    RSV by PCR negative. -CRP elevated and was trending up but trending back down currently at 3.0 from 3.1 from 6.6 from 11.8 from 10 from  9.2.  -Patient afebrile, patient with sats of 98% on room air. -Patient with history of stills disease on chronic steroids and anakinra and as such patient started on Paxlovid which we will continue.   -Continue stress dose steroids as patient noted to chronically be on oral prednisone 10 mg daily prior to admission and also secondary to problem #3. -Due to concern for macrophage activation syndrome patient awaiting transfer to tertiary care center at [facility]. -Continue supportive care   2.  SIRS/leukocytosis -Patient on presentation met criteria for SIRS as patient noted to be tachycardic, tachypneic, have a significant leukocytosis and the elevated lactic acid level. -Likely secondary to problem #1. -Chest x-ray done with no acute infiltrate. -Blood cultures ordered and pending with no growth to date x 3 days.. -UA with concerns for possible UTI.   -Urine cultures with 60,000 colonies of E. Coli .and > 100,000 colonies of Streptococcus aginosis.    -Lactic acidosis resolved with hydration.   -Due to history of stills disease, on chronic steroid and DMARD patient placed empirically on IV vancomycin, IV cefepime on admission.   -Patient with a worsening leukocytosis and as such seen in consultation by ID who recommended CT chest abdomen and pelvis which was done.   -ID also recommended discontinuation of antibiotics.   -Leukocytosis trending down as well as eosinophilia.   -Patient currently afebrile.  -ID was following but has signed off. -Awaiting transfer to tertiary care center at [facility].  3.Macrophage activation syndrome/HLH -Patient noted with a elevated LDH of 1375, fibrinogen decreased at less than 60, no schistocytes noted, patient with thrombocytopenia. -Patient noted with no overt bleeding. -  Patient seen in consultation by hematology who ordered a DIC panel. -It was felt per hematology on assessment of patient and review of labs that patient likely with macrophage activation  syndrome/HLH related to his stills disease. -Patient noted to have severely elevated ferritin levels of > 7500, triglycerides of 692, hypofibrinogenemia which was severe without significant schistocytes to suggest DIC in the setting of her underlying stills disease suggested MAS/HLH that was not optimally controlled on current treatment during the hospitalization. -Hematology for outpatient with a high likelihood that patient has relapse/progression of her HLH/MAS from his stills disease and potential additional inflammatory insults from a COVID-19 infection. -It was felt patient's case was complicated and would best be managed by her primary rheumatologist and recommended transfer to tertiary care center. -Patient received cryoprecipitate x 2 to keep fibrinogen levels at approximately 100 to decrease the risk of spontaneous bleeding per hematology recommendations. -Fibrinogen level this morning at 103, D-dimer trending down currently at 3.13 from 3.70 from 7.34. -INR at 1.3. -Patient's prednisone dose was changed to Solu-Medrol 1000 mg IV daily (per hematology recommendations) while awaiting transfer to tertiary care center at [facility]. -Awaiting for transfer to tertiary care center at [facility].  4.  Lactic acidosis -Likely secondary to dehydration. -Resolved with hydration.    5.  Dehydration -Hydrated with IV fluids.  -IV fluids discontinued.   6.  Hypokalemia/hypophosphatemia/hypomagnesemia -Secondary to GI losses. -Magnesium noted at 1.9, phosphorus noted at 1.9 and patient received K-Phos 30 mmol IV x 1 on 04/18/2023.  -Awaiting transfer to tertiary care center at [facility].   7.  Hyponatremia -Likely secondary to hypovolemic hyponatremia. -Urine sodium of 25, urine creatinine 111, urine osmolality 406.   -Serum osmolality 276.   -Improved with hydration. -Patient receiving IV Lasix. -Repeat labs in the AM.   8.  History of stills disease/rheumatoid arthritis -Due to concern  for acute infection home regimen prednisone increased to 20 mg daily on admission. -Increased prednisone to 40 mg daily..  -Due to concerns for macrophage activation syndrome/HLH hematology recommended increasing steroids to Solu-Medrol 1000 mg IV daily x 3 days while awaiting transfer to tertiary care center at [facility].  -Continue home regimen anakinra. -Awaiting transfer to [facility] for further management.   9.  Thrombocytopenia -Likely secondary to acute infection. -Patient with no overt bleeding. -Slowly trending back up. -Follow.   10. microcytic anemia/folate deficiency -Likely iron deficiency anemia.  Also likely partly dilutional. -Patient with no overt bleeding. -Anemia panel with iron level of 38, folate of 3.4, ferritin > 7500. -Hemoglobin currently stable at 10.1. -Continue folate 1 mg daily.   -Follow H&H. -Transfusion threshold hemoglobin < 7.  11.  E. coli UTI -Urine cultures with 60,000 colonies of E. coli.   -Patient presented with SIRS picture, immunocompromised on chronic steroids.  -Status post 3 days IV cefepime.  12.  Anasarca--per CT abdomen and pelvis -Patient with noted anasarca with interstitial pulmonary edema, trace ascites, trace bilateral pleural effusions and diffuse body wall edema on CT abdomen and pelvis. -Likely secondary to hypoalbuminemia as patient with a albumin level < 1.5. -Status post IV albumin every 6 hours x 24 hours.   -Currently on Lasix 40 mg IV every 12 hours.   -Will discontinue IV Lasix after tonight's dose.  -Patient with a urine output of 5.8 L over the past 24 hours. -Monitor urine output. -Continue Ensure supplementation.  13.??  Colitis -CT abdomen and pelvis after differential bowel wall thickening involving the cecum and mild  pericecal inflammatory stranding which may reflect changes of an infectious or inflammatory colitis. -Patient denies any diarrhea, denies any abdominal pain. -Status post 3 days IV  cefepime. -Discussed with ID no further antibiotic treatment needed at this time  14.  Acute metabolic encephalopathy -Patient noted to have a bout of encephalopathy the evening of 05/17/2023 when she was not communicating however following commands and nodding head yes or no. -Given a dose of Ativan with clinical improvement. -Patient with no focal neurological deficits. -Mental status improved and currently at baseline.    DVT prophylaxis: SCDs Code Status: Full Family Communication: Updated patient.  No family at bedside. Disposition: Awaiting transfer to [facility].   Status is: Inpatient Remains inpatient appropriate because: Severity of illness   Consultants:  ID: Dr. Renold Don 04/16/2023 Hematology/oncology: Dr.Kale 04/17/2023  Procedures:  Chest x-ray 04/14/2023 CT chest/abdomen/pelvis 04/16/2023  Antimicrobials:  Anti-infectives (From admission, onward)    Start     Dose/Rate Route Frequency Ordered Stop   04/18/23 0000  nirmatrelvir/ritonavir (PAXLOVID) 20 x 150 MG & 10 x 100MG  TABS        3 tablet Oral 2 times daily 04/18/23 1616 04/23/23 2359   04/16/23 1800  ceFEPIme (MAXIPIME) 2 g in sodium chloride 0.9 % 100 mL IVPB        2 g 200 mL/hr over 30 Minutes Intravenous Every 8 hours 04/16/23 1400 04/16/23 2055   04/15/23 0600  vancomycin (VANCOCIN) IVPB 750 mg/150 ml premix  Status:  Discontinued       Placed in "Followed by" Linked Group   750 mg 150 mL/hr over 60 Minutes Intravenous Every 12 hours 04/14/23 1704 04/15/23 0140   04/15/23 0600  vancomycin (VANCOREADY) IVPB 750 mg/150 mL  Status:  Discontinued        750 mg 150 mL/hr over 60 Minutes Intravenous Every 12 hours 04/15/23 0141 04/17/23 1025   04/14/23 2200  nirmatrelvir/ritonavir (PAXLOVID) 3 tablet        3 tablet Oral 2 times daily 04/14/23 1627 04/18/23 2140   04/14/23 1800  ceFEPIme (MAXIPIME) 2 g in sodium chloride 0.9 % 100 mL IVPB  Status:  Discontinued        2 g 200 mL/hr over 30 Minutes Intravenous Every 8  hours 04/14/23 1704 04/16/23 1400   04/14/23 1800  Vancomycin (VANCOCIN) 1,250 mg in sodium chloride 0.9 % 250 mL IVPB       Placed in "Followed by" Linked Group   1,250 mg 166.7 mL/hr over 90 Minutes Intravenous  Once 04/14/23 1704 04/14/23 1935         Subjective: Patient sitting up in bed, using ice cream to make a milkshake.  Stated had a bout of nausea and emesis last night but felt much better this morning.  States she ate all her breakfast this morning and eating well.  Patient states she is feeling much better this morning.  Denies any chest pain or shortness of breath.  Patient stated had some soft loose stool x 1 yesterday.  Objective: Vitals:   04/18/23 2028 04/18/23 2343 04/19/23 0251 04/19/23 1000  BP: 127/82 (!) 119/90 (!) 140/79 126/86  Pulse: 81 85 83   Resp: 18 19 14 17   Temp: 98 F (36.7 C)  98.1 F (36.7 C)   TempSrc: Oral  Oral   SpO2: 98% 97% 98% 98%  Weight:      Height:        Intake/Output Summary (Last 24 hours) at 04/19/2023 1024 Last data filed at  04/19/2023 0300 Gross per 24 hour  Intake 775.96 ml  Output 4800 ml  Net -4024.04 ml   Filed Weights   04/14/23 1619 04/14/23 2050 04/16/23 0303  Weight: 54.5 kg 55.3 kg 58.6 kg    Examination:  General exam: NAD. Respiratory system: Lungs clear to auscultation bilaterally.  No wheezes, no crackles, no rhonchi.  Fair air movement.  Speaking in full sentences.  Cardiovascular system: RRR no murmurs rubs or gallops.  No JVD.  No pitting lower extremity edema. Gastrointestinal system: Abdomen is soft, nontender, nondistended, positive bowel sounds.  No rebound.  No guarding. Central nervous system: Alert and oriented. No focal neurological deficits. Extremities: Symmetric 5 x 5 power. Skin: No rashes, lesions or ulcers Psychiatry: Judgement and insight appear normal. Mood & affect appropriate.     Data Reviewed: I have personally reviewed following labs and imaging studies  CBC: Recent Labs  Lab  04/16/23 0406 04/16/23 1532 04/17/23 0350 04/17/23 1607 04/18/23 0830 04/18/23 0831 04/18/23 1042 04/19/23 0502 04/19/23 0503  WBC 24.5*  --  13.1*  --  9.9  --  10.5 3.7*  --   NEUTROABS 17.1*  --  8.2*  --  4.5  --  8.1* 2.4  --   HGB 11.3*  --  12.2  --  9.1*  --  10.5* 10.1*  --   HCT 33.4*  --  35.7*  --  27.1*  --  30.6* 30.0*  --   MCV 75.2*  --  73.5*  --  74.9*  --  74.5* 75.2*  --   PLT 95*   < > 91*   < > 97* 98* 99* 115* 118*   < > = values in this interval not displayed.    Basic Metabolic Panel: Recent Labs  Lab 04/14/23 1722 04/15/23 0329 04/16/23 0406 04/17/23 0350 04/18/23 0830 04/19/23 0502  NA  --  132* 131* 133* 137 137  K  --  3.6 3.6 4.4 3.1* 4.2  CL  --  108 105 106 105 100  CO2  --  20* 17* 19* 25 25  GLUCOSE  --  73 89 88 86 121*  BUN  --  8 11 15 12 10   CREATININE  --  0.69 0.78 0.78 0.57 0.51  CALCIUM  --  6.7* 7.1* 7.3* 7.7* 8.0*  MG 1.4* 1.5* 2.1  --  1.9  --   PHOS  --  2.4*  --   --  1.9*  --     GFR: Estimated Creatinine Clearance: 75.4 mL/min (by C-G formula based on SCr of 0.51 mg/dL).  Liver Function Tests: Recent Labs  Lab 04/14/23 1237 04/15/23 0329 04/16/23 0406 04/18/23 0830 04/19/23 0502  AST 109* 93* 80* 48* 61*  ALT 25 25 27 23 29   ALKPHOS 128* 105 131* 98 125  BILITOT 0.4 0.6 0.6 0.6 0.4  PROT 5.5* 4.7* 4.7* 5.6* 5.9*  ALBUMIN 1.6* <1.5* <1.5* 2.5* 2.9*    CBG: No results for input(s): "GLUCAP" in the last 168 hours.   Recent Results (from the past 240 hours)  Resp panel by RT-PCR (RSV, Flu A&B, Covid) Anterior Nasal Swab     Status: Abnormal   Collection Time: 04/14/23 12:36 PM   Specimen: Anterior Nasal Swab  Result Value Ref Range Status   SARS Coronavirus 2 by RT PCR POSITIVE (A) NEGATIVE Final   Influenza A by PCR NEGATIVE NEGATIVE Final   Influenza B by PCR NEGATIVE NEGATIVE Final    Comment: (  NOTE) The Xpert Xpress SARS-CoV-2/FLU/RSV plus assay is intended as an aid in the diagnosis of influenza  from Nasopharyngeal swab specimens and should not be used as a sole basis for treatment. Nasal washings and aspirates are unacceptable for Xpert Xpress SARS-CoV-2/FLU/RSV testing.  Fact Sheet for Patients: BloggerCourse.com  Fact Sheet for Healthcare Providers: SeriousBroker.it  This test is not yet approved or cleared by the [place] FDA and has been authorized for detection and/or diagnosis of SARS-CoV-2 by FDA under an Emergency Use Authorization (EUA). This EUA will remain in effect (meaning this test can be used) for the duration of the COVID-19 declaration under Section 564(b)(1) of the Act, 21 U.S.C. section 360bbb-3(b)(1), unless the authorization is terminated or revoked.     Resp Syncytial Virus by PCR NEGATIVE NEGATIVE Final    Comment: (NOTE) Fact Sheet for Patients: BloggerCourse.com  Fact Sheet for Healthcare Providers: SeriousBroker.it  This test is not yet approved or cleared by the [place] FDA and has been authorized for detection and/or diagnosis of SARS-CoV-2 by FDA under an Emergency Use Authorization (EUA). This EUA will remain in effect (meaning this test can be used) for the duration of the COVID-19 declaration under Section 564(b)(1) of the Act, 21 U.S.C. section 360bbb-3(b)(1), unless the authorization is terminated or revoked.  Performed at [facility], [address], [place], [place] [address]   Culture, blood (routine x 2)     Status: None   Collection Time: 04/14/23 12:37 PM   Specimen: BLOOD RIGHT FOREARM  Result Value Ref Range Status   Specimen Description BLOOD RIGHT FOREARM  Final   Special Requests   Final    BOTTLES DRAWN AEROBIC AND ANAEROBIC Blood Culture adequate volume   Culture   Final    NO GROWTH 5 DAYS Performed at [facility], [address], [place], [place] [address]    Report Status 04/19/2023  FINAL  Final  Culture, blood (routine x 2)     Status: None   Collection Time: 04/14/23 12:42 PM   Specimen: BLOOD  Result Value Ref Range Status   Specimen Description BLOOD RIGHT ANTECUBITAL  Final   Special Requests   Final    BOTTLES DRAWN AEROBIC AND ANAEROBIC Blood Culture adequate volume   Culture   Final    NO GROWTH 5 DAYS Performed at [facility], [address], [place], [place] [address]    Report Status 04/19/2023 FINAL  Final  Urine Culture     Status: Abnormal   Collection Time: 04/14/23  4:10 PM   Specimen: Urine, Clean Catch  Result Value Ref Range Status   Specimen Description URINE, CLEAN CATCH  Final   Special Requests   Final    Immunocompromised Performed at [facility], [address], [place], [place] [address]    Culture (A)  Final    60,000 COLONIES/mL ESCHERICHIA COLI >=100,000 COLONIES/mL STREPTOCOCCUS ANGINOSIS    Report Status 04/18/2023 FINAL  Final   Organism ID, Bacteria ESCHERICHIA COLI (A)  Final   Organism ID, Bacteria STREPTOCOCCUS ANGINOSIS (A)  Final      Susceptibility   Escherichia coli - MIC*    AMPICILLIN >=32 RESISTANT Resistant     CEFAZOLIN <=4 SENSITIVE Sensitive     CEFEPIME <=0.12 SENSITIVE Sensitive     CEFTRIAXONE <=0.25 SENSITIVE Sensitive     CIPROFLOXACIN <=0.25 SENSITIVE Sensitive     GENTAMICIN <=1 SENSITIVE Sensitive     IMIPENEM <=0.25 SENSITIVE Sensitive  NITROFURANTOIN <=16 SENSITIVE Sensitive     TRIMETH/SULFA <=20 SENSITIVE Sensitive     AMPICILLIN/SULBACTAM 8 SENSITIVE Sensitive     PIP/TAZO <=4 SENSITIVE Sensitive ug/mL    * 60,000 COLONIES/mL ESCHERICHIA COLI   Streptococcus anginosis - MIC*    PENICILLIN <=0.06 SENSITIVE Sensitive     CEFTRIAXONE 0.25 SENSITIVE Sensitive     ERYTHROMYCIN <=0.12 SENSITIVE Sensitive     LEVOFLOXACIN <=0.25 SENSITIVE Sensitive     VANCOMYCIN 1 SENSITIVE Sensitive     * >=100,000 COLONIES/mL STREPTOCOCCUS ANGINOSIS         Radiology Studies: No  results found.       Scheduled Meds:  sodium chloride   Intravenous Once   anakinra  100 mg Subcutaneous Q24H   feeding supplement  237 mL Oral BID BM   folic acid  1 mg Oral Daily   furosemide  40 mg Intravenous Q12H   melatonin  3 mg Oral QHS   pantoprazole (PROTONIX) IV  40 mg Intravenous Q24H   phytonadione  5 mg Oral Daily   senna-docusate  1 tablet Oral BID   sodium chloride flush  3 mL Intravenous Q12H   Continuous Infusions:  methylPREDNISolone (SOLU-MEDROL) injection 1,000 mg (04/19/23 1013)      LOS: 5 days    Time spent: 40 mins    Ramiro Harvest, MD [facility]   To contact the attending provider between 7A-7P or the covering provider during after hours 7P-7A, please log into the web site [email or web address] and access using universal [place] password for that web site. If you do not have the password, please call the hospital operator.  04/19/2023, 10:24 AM

## 2023-04-20 DIAGNOSIS — U071 COVID-19: Secondary | ICD-10-CM | POA: Diagnosis not present

## 2023-04-20 DIAGNOSIS — D761 Hemophagocytic lymphohistiocytosis: Secondary | ICD-10-CM | POA: Diagnosis not present

## 2023-04-20 LAB — COMPREHENSIVE METABOLIC PANEL WITH GFR
ALT: 25 U/L (ref 0–44)
AST: 42 U/L — ABNORMAL HIGH (ref 15–41)
Albumin: 2.8 g/dL — ABNORMAL LOW (ref 3.5–5.0)
Alkaline Phosphatase: 129 U/L — ABNORMAL HIGH (ref 38–126)
Anion gap: 7 (ref 5–15)
BUN: 20 mg/dL (ref 6–20)
CO2: 31 mmol/L (ref 22–32)
Calcium: 8.3 mg/dL — ABNORMAL LOW (ref 8.9–10.3)
Chloride: 100 mmol/L (ref 98–111)
Creatinine, Ser: 0.72 mg/dL (ref 0.44–1.00)
GFR, Estimated: >60 mL/min (ref >60)
Glucose, Bld: 153 mg/dL — ABNORMAL HIGH (ref 70–99)
Potassium: 4.4 mmol/L (ref 3.5–5.1)
Sodium: 138 mmol/L (ref 135–145)
Total Bilirubin: 0.5 mg/dL (ref 0.0–1.2)
Total Protein: 6 g/dL — ABNORMAL LOW (ref 6.5–8.1)

## 2023-04-20 LAB — CBC WITH DIFFERENTIAL/PLATELET
Abs Immature Granulocytes: 0.21 K/uL — ABNORMAL HIGH (ref 0.00–0.07)
Basophils Absolute: 0 K/uL (ref 0.0–0.1)
Basophils Relative: 0 %
Eosinophils Absolute: 0 K/uL (ref 0.0–0.5)
Eosinophils Relative: 0 %
HCT: 31.4 % — ABNORMAL LOW (ref 36.0–46.0)
Hemoglobin: 10.6 g/dL — ABNORMAL LOW (ref 12.0–15.0)
Immature Granulocytes: 3 %
Lymphocytes Relative: 11 %
Lymphs Abs: 0.8 K/uL (ref 0.7–4.0)
MCH: 26.1 pg (ref 26.0–34.0)
MCHC: 33.8 g/dL (ref 30.0–36.0)
MCV: 77.3 fL — ABNORMAL LOW (ref 80.0–100.0)
Monocytes Absolute: 1 K/uL (ref 0.1–1.0)
Monocytes Relative: 13 %
Neutro Abs: 5.2 K/uL (ref 1.7–7.7)
Neutrophils Relative %: 73 %
Platelets: 166 K/uL (ref 150–400)
RBC: 4.06 MIL/uL (ref 3.87–5.11)
RDW: 19.8 % — ABNORMAL HIGH (ref 11.5–15.5)
WBC: 7.2 K/uL (ref 4.0–10.5)
nRBC: 0 % (ref 0.0–0.2)

## 2023-04-20 LAB — DIC (DISSEMINATED INTRAVASCULAR COAGULATION)PANEL
D-Dimer, Quant: 2.94 ug{FEU}/mL — ABNORMAL HIGH (ref 0.00–0.50)
Fibrinogen: 94 mg/dL — CL (ref 210–475)
INR: 1.2 (ref 0.8–1.2)
Platelets: 160 10*3/uL (ref 150–400)
Prothrombin Time: 15.6 s — ABNORMAL HIGH (ref 11.4–15.2)
Smear Review: NONE SEEN
aPTT: 25 s (ref 24–36)

## 2023-04-20 LAB — PHOSPHORUS: Phosphorus: 3 mg/dL (ref 2.5–4.6)

## 2023-04-20 LAB — C-REACTIVE PROTEIN: CRP: 2.4 mg/dL — ABNORMAL HIGH (ref <1.0)

## 2023-04-20 MED ORDER — PREDNISONE 10 MG PO TABS: 10.0000 mg | ORAL_TABLET | Freq: Every day | ORAL | Status: DC

## 2023-04-20 NOTE — TOC CM/SW Note (Signed)
 Call made to the [facility]- confirmed pt remains on transfer list- no bed available as of this time today. Updated vitals and clinical info provided to transfer center. They will call us when bed is available.  [facility] # [phone]
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
## 2023-04-20 NOTE — Progress Notes (Signed)
 PROGRESS NOTE    Melissa Little  ZOX:[id number] DOB: 26-Dec-1985 DOA: 04/14/2023 PCP: [facility]    Chief Complaint  Patient presents with   Nausea   Shortness of Breath    Brief Narrative:  Patient 38 year old female history of adult stills disease, rheumatoid arthritis on chronic steroids and anakinra presented to ED with 3-day history of flulike symptoms.  Workup in the ED consistent with COVID-19 infection.  Patient also noted to have a SIRS picture and pancultured and placed empirically on IV antibiotics pending culture results.  Patient also started on Paxlovid.  Patient noted to have a significant leukocytosis, ID consulted, patient underwent CT chest abdomen and pelvis and antibiotics discontinued.  Leukocytosis improved.  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  Patient received cryoprecipitate x 2 during the hospitalization and hematology recommended transfer to tertiary care center at [facility].  Currently awaiting transfer to tertiary care center at [facility].  Patient remains medically stable for DC pending bed availability at [facility].  Per TOC check, still on waiting list at [facility].   Assessment & Plan:   Principal Problem:   COVID-19 Active Problems:   SIRS (systemic inflammatory response syndrome) (HCC)   MAS (macrophage activation syndrome) (HCC)   Anxiety   Adult-onset Still's disease (HCC)   Dehydration   Hyponatremia   Hypokalemia   UTI (urinary tract infection)   Hypomagnesemia   Hypophosphatemia   Lactic acidosis   Leukocytosis   Acute encephalopathy   Anasarca   Hypofibrinogenemia (HCC)   Elevated LDH  #1 COVID-19 infection -Patient presented with 3-day history of myalgias, chills, shortness of breath, nonproductive cough, sore throat, decreased oral intake. -COVID-19 PCR positive.   Influenza A and B PCR negative.    RSV by PCR  negative. -CRP was markedly elevated which has progressively decreased to 2.4.  Do not see need to continue checking, discontinued order. -Patient with history of stills disease on chronic steroids and anakinra and as such patient started on Paxlovid-completed 4 Sona/1. -Continue stress dose steroids as patient noted to chronically be on oral prednisone 10 mg daily prior to admission and also secondary to problem #3.  She completed Solu-Medrol 1 g daily x 3 days today.  Will check with hematology and restart home dose of prednisone 10 Mg daily unless higher dose as recommended by them. -Due to concern for macrophage activation syndrome patient awaiting transfer to tertiary care center at [facility]. -Continue supportive care   2.  SIRS/leukocytosis -Patient on presentation met criteria for SIRS as patient noted to be tachycardic, tachypneic, have a significant leukocytosis and the elevated lactic acid level. -Likely secondary to problem #1. -Chest x-ray done with no acute infiltrate. -Blood cultures ordered and pending with no growth, final report. -UA with concerns for possible UTI.   -Urine cultures with 60,000 colonies of E. Coli .and > 100,000 colonies of Streptococcus aginosis.   UTI ruled out.  If -Lactic acidosis resolved with hydration.   -Due to history of stills disease, on chronic steroid and DMARD patient placed empirically on IV vancomycin, IV cefepime on admission.   -Patient with a worsening leukocytosis and as such seen in consultation by ID who recommended CT chest abdomen and pelvis which was done.   -ID also recommended discontinuation of antibiotics.   -Leukocytosis has resolved. -ID was following but has signed off. -Awaiting transfer to tertiary care center at [facility].  3.Macrophage activation  syndrome/HLH -Patient noted with a elevated LDH of 1375, fibrinogen decreased at less than 60, no schistocytes noted, patient with thrombocytopenia. -Patient noted with no overt  bleeding. -Patient seen in consultation by hematology who ordered a DIC panel. 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 -Patient noted to have severely elevated ferritin levels of > 7500, triglycerides of 692, hypofibrinogenemia which was severe without significant schistocytes to suggest DIC in the setting of her underlying stills disease suggested MAS/HLH that was not optimally controlled on current treatment during the hospitalization. -Hematology for outpatient with a high likelihood that patient has relapse/progression of her HLH/MAS from his stills disease and potential additional inflammatory insults from a COVID-19 infection. -It was felt patient's case was complicated and would best be managed by her primary rheumatologist and recommended transfer to tertiary care center. -Patient received cryoprecipitate x 2 to keep fibrinogen levels at approximately 100 to decrease the risk of spontaneous bleeding per hematology recommendations. -Fibrinogen level this morning at 103, D-dimer trending down currently at 3.13 from 3.70 from 7.34. -INR at 1.3. -Patient's prednisone dose was changed to Solu-Medrol 1000 mg IV daily (per hematology recommendations) while awaiting transfer to tertiary care center at [facility].  Completed 3-day course.  Please see discussion above. -Awaiting for transfer to tertiary care center at [facility].  4.  Lactic acidosis -Likely secondary to dehydration. -Resolved with hydration.    5.  Dehydration -Hydrated with IV fluids.  -IV fluids discontinued.   6.  Hypokalemia/hypophosphatemia/hypomagnesemia -Secondary to GI losses. -Magnesium noted at 1.9, phosphorus noted at 1.9 and patient received K-Phos 30 mmol IV x 1 on 04/18/2023.  -Awaiting transfer to tertiary care center at [facility].   7.  Hyponatremia -Likely secondary to hypovolemic  hyponatremia. -Urine sodium of 25, urine creatinine 111, urine osmolality 406.   -Serum osmolality 276.   Completed IV Lasix on 4/1.  Resolved.   8.  History of stills disease/rheumatoid arthritis -Due to concern for acute infection home regimen prednisone was increased up to 40 Mg daily which was then changed to high-dose IV Solu-Medrol x 3 days. -Due to concerns for macrophage activation syndrome/HLH hematology recommended increasing steroids to Solu-Medrol 1000 mg IV daily x 3 days while awaiting transfer to tertiary care center at [facility].  -Continue home regimen anakinra. -Awaiting transfer to [facility] for further management.   9.  Thrombocytopenia -Likely secondary to acute infection. -Patient with no overt bleeding. -Slowly trending back up. Resolved today.   10. microcytic anemia/folate deficiency -Likely iron deficiency anemia.  Also likely partly dilutional. -Patient with no overt bleeding. -Anemia panel with iron level of 38, folate of 3.4, ferritin > 7500. -Hemoglobin currently stable at 10.1. -Continue folate 1 mg daily.   -Follow H&H. -Transfusion threshold hemoglobin < 7.  11.  E. coli UTI -Urine cultures with 60,000 colonies of E. coli.   -Patient presented with SIRS picture, immunocompromised on chronic steroids.  -Status post 3 days IV cefepime.  12.  Anasarca--per CT abdomen and pelvis -Patient with noted anasarca with interstitial pulmonary edema, trace ascites, trace bilateral pleural effusions and diffuse body wall edema on CT abdomen and pelvis. -Likely secondary to hypoalbuminemia as patient with a albumin level < 1.5. -Status post IV albumin every 6 hours x 24 hours.   -S/p Lasix 40 mg IV every 12 hours.   -Clinically euvolemic.  13.??  Colitis -CT abdomen and pelvis  after differential bowel wall thickening involving the cecum and mild pericecal inflammatory stranding which may reflect changes of an infectious or inflammatory colitis. -Patient denies  any diarrhea, denies any abdominal pain. -Status post 3 days IV cefepime. -Discussed with ID no further antibiotic treatment needed at this time  14.  Acute metabolic encephalopathy -Patient noted to have a bout of encephalopathy the evening of 05/17/2023 when she was not communicating however following commands and nodding head yes or no. -Given a dose of Ativan with clinical improvement. -Patient with no focal neurological deficits. -Resolved   DVT prophylaxis: SCDs Code Status: Full Family Communication: Updated patient.  No family at bedside. Disposition: Awaiting transfer to [facility].   Status is: Inpatient Remains inpatient appropriate because: Severity of illness   Consultants:  ID: Dr. Renold Don 04/16/2023 Hematology/oncology: Dr.Kale 04/17/2023  Procedures:  Chest x-ray 04/14/2023 CT chest/abdomen/pelvis 04/16/2023  Antimicrobials:  Anti-infectives (From admission, onward)    Start     Dose/Rate Route Frequency Ordered Stop   04/18/23 0000  nirmatrelvir/ritonavir (PAXLOVID) 20 x 150 MG & 10 x 100MG  TABS        3 tablet Oral 2 times daily 04/18/23 1616 04/23/23 2359   04/16/23 1800  ceFEPIme (MAXIPIME) 2 g in sodium chloride 0.9 % 100 mL IVPB        2 g 200 mL/hr over 30 Minutes Intravenous Every 8 hours 04/16/23 1400 04/16/23 2055   04/15/23 0600  vancomycin (VANCOCIN) IVPB 750 mg/150 ml premix  Status:  Discontinued       Placed in "Followed by" Linked Group   750 mg 150 mL/hr over 60 Minutes Intravenous Every 12 hours 04/14/23 1704 04/15/23 0140   04/15/23 0600  vancomycin (VANCOREADY) IVPB 750 mg/150 mL  Status:  Discontinued        750 mg 150 mL/hr over 60 Minutes Intravenous Every 12 hours 04/15/23 0141 04/17/23 1025   04/14/23 2200  nirmatrelvir/ritonavir (PAXLOVID) 3 tablet        3 tablet Oral 2 times daily 04/14/23 1627 04/18/23 2140   04/14/23 1800  ceFEPIme (MAXIPIME) 2 g in sodium chloride 0.9 % 100 mL IVPB  Status:  Discontinued        2 g 200 mL/hr over 30  Minutes Intravenous Every 8 hours 04/14/23 1704 04/16/23 1400   04/14/23 1800  Vancomycin (VANCOCIN) 1,250 mg in sodium chloride 0.9 % 250 mL IVPB       Placed in "Followed by" Linked Group   1,250 mg 166.7 mL/hr over 90 Minutes Intravenous  Once 04/14/23 1704 04/14/23 1935         Subjective: Denies complaints.  States that she feels "great".  Tolerating diet.  Ambulating in the room by herself.  No complaints of pain, fever, shortness of breath, dizziness or lightheadedness.  Objective: Vitals:   04/19/23 2018 04/19/23 2326 04/20/23 0331 04/20/23 0913  BP: 118/72 (!) 130/90 112/73 116/66  Pulse: 87 88 75 85  Resp: 15 13 12 18   Temp: 98.3 F (36.8 C) 97.6 F (36.4 C) 98.4 F (36.9 C) 97.7 F (36.5 C)  TempSrc: Oral Oral Oral Oral  SpO2: 96% 95% 91% 96%  Weight:   58.5 kg   Height:        Intake/Output Summary (Last 24 hours) at 04/20/2023 1447 Last data filed at 04/20/2023 0908 Gross per 24 hour  Intake 422 ml  Output --  Net 422 ml   Filed Weights   04/14/23 2050 04/16/23 0303 04/20/23 0331  Weight: 55.3  kg 58.6 kg 58.5 kg    Examination:  General exam: Young female, moderately built and nourished lying comfortably supine in bed without distress. Respiratory system: Clear to auscultation.  No increased work of breathing. Cardiovascular system: RRR no murmurs rubs or gallops.  No JVD.  No pitting lower extremity edema.  Telemetry personally reviewed: Sinus rhythm-discontinued telemetry since did not see further needs. Gastrointestinal system: Abdomen is soft, nontender, nondistended, positive bowel sounds.  No rebound.  No guarding. Central nervous system: Alert and oriented. No focal neurological deficits. Extremities: Symmetric 5 x 5 power. Skin: No rashes, lesions or ulcers Psychiatry: Judgement and insight appear normal. Mood & affect appropriate.     Data Reviewed: I have personally reviewed following labs and imaging studies  CBC: Recent Labs  Lab  04/17/23 0350 04/17/23 1607 04/18/23 0830 04/18/23 0831 04/18/23 1042 04/19/23 0502 04/19/23 0503 04/20/23 0328 04/20/23 0329  WBC 13.1*  --  9.9  --  10.5 3.7*  --  7.2  --   NEUTROABS 8.2*  --  4.5  --  8.1* 2.4  --  5.2  --   HGB 12.2  --  9.1*  --  10.5* 10.1*  --  10.6*  --   HCT 35.7*  --  27.1*  --  30.6* 30.0*  --  31.4*  --   MCV 73.5*  --  74.9*  --  74.5* 75.2*  --  77.3*  --   PLT 91*   < > 97*   < > 99* 115* 118* 166 160   < > = values in this interval not displayed.    Basic Metabolic Panel: Recent Labs  Lab 04/14/23 1722 04/15/23 0329 04/16/23 0406 04/17/23 0350 04/18/23 0830 04/19/23 0502 04/19/23 1304 04/20/23 0329  NA  --  132* 131* 133* 137 137  --  138  K  --  3.6 3.6 4.4 3.1* 4.2  --  4.4  CL  --  108 105 106 105 100  --  100  CO2  --  20* 17* 19* 25 25  --  31  GLUCOSE  --  73 89 88 86 121*  --  153*  BUN  --  8 11 15 12 10   --  20  CREATININE  --  0.69 0.78 0.78 0.57 0.51  --  0.72  CALCIUM  --  6.7* 7.1* 7.3* 7.7* 8.0*  --  8.3*  MG 1.4* 1.5* 2.1  --  1.9  --   --   --   PHOS  --  2.4*  --   --  1.9*  --  2.8 3.0    GFR: Estimated Creatinine Clearance: 75.4 mL/min (by C-G formula based on SCr of 0.72 mg/dL).  Liver Function Tests: Recent Labs  Lab 04/15/23 0329 04/16/23 0406 04/18/23 0830 04/19/23 0502 04/20/23 0329  AST 93* 80* 48* 61* 42*  ALT 25 27 23 29 25   ALKPHOS 105 131* 98 125 129*  BILITOT 0.6 0.6 0.6 0.4 0.5  PROT 4.7* 4.7* 5.6* 5.9* 6.0*  ALBUMIN <1.5* <1.5* 2.5* 2.9* 2.8*    CBG: No results for input(s): "GLUCAP" in the last 168 hours.   Recent Results (from the past 240 hours)  Resp panel by RT-PCR (RSV, Flu A&B, Covid) Anterior Nasal Swab     Status: Abnormal   Collection Time: 04/14/23 12:36 PM   Specimen: Anterior Nasal Swab  Result Value Ref Range Status   SARS Coronavirus 2 by RT PCR POSITIVE (A) NEGATIVE  Final   Influenza A by PCR NEGATIVE NEGATIVE Final   Influenza B by PCR NEGATIVE NEGATIVE Final     Comment: (NOTE) The Xpert Xpress SARS-CoV-2/FLU/RSV plus assay is intended as an aid in the diagnosis of influenza from Nasopharyngeal swab specimens and should not be used as a sole basis for treatment. Nasal washings and aspirates are unacceptable for Xpert Xpress SARS-CoV-2/FLU/RSV testing.  Fact Sheet for Patients: BloggerCourse.com  Fact Sheet for Healthcare Providers: SeriousBroker.it  This test is not yet approved or cleared by the [place] FDA and has been authorized for detection and/or diagnosis of SARS-CoV-2 by FDA under an Emergency Use Authorization (EUA). This EUA will remain in effect (meaning this test can be used) for the duration of the COVID-19 declaration under Section 564(b)(1) of the Act, 21 U.S.C. section 360bbb-3(b)(1), unless the authorization is terminated or revoked.     Resp Syncytial Virus by PCR NEGATIVE NEGATIVE Final    Comment: (NOTE) Fact Sheet for Patients: BloggerCourse.com  Fact Sheet for Healthcare Providers: SeriousBroker.it  This test is not yet approved or cleared by the [place] FDA and has been authorized for detection and/or diagnosis of SARS-CoV-2 by FDA under an Emergency Use Authorization (EUA). This EUA will remain in effect (meaning this test can be used) for the duration of the COVID-19 declaration under Section 564(b)(1) of the Act, 21 U.S.C. section 360bbb-3(b)(1), unless the authorization is terminated or revoked.  Performed at [facility], [address], [place], [place] [address]   Culture, blood (routine x 2)     Status: None   Collection Time: 04/14/23 12:37 PM   Specimen: BLOOD RIGHT FOREARM  Result Value Ref Range Status   Specimen Description BLOOD RIGHT FOREARM  Final   Special Requests   Final    BOTTLES DRAWN AEROBIC AND ANAEROBIC Blood Culture adequate volume   Culture   Final    NO GROWTH 5  DAYS Performed at [facility], [address], [place], [place] [address]    Report Status 04/19/2023 FINAL  Final  Culture, blood (routine x 2)     Status: None   Collection Time: 04/14/23 12:42 PM   Specimen: BLOOD  Result Value Ref Range Status   Specimen Description BLOOD RIGHT ANTECUBITAL  Final   Special Requests   Final    BOTTLES DRAWN AEROBIC AND ANAEROBIC Blood Culture adequate volume   Culture   Final    NO GROWTH 5 DAYS Performed at [facility], [address], [place], [place] [address]    Report Status 04/19/2023 FINAL  Final  Urine Culture     Status: Abnormal   Collection Time: 04/14/23  4:10 PM   Specimen: Urine, Clean Catch  Result Value Ref Range Status   Specimen Description URINE, CLEAN CATCH  Final   Special Requests   Final    Immunocompromised Performed at [facility], [address] [place], [place] [address]    Culture (A)  Final    60,000 COLONIES/mL ESCHERICHIA COLI >=100,000 COLONIES/mL STREPTOCOCCUS ANGINOSIS    Report Status 04/18/2023 FINAL  Final   Organism ID, Bacteria ESCHERICHIA COLI (A)  Final   Organism ID, Bacteria STREPTOCOCCUS ANGINOSIS (A)  Final      Susceptibility   Escherichia coli - MIC*    AMPICILLIN >=32 RESISTANT Resistant     CEFAZOLIN <=4 SENSITIVE Sensitive     CEFEPIME <=0.12 SENSITIVE Sensitive     CEFTRIAXONE <=0.25 SENSITIVE Sensitive  CIPROFLOXACIN <=0.25 SENSITIVE Sensitive     GENTAMICIN <=1 SENSITIVE Sensitive     IMIPENEM <=0.25 SENSITIVE Sensitive     NITROFURANTOIN <=16 SENSITIVE Sensitive     TRIMETH/SULFA <=20 SENSITIVE Sensitive     AMPICILLIN/SULBACTAM 8 SENSITIVE Sensitive     PIP/TAZO <=4 SENSITIVE Sensitive ug/mL    * 60,000 COLONIES/mL ESCHERICHIA COLI   Streptococcus anginosis - MIC*    PENICILLIN <=0.06 SENSITIVE Sensitive     CEFTRIAXONE 0.25 SENSITIVE Sensitive     ERYTHROMYCIN <=0.12 SENSITIVE Sensitive     LEVOFLOXACIN <=0.25 SENSITIVE Sensitive     VANCOMYCIN 1  SENSITIVE Sensitive     * >=100,000 COLONIES/mL STREPTOCOCCUS ANGINOSIS         Radiology Studies: No results found.       Scheduled Meds:  sodium chloride   Intravenous Once   anakinra  100 mg Subcutaneous Q24H   feeding supplement  237 mL Oral BID BM   folic acid  1 mg Oral Daily   melatonin  3 mg Oral QHS   pantoprazole (PROTONIX) IV  40 mg Intravenous Q24H   phytonadione  5 mg Oral Daily   senna-docusate  1 tablet Oral BID   sodium chloride flush  3 mL Intravenous Q12H   Continuous Infusions:      LOS: 6 days    Time spent: 20 mins    Marcellus Scott, MD [facility]   To contact the attending provider between 7A-7P or the covering provider during after hours 7P-7A, please log into the web site [email or web address] and access using universal [place] password for that web site. If you do not have the password, please call the hospital operator.  04/20/2023, 2:47 PM

## 2023-04-20 NOTE — Progress Notes (Signed)
 Physical Therapy Treatment Patient Details Name: Melissa Little MRN: [id number] DOB: 1985/04/06 Today's Date: 04/20/2023   History of Present Illness 38 y.o. female presents to [facility] with 3 days of body aches, cough, N/V, sore throat, and weakness. Pt with Covid + diagnosis and SIRS. PMHx: still disease, RA, anxiety    PT Comments  Pt resting in bed on arrival and agreeable to session with continued progress towards acute goals. Pt limited in safe mobility by decreased activity tolerance with HR increasing to 136bpm with gait, requiring cues for slower gait cadence and self pacing with pt requiring standing rest and HR recovering to 118bpm. HR increasing to 145bpm on initial stand requiring seated rest to recover to low 100s. Physically pt requiring grossly CGA for safety during hallway ambulation with pt reaching for rail for support intermittently. Educated pt on energy conservation techniques and importance of frequent mobility to maximize endurance maintenance. Pt continues to benefit from skilled PT services to progress toward functional mobility goals.       If plan is discharge home, recommend the following: A little help with walking and/or transfers;Assist for transportation;Help with stairs or ramp for entrance   Can travel by private vehicle        Equipment Recommendations  Other (comment) (TBD)    Recommendations for Other Services       Precautions / Restrictions Precautions Precautions: Fall Recall of Precautions/Restrictions: Intact Precaution/Restrictions Comments: airborne precautions Restrictions Weight Bearing Restrictions Per Provider Order: No     Mobility  Bed Mobility Overal bed mobility: Modified Independent                  Transfers Overall transfer level: Independent Equipment used: None Transfers: Sit to/from Stand Sit to Stand: Supervision           General transfer comment: supervision to stand, reaches for UE support when available     Ambulation/Gait Ambulation/Gait assistance: Supervision Gait Distance (Feet): 300 Feet Assistive device: None (reaching for rail PRN) Gait Pattern/deviations: Step-through pattern, Decreased step length - right, Decreased step length - left Gait velocity: decr     General Gait Details: slow and fairly steady gaitm reaching for rail x3, HR up to 136bpm, cues to slow gait speed and for standing breaks   Stairs             Wheelchair Mobility     Tilt Bed    Modified Rankin (Stroke Patients Only)       Balance Overall balance assessment: Needs assistance, Mild deficits observed, not formally tested Sitting-balance support: No upper extremity supported, Feet supported Sitting balance-Leahy Scale: Normal     Standing balance support: No upper extremity supported Standing balance-Leahy Scale: Good                              Communication Communication Communication: No apparent difficulties  Cognition Arousal: Alert Behavior During Therapy: WFL for tasks assessed/performed   PT - Cognitive impairments: No apparent impairments                         Following commands: Intact      Cueing Cueing Techniques: Verbal cues  Exercises      General Comments General comments (skin integrity, edema, etc.): HR up to 145bpm with initial stand, stabilizing with seated rest to low 100s, up to 136bpm during gait, resolving to 118 with slower gait cadence  Pertinent Vitals/Pain Pain Assessment Pain Assessment: No/denies pain    Home Living                          Prior Function            PT Goals (current goals can now be found in the care plan section) Acute Rehab PT Goals Patient Stated Goal: to get stronger PT Goal Formulation: With patient Time For Goal Achievement: 04/29/23 Progress towards PT goals: Progressing toward goals    Frequency    Min 2X/week      PT Plan      Co-evaluation               AM-PAC PT "6 Clicks" Mobility   Outcome Measure  Help needed turning from your back to your side while in a flat bed without using bedrails?: None Help needed moving from lying on your back to sitting on the side of a flat bed without using bedrails?: None Help needed moving to and from a bed to a chair (including a wheelchair)?: A Little Help needed standing up from a chair using your arms (e.g., wheelchair or bedside chair)?: A Little Help needed to walk in hospital room?: A Little Help needed climbing 3-5 steps with a railing? : A Little 6 Click Score: 20    End of Session   Activity Tolerance: Patient tolerated treatment well Patient left: with call bell/phone within reach;in bed Nurse Communication: Mobility status PT Visit Diagnosis: Unsteadiness on feet (R26.81);Other abnormalities of gait and mobility (R26.89)     Time: 6213-0865 PT Time Calculation (min) (ACUTE ONLY): 14 min  Charges:    $Therapeutic Exercise: 8-22 mins PT General Charges $$ ACUTE PT VISIT: 1 Visit                     Dymon Summerhill R. PTA Acute Rehabilitation Services Office: [phone]   Catalina Antigua 04/20/2023, 4:13 PM

## 2023-04-20 NOTE — Plan of Care (Signed)

## 2023-04-20 NOTE — Progress Notes (Signed)
 Fibrinogen 94 with morning labs. Critical value called from lab. However, this is consistent with patient's previous values. MD to review in rounds.

## 2023-04-21 NOTE — Progress Notes (Signed)
 Report was given to Lelon Mast, Charity fundraiser at [place]. Pt home med was given to pt with transfer.

## 2023-05-08 ENCOUNTER — Emergency Department (HOSPITAL_COMMUNITY)

## 2023-05-08 ENCOUNTER — Emergency Department (HOSPITAL_COMMUNITY)
Admission: EM | Admit: 2023-05-08 | Discharge: 2023-05-08 | Disposition: A | Discharge status: Other Acute Inpatient Hospital | Attending: Emergency Medicine | Admitting: Emergency Medicine

## 2023-05-08 ENCOUNTER — Encounter (HOSPITAL_COMMUNITY): Payer: Self-pay

## 2023-05-08 DIAGNOSIS — R6510 Systemic inflammatory response syndrome (SIRS) of non-infectious origin without acute organ dysfunction: Secondary | ICD-10-CM | POA: Diagnosis not present

## 2023-05-08 DIAGNOSIS — R519 Headache, unspecified: Secondary | ICD-10-CM | POA: Insufficient documentation

## 2023-05-08 DIAGNOSIS — R509 Fever, unspecified: Secondary | ICD-10-CM | POA: Diagnosis present

## 2023-05-08 DIAGNOSIS — Z8616 Personal history of COVID-19: Secondary | ICD-10-CM | POA: Insufficient documentation

## 2023-05-08 DIAGNOSIS — M0820 Juvenile rheumatoid arthritis with systemic onset, unspecified site: Secondary | ICD-10-CM

## 2023-05-08 DIAGNOSIS — M061 Adult-onset Still's disease: Secondary | ICD-10-CM | POA: Diagnosis not present

## 2023-05-08 DIAGNOSIS — R791 Abnormal coagulation profile: Secondary | ICD-10-CM | POA: Diagnosis not present

## 2023-05-08 LAB — CBC WITH DIFFERENTIAL/PLATELET
Abs Immature Granulocytes: 0.55 10*3/uL — ABNORMAL HIGH (ref 0.00–0.07)
Basophils Absolute: 0.1 10*3/uL (ref 0.0–0.1)
Basophils Relative: 1 %
Eosinophils Absolute: 0.1 10*3/uL (ref 0.0–0.5)
Eosinophils Relative: 0 %
HCT: 36.8 % (ref 36.0–46.0)
Hemoglobin: 12.5 g/dL (ref 12.0–15.0)
Immature Granulocytes: 4 %
Lymphocytes Relative: 12 %
Lymphs Abs: 1.7 10*3/uL (ref 0.7–4.0)
MCH: 26.2 pg (ref 26.0–34.0)
MCHC: 34 g/dL (ref 30.0–36.0)
MCV: 77 fL — ABNORMAL LOW (ref 80.0–100.0)
Monocytes Absolute: 1.2 10*3/uL — ABNORMAL HIGH (ref 0.1–1.0)
Monocytes Relative: 9 %
Neutro Abs: 10.7 10*3/uL — ABNORMAL HIGH (ref 1.7–7.7)
Neutrophils Relative %: 74 %
Platelet Morphology: NORMAL
Platelets: 86 10*3/uL — ABNORMAL LOW (ref 150–400)
RBC: 4.78 MIL/uL (ref 3.87–5.11)
RDW: 21.4 % — ABNORMAL HIGH (ref 11.5–15.5)
WBC: 14.4 10*3/uL — ABNORMAL HIGH (ref 4.0–10.5)
nRBC: 0.1 % (ref 0.0–0.2)

## 2023-05-08 LAB — COMPREHENSIVE METABOLIC PANEL WITH GFR
ALT: 23 U/L (ref 0–44)
AST: 149 U/L — ABNORMAL HIGH (ref 15–41)
Albumin: 2.6 g/dL — ABNORMAL LOW (ref 3.5–5.0)
Alkaline Phosphatase: 73 U/L (ref 38–126)
Anion gap: 12 (ref 5–15)
BUN: 12 mg/dL (ref 6–20)
CO2: 18 mmol/L — ABNORMAL LOW (ref 22–32)
Calcium: 7.7 mg/dL — ABNORMAL LOW (ref 8.9–10.3)
Chloride: 93 mmol/L — ABNORMAL LOW (ref 98–111)
Creatinine, Ser: 0.76 mg/dL (ref 0.44–1.00)
GFR, Estimated: >60 mL/min (ref >60)
Glucose, Bld: 98 mg/dL (ref 70–99)
Potassium: 3.6 mmol/L (ref 3.5–5.1)
Sodium: 123 mmol/L — ABNORMAL LOW (ref 135–145)
Total Bilirubin: 0.6 mg/dL (ref 0.0–1.2)
Total Protein: 6 g/dL — ABNORMAL LOW (ref 6.5–8.1)

## 2023-05-08 LAB — HCG, SERUM, QUALITATIVE: Preg, Serum: NEGATIVE

## 2023-05-08 LAB — C-REACTIVE PROTEIN: CRP: 12.2 mg/dL — ABNORMAL HIGH (ref <1.0)

## 2023-05-08 LAB — RESP PANEL BY RT-PCR (RSV, FLU A&B, COVID)  RVPGX2
Influenza A by PCR: NEGATIVE
Influenza B by PCR: NEGATIVE
Resp Syncytial Virus by PCR: NEGATIVE
SARS Coronavirus 2 by RT PCR: NEGATIVE

## 2023-05-08 LAB — PROTIME-INR
INR: 1.3 — ABNORMAL HIGH (ref 0.8–1.2)
Prothrombin Time: 16.6 s — ABNORMAL HIGH (ref 11.4–15.2)

## 2023-05-08 LAB — I-STAT CG4 LACTIC ACID, ED: Lactic Acid, Venous: 1.7 mmol/L (ref 0.5–1.9)

## 2023-05-08 LAB — LIPASE, BLOOD: Lipase: 20 U/L (ref 11–51)

## 2023-05-08 LAB — SEDIMENTATION RATE: Sed Rate: 18 mm/h (ref 0–22)

## 2023-05-08 MED ORDER — ONDANSETRON HCL 4 MG/2ML IJ SOLN
4.0000 mg | Freq: Once | INTRAMUSCULAR | Status: AC
Start: 2023-05-08 — End: 2023-05-08
  Administered 2023-05-08: 4 mg via INTRAVENOUS
  Filled 2023-05-08: qty 2

## 2023-05-08 MED ORDER — VANCOMYCIN HCL IN DEXTROSE 1-5 GM/200ML-% IV SOLN
1000.0000 mg | Freq: Once | INTRAVENOUS | Status: AC
  Administered 2023-05-08: 1000 mg via INTRAVENOUS
  Filled 2023-05-08: qty 200

## 2023-05-08 MED ORDER — ONDANSETRON HCL 4 MG/2ML IJ SOLN: 4.0000 mg | Freq: Once | INTRAMUSCULAR | Status: DC | PRN

## 2023-05-08 MED ORDER — KETOROLAC TROMETHAMINE 15 MG/ML IJ SOLN
15.0000 mg | Freq: Once | INTRAMUSCULAR | Status: AC
  Administered 2023-05-08: 15 mg via INTRAVENOUS
  Filled 2023-05-08: qty 1

## 2023-05-08 MED ORDER — SODIUM CHLORIDE 0.9 % IV BOLUS
1000.0000 mL | Freq: Once | INTRAVENOUS | Status: AC
  Administered 2023-05-08: 1000 mL via INTRAVENOUS

## 2023-05-08 MED ORDER — LORAZEPAM 1 MG PO TABS: 2.0000 mg | ORAL_TABLET | Freq: Once | ORAL | Status: DC

## 2023-05-08 MED ORDER — SODIUM CHLORIDE 0.9 % IV SOLN
2.0000 g | Freq: Four times a day (QID) | INTRAVENOUS | Status: DC
  Administered 2023-05-08: 2 g via INTRAVENOUS
  Filled 2023-05-08: qty 12.5

## 2023-05-08 MED ORDER — ACETAMINOPHEN 325 MG PO TABS
650.0000 mg | ORAL_TABLET | Freq: Once | ORAL | Status: AC
  Administered 2023-05-08: 650 mg via ORAL
  Filled 2023-05-08: qty 2

## 2023-05-08 NOTE — ED Provider Notes (Signed)
 [place] EMERGENCY DEPARTMENT AT [facility] Provider Note   CSN: [id number] Arrival date & time: 05/08/23  1309     History  Chief Complaint  Patient presents with   Vomiting    Melissa Little is a 38 y.o. female.  HPI     38 year old female comes in with chief complaint of fevers, nausea, body aches, headaches for the last 2 days.  Patient has history of adult stills disease, rheumatoid arthritis and was diagnosed with COVID-19 on 3-27.  Her course was complicated with concerns for MAS  -she was placed on high-dose steroids and ultimately transferred to [facility].  Patient was discharged from the without further complication and is currently on prednisone  taper.  She also received Paxlovid  for her COVID.    Home Medications Prior to Admission medications   Medication Sig Start Date End Date Taking? Authorizing Provider  anakinra  (KINERET ) 100 MG/0.67ML SOSY injection Inject 100 mg into the vein daily.    [provider]  folic acid  (FOLVITE ) 400 MCG tablet Take 400 mcg by mouth daily.    [provider]  hydrOXYzine  (ATARAX ) 25 MG tablet Take 1 tablet (25 mg total) by mouth 3 (three) times daily as needed for anxiety. 04/18/23   Armenta Landau, MD  ibuprofen  (ADVIL ) 600 MG tablet Take 1 tablet (600 mg total) by mouth every 6 (six) hours. 12/04/22   Kumar, Agnijita, MD  ketorolac  (TORADOL ) 30 MG/ML injection Inject 1 mL (30 mg total) into the vein every 6 (six) hours as needed (severe pain). 04/18/23   Armenta Landau, MD  melatonin 3 MG TABS tablet Take 1 tablet (3 mg total) by mouth at bedtime. 04/18/23   Armenta Landau, MD  methylPREDNISolone  sodium succinate 1,000 mg in sodium chloride  0.9 % 50 mL Inject 1,000 mg into the vein daily. 04/18/23   Armenta Landau, MD  oxyCODONE  (OXY IR/ROXICODONE ) 5 MG immediate release tablet Take 1 tablet (5 mg total) by mouth every 6 (six) hours as needed for severe pain (pain score 7-10) or breakthrough pain.  12/04/22   Kumar, Agnijita, MD  pantoprazole  (PROTONIX ) 40 MG injection Inject 40 mg into the vein daily. 04/18/23   Armenta Landau, MD  predniSONE  (DELTASONE ) 10 MG tablet Take 1 tablet (10 mg total) by mouth daily with breakfast. 12/04/22   Melanie Spires, MD  senna-docusate (SENOKOT-S) 8.6-50 MG tablet Take 2 tablets by mouth daily. Patient not taking: Reported on 04/14/2023 12/04/22   Kumar, Agnijita, MD      Allergies    Patient has no known allergies.    Review of Systems   Review of Systems  Physical Exam Updated Vital Signs BP 105/74   Pulse 84   Temp 97.6 F (36.4 C) (Oral)   Resp 20   Ht 5\' 2"  (1.575 m)   Wt 54.4 kg   SpO2 100%   BMI 21.95 kg/m  Physical Exam  ED Results / Procedures / Treatments   Labs (all labs ordered are listed, but only abnormal results are displayed) Labs Reviewed  COMPREHENSIVE METABOLIC PANEL WITH GFR - Abnormal; Notable for the following components:      Result Value   Sodium 123 (*)    Chloride 93 (*)    CO2 18 (*)    Calcium  7.7 (*)    Total Protein 6.0 (*)    Albumin  2.6 (*)    AST 149 (*)    All other components within normal limits  CBC WITH DIFFERENTIAL/PLATELET -  Abnormal; Notable for the following components:   WBC 14.4 (*)    MCV 77.0 (*)    RDW 21.4 (*)    Platelets 86 (*)    Neutro Abs 10.7 (*)    Monocytes Absolute 1.2 (*)    Abs Immature Granulocytes 0.55 (*)    All other components within normal limits  PROTIME-INR - Abnormal; Notable for the following components:   Prothrombin Time 16.6 (*)    INR 1.3 (*)    All other components within normal limits  C-REACTIVE PROTEIN - Abnormal; Notable for the following components:   CRP 12.2 (*)    All other components within normal limits  RESP PANEL BY RT-PCR (RSV, FLU A&B, COVID)  RVPGX2  CULTURE, BLOOD (ROUTINE X 2)  CULTURE, BLOOD (ROUTINE X 2)  LIPASE, BLOOD  HCG, SERUM, QUALITATIVE  SEDIMENTATION RATE  URINALYSIS, W/ REFLEX TO CULTURE (INFECTION SUSPECTED)   PREGNANCY, URINE  I-STAT CG4 LACTIC ACID, ED  I-STAT CG4 LACTIC ACID, ED    EKG EKG Interpretation Date/Time:  Sunday May 08 2023 13:24:47 EDT Ventricular Rate:  115 PR Interval:  101 QRS Duration:  86 QT Interval:  360 QTC Calculation: 498 R Axis:   77  Text Interpretation: Sinus tachycardia Consider right atrial enlargement Consider left ventricular hypertrophy Borderline prolonged QT interval No acute changes No significant change since last tracing Confirmed by Deatra Face ([id number]) on 05/08/2023 2:07:55 PM  Radiology DG Chest Port 1 View Result Date: 05/08/2023 CLINICAL DATA:  Questionable sepsis - evaluate for abnormality EXAM: PORTABLE CHEST 1 VIEW COMPARISON:  Chest x-ray 04/16/2023. FINDINGS: The heart and mediastinal contours are within normal limits. No focal consolidation. No pulmonary edema. No pleural effusion. No pneumothorax. No acute osseous abnormality. IMPRESSION: No active disease. Electronically Signed   By: Morgane  Naveau M.D.   On: 05/08/2023 15:37    Procedures Procedures    Medications Ordered in ED Medications  ondansetron  (ZOFRAN ) injection 4 mg (has no administration in time range)  ceFEPIme  (MAXIPIME ) 2 g in sodium chloride  0.9 % 100 mL IVPB (2 g Intravenous New Bag/Given 05/08/23 1750)  vancomycin  (VANCOCIN ) IVPB 1000 mg/200 mL premix (1,000 mg Intravenous New Bag/Given 05/08/23 1754)  sodium chloride  0.9 % bolus 1,000 mL (0 mLs Intravenous Stopped 05/08/23 1542)  ondansetron  (ZOFRAN ) injection 4 mg (4 mg Intravenous Given 05/08/23 1424)  acetaminophen  (TYLENOL ) tablet 650 mg (650 mg Oral Given 05/08/23 1424)  ketorolac  (TORADOL ) 15 MG/ML injection 15 mg (15 mg Intravenous Given 05/08/23 1645)    ED Course/ Medical Decision Making/ A&P                                 Medical Decision Making Amount and/or Complexity of Data Reviewed Labs: ordered. Radiology: ordered.  Risk OTC drugs. Prescription drug management.   38 year old patient  comes in with chief complaint of fevers, body aches, nausea.  She recently was admitted to the hospital for COVID and subsequently was thought to have either developed flareup of stills disease or had macrophage activation syndrome.  Patient was discharged after receiving Paxlovid  and currently is being tapered off of prednisone .  She is currently taking 40 mg prednisone  daily.  I reviewed patient's records including Care Everywhere records and discharge summary from [facility] and [facility].  Patient noted to have cervical lymphadenopathy.  She has no other concerning findings.  She is complaining of joint ache, body aches, fevers, nausea and vomiting.  Differential diagnosis in her includes flareup of stills disease, rebound COVID, bacteremia.  Other possibilities include occult sepsis.  Basic labs ordered including blood cultures and inflammatory markers.  I have reviewed the labs independently.  Sed rate is normal, but CRP is high.  Patient has sodium of 123.  She has elevated white count.  Elevated white count could be because of the prednisone .  LFTs are slightly elevated.  Plan is to call medicine for admission.  Reassessment: Medicine team at [facility] requested that we speak with [facility].  I spoke with Dr. Spector, who has accepted the patient. She did request consideration for broad-spectrum antibiotics, which I have obliged to.  Plan has been discussed with the patient who is also comfortable with it.  She is already on 40 mg prednisone .  Will defer additional steroids to admitting team.   Final Clinical Impression(s) / ED Diagnoses Final diagnoses:  SIRS (systemic inflammatory response syndrome) (HCC)  Still's disease (HCC)    Rx / DC Orders ED Discharge Orders     None         Deatra Face, MD 05/08/23 1813

## 2023-05-08 NOTE — ED Notes (Signed)
[facility] Called

## 2023-05-08 NOTE — ED Notes (Signed)
 Called [employer], faxed FS, requested Imaging push from RAD, transferred call to Dr Lewis Red.

## 2023-05-08 NOTE — ED Triage Notes (Signed)
 BIB EMS for NV x 2 days, body aches/headache. Recently dx with COVID x 2 wks ago. Hx of Stills disease. VSS with EMS

## 2023-05-08 NOTE — ED Notes (Signed)
 Called [facility] for report at [phone]. Secretary unable to find primary nurse for report. To give primary nurse this RN's phone number to get report.

## 2023-05-08 NOTE — Progress Notes (Signed)
 ED Pharmacy Antibiotic Sign Off An antibiotic consult was received from an ED provider for vancomycin  per pharmacy dosing for sepsis. A chart review was completed to assess appropriateness.   The following one time order(s) were placed:  Vancomycin  1g Iv x 1  Further antibiotic and/or antibiotic pharmacy consults should be ordered by the admitting provider if indicated.   Thank you for allowing pharmacy to be a part of this patient's care.   Trinidad Funk, [facility]  Clinical Pharmacist 05/08/23 5:55 PM

## 2023-05-13 LAB — CULTURE, BLOOD (ROUTINE X 2)
Culture: NO GROWTH
Culture: NO GROWTH

## 2023-06-20 NOTE — Hospice Plan of Care (Addendum)
 ORDERS:  AIDE SERVICE: 24 HOURS / 7 DAYS WEEKLY FOR DAILY LIVING NEEDS     Routine, Until discontinued, Starting on Mon 06/20/23 at 1428, Until Specified, Aide Service: 24 hours / 7 days weekly for daily living needs     Start Date: 06/20/2023   CONTACT/SPECIAL ENTERIC ISOLATION STATUS     Instructions: Use soap and water  for hand hygiene, Comments: For questions, contact infection prevention specialist on call, Indication for Contact/Special Enteric: C Diff, Isolation order generated by Infection/Isolation mismatch BPA     Start Date: 06/20/2023   DIET REGULAR     Diet effective now, Starting on Mon 06/20/23 at 1428, Until Specified     Start Date: 06/20/2023   DNAR (DO NOT ATTEMPT RESUSCITATION)     DECISION-MAKER (listed in ORDER of LEGAL PRIORITY - see INSTRUCTIONS): 7. Majority of reasonably available parents & adult (18+) children, Name(s) / Relationship(s): Patient's father and mother, Content of Discussion with patient/patient's legal representative: Given severity of intraparenchymal hemorrhage, family elects for DNAR order     Start Date: 06/20/2023   MEDICAL SOCIAL WORKER SERVICE: DAILY AS NEEDED FOR PSYCHOSOCIAL CARE NEEDS     Routine, Until discontinued, Starting on Mon 06/20/23 at 1428, Until Specified, Medical Social Worker Service: Daily as needed for psychosocial care needs     Start Date: 06/20/2023   MISC NURSING: MAINTAIN RECTAL TUBE     Routine, Until discontinued, Starting on Mon 06/20/23 at 1550, Until Specified, Instructions: Maintain rectal tube     Start Date: 06/20/2023   NOTIFY PROVIDER FOR:     Routine, Until discontinued, Starting on Mon 06/20/23 at 1428, Until Specified, Notify for: uncontrolled pain, dyspnea, or other symptoms.     Start Date: 06/20/2023   NURSING     Routine, Until discontinued, Starting on Mon 06/20/23 at 1428, Until Specified, Hospice oxygen therapy? Yes, Instructions: Titrate flow rate via 0.5L/min up to 4L/min as needed for increased SOB, comfort  and/or symptom management. May change NP to a mask if patient is not breathing though nose or NP straps are causing irritation or wounds to face or behind ears., Oxygen delivery method: Nasal Cannula, Liters per minute: 4, Indications for O2 therapy: Shortness of breath     Start Date: 06/20/2023   NURSING SERVICE: 24 HOURS / 7 DAYS WEEKLY FOR NURSING CARE NEEDS     Routine, Until discontinued, Starting on Mon 06/20/23 at 1428, Until Specified, Nursing Service: 24 hours / 7 days weekly for nursing care needs     Start Date: 06/20/2023   NURSING: DO NOT WAKE FOR VITALS     Routine, Until discontinued, Starting on Mon 06/20/23 at 1428, Until Specified, Instructions: Do not wake for vitals     Start Date: 06/20/2023   NURSING: PLEASURE FEEDING AS TOLERATED     Routine, Until discontinued, Starting on Mon 06/20/23 at 1428, Until Specified, Instructions: Pleasure feeding as tolerated     Start Date: 06/20/2023   ORAL CARE     Routine, Every 4 hours, First occurrence on Mon 06/20/23 at 1600     Start Date: 06/20/2023   PERIPHERAL IV ACCESS, OBTAIN & MAINTAIN- ADULT     Routine, Until discontinued, Starting on Mon 06/20/23 at 1428, Until Specified, Size: Per Nurse, Location: Per Nurse     Start Date: 06/20/2023   PHYSICIAN SERVICE: 6X WEEKLY AND PRN FOR SYMPTOM MANAGEMENT     Routine, Until discontinued, Starting on Mon 06/20/23 at 1428, Until Specified, Physician Service: 6x weekly and PRN  for symptom management     Start Date: 06/20/2023   [facility] CARE SERVICE: DAILY AS NEEDED FOR SPIRITUAL CARE NEEDS     Routine, Until discontinued, Starting on Mon 06/20/23 at 1428, Until Specified, Spiritual Care Service: Daily as needed for spiritual care needs     Start Date: 06/20/2023   SUCTION - ORAL; FOR EXCESS SECRETIONS VIA YANKAUER AVOID NASOTRACHEAL SUCTIONING IF AT ALL POSSIBLE.     Routine, As needed, Starting on Mon 06/20/23 at 1427, Until Specified, Body Site: Oral, Instructions: for excess secretions via  Y[place]auer avoid nasotracheal suctioning if at all possible.     Start Date: 06/20/2023   URINARY CATHETER INSERT & MAINTAIN COMFORT CARE /END OF LIFE     Routine, As directed, Starting on Mon 06/20/23 at 1428, Until Specified, Indications: Comfort care /End of life, Removal: Daily assessment of necessity per RN-Driven Foley Removal Protocol, Size: Per Nurse     Start Date: 06/20/2023   VITAL SIGNS     Routine, Daily at 0900, First occurrence on Tue 06/21/23 at 0900     Start Date: 06/21/2023   VOLUNTEER SERVICES AS OFFERED OR REQUESTED     Routine, Until discontinued, Starting on Mon 06/20/23 at 1428, Until Specified, Teacher, music as offered or requested     Start Date: 06/20/2023
